# Patient Record
Sex: Male | Born: 1950 | Race: White | Hispanic: No | Marital: Married | Smoking: Never smoker
Health system: Southern US, Community
[De-identification: ages and names within clinical notes are randomized; demographics above are authoritative.]

## PROBLEM LIST (undated history)

## (undated) DIAGNOSIS — G4733 Obstructive sleep apnea (adult) (pediatric): Secondary | ICD-10-CM

## (undated) DIAGNOSIS — I10 Essential (primary) hypertension: Secondary | ICD-10-CM

## (undated) DIAGNOSIS — K219 Gastro-esophageal reflux disease without esophagitis: Secondary | ICD-10-CM

## (undated) DIAGNOSIS — D649 Anemia, unspecified: Secondary | ICD-10-CM

## (undated) DIAGNOSIS — F32A Depression, unspecified: Secondary | ICD-10-CM

## (undated) DIAGNOSIS — M1990 Unspecified osteoarthritis, unspecified site: Secondary | ICD-10-CM

## (undated) DIAGNOSIS — G2581 Restless legs syndrome: Secondary | ICD-10-CM

## (undated) DIAGNOSIS — E669 Obesity, unspecified: Secondary | ICD-10-CM

## (undated) DIAGNOSIS — R600 Localized edema: Secondary | ICD-10-CM

## (undated) DIAGNOSIS — F329 Major depressive disorder, single episode, unspecified: Secondary | ICD-10-CM

## (undated) DIAGNOSIS — I4891 Unspecified atrial fibrillation: Secondary | ICD-10-CM

## (undated) DIAGNOSIS — I499 Cardiac arrhythmia, unspecified: Secondary | ICD-10-CM

## (undated) DIAGNOSIS — C801 Malignant (primary) neoplasm, unspecified: Secondary | ICD-10-CM

## (undated) HISTORY — PX: KNEE SURGERY: SHX244

## (undated) HISTORY — PX: OTHER SURGICAL HISTORY: SHX169

## (undated) HISTORY — PX: BACK SURGERY: SHX140

---
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
## 2007-04-09 ENCOUNTER — Ambulatory Visit: Payer: Self-pay | Admitting: General Practice

## 2007-04-29 ENCOUNTER — Ambulatory Visit: Payer: Self-pay | Admitting: General Practice

## 2007-05-19 ENCOUNTER — Ambulatory Visit: Payer: Self-pay | Admitting: General Practice

## 2007-11-09 ENCOUNTER — Emergency Department: Payer: Self-pay | Admitting: Emergency Medicine

## 2007-11-09 ENCOUNTER — Other Ambulatory Visit: Payer: Self-pay

## 2008-03-29 ENCOUNTER — Ambulatory Visit: Payer: Self-pay | Admitting: General Practice

## 2008-04-25 ENCOUNTER — Ambulatory Visit: Payer: Self-pay | Admitting: General Practice

## 2009-09-12 ENCOUNTER — Ambulatory Visit: Payer: Self-pay | Admitting: General Practice

## 2010-03-15 ENCOUNTER — Ambulatory Visit: Payer: Self-pay | Admitting: General Practice

## 2012-09-12 ENCOUNTER — Emergency Department: Payer: Self-pay | Admitting: *Deleted

## 2016-02-04 ENCOUNTER — Inpatient Hospital Stay
Admission: EM | Admit: 2016-02-04 | Discharge: 2016-02-05 | DRG: 309 | Disposition: A | Discharge status: Home / Self Care | Payer: BLUE CROSS/BLUE SHIELD | Attending: Internal Medicine | Admitting: Internal Medicine

## 2016-02-04 ENCOUNTER — Encounter: Payer: Self-pay | Admitting: Emergency Medicine

## 2016-02-04 ENCOUNTER — Emergency Department: Payer: BLUE CROSS/BLUE SHIELD

## 2016-02-04 DIAGNOSIS — Z6841 Body Mass Index (BMI) 40.0 and over, adult: Secondary | ICD-10-CM

## 2016-02-04 DIAGNOSIS — R002 Palpitations: Secondary | ICD-10-CM | POA: Diagnosis present

## 2016-02-04 DIAGNOSIS — I493 Ventricular premature depolarization: Secondary | ICD-10-CM | POA: Diagnosis present

## 2016-02-04 DIAGNOSIS — G4733 Obstructive sleep apnea (adult) (pediatric): Secondary | ICD-10-CM | POA: Diagnosis present

## 2016-02-04 DIAGNOSIS — I10 Essential (primary) hypertension: Secondary | ICD-10-CM | POA: Diagnosis present

## 2016-02-04 DIAGNOSIS — I4891 Unspecified atrial fibrillation: Principal | ICD-10-CM | POA: Diagnosis present

## 2016-02-04 DIAGNOSIS — K219 Gastro-esophageal reflux disease without esophagitis: Secondary | ICD-10-CM | POA: Diagnosis present

## 2016-02-04 DIAGNOSIS — E669 Obesity, unspecified: Secondary | ICD-10-CM | POA: Diagnosis present

## 2016-02-04 DIAGNOSIS — F329 Major depressive disorder, single episode, unspecified: Secondary | ICD-10-CM | POA: Diagnosis present

## 2016-02-04 HISTORY — DX: Obstructive sleep apnea (adult) (pediatric): G47.33

## 2016-02-04 HISTORY — DX: Gastro-esophageal reflux disease without esophagitis: K21.9

## 2016-02-04 HISTORY — DX: Major depressive disorder, single episode, unspecified: F32.9

## 2016-02-04 HISTORY — DX: Depression, unspecified: F32.A

## 2016-02-04 LAB — COMPREHENSIVE METABOLIC PANEL
ALT: 18 U/L (ref 17–63)
AST: 23 U/L (ref 15–41)
Albumin: 4.1 g/dL (ref 3.5–5.0)
Alkaline Phosphatase: 69 U/L (ref 38–126)
Anion gap: 4 — ABNORMAL LOW (ref 5–15)
BUN: 15 mg/dL (ref 6–20)
CHLORIDE: 105 mmol/L (ref 101–111)
CO2: 27 mmol/L (ref 22–32)
Calcium: 8.8 mg/dL — ABNORMAL LOW (ref 8.9–10.3)
Creatinine, Ser: 1.14 mg/dL (ref 0.61–1.24)
Glucose, Bld: 109 mg/dL — ABNORMAL HIGH (ref 65–99)
POTASSIUM: 3.8 mmol/L (ref 3.5–5.1)
SODIUM: 136 mmol/L (ref 135–145)
Total Bilirubin: 1 mg/dL (ref 0.3–1.2)
Total Protein: 6.7 g/dL (ref 6.5–8.1)

## 2016-02-04 LAB — CBC
HEMATOCRIT: 50.6 % (ref 40.0–52.0)
Hemoglobin: 17.8 g/dL (ref 13.0–18.0)
MCH: 32.7 pg (ref 26.0–34.0)
MCHC: 35.1 g/dL (ref 32.0–36.0)
MCV: 93.2 fL (ref 80.0–100.0)
PLATELETS: 232 10*3/uL (ref 150–440)
RBC: 5.43 MIL/uL (ref 4.40–5.90)
RDW: 14.4 % (ref 11.5–14.5)
WBC: 7.5 10*3/uL (ref 3.8–10.6)

## 2016-02-04 LAB — TROPONIN I: Troponin I: <0.03 ng/mL (ref <0.031)

## 2016-02-04 LAB — LIPID PANEL
Cholesterol: 133 mg/dL (ref 0–200)
HDL: 32 mg/dL — ABNORMAL LOW (ref >40)
LDL CALC: 44 mg/dL (ref 0–99)
Total CHOL/HDL Ratio: 4.2 RATIO
Triglycerides: 285 mg/dL — ABNORMAL HIGH (ref <150)
VLDL: 57 mg/dL — ABNORMAL HIGH (ref 0–40)

## 2016-02-04 LAB — TSH: TSH: 3.486 u[IU]/mL (ref 0.350–4.500)

## 2016-02-04 LAB — APTT: APTT: 28 s (ref 24–36)

## 2016-02-04 LAB — PROTIME-INR
INR: 1.12
Prothrombin Time: 14.6 seconds (ref 11.4–15.0)

## 2016-02-04 MED ORDER — DILTIAZEM HCL 30 MG PO TABS
30.0000 mg | ORAL_TABLET | Freq: Four times a day (QID) | ORAL | Status: DC
  Administered 2016-02-04 – 2016-02-05 (×4): 30 mg via ORAL
  Filled 2016-02-04 (×3): qty 1

## 2016-02-04 MED ORDER — PANTOPRAZOLE SODIUM 40 MG PO TBEC
40.0000 mg | DELAYED_RELEASE_TABLET | Freq: Every day | ORAL | Status: DC
  Administered 2016-02-05: 40 mg via ORAL
  Filled 2016-02-04: qty 1

## 2016-02-04 MED ORDER — ONDANSETRON HCL 4 MG PO TABS: 4.0000 mg | ORAL_TABLET | Freq: Four times a day (QID) | ORAL | Status: DC | PRN

## 2016-02-04 MED ORDER — SENNOSIDES-DOCUSATE SODIUM 8.6-50 MG PO TABS: 1.0000 | ORAL_TABLET | Freq: Every evening | ORAL | Status: DC | PRN

## 2016-02-04 MED ORDER — ASPIRIN EC 325 MG PO TBEC: 975.0000 mg | DELAYED_RELEASE_TABLET | Freq: Every day | ORAL | Status: DC

## 2016-02-04 MED ORDER — ACETAMINOPHEN 650 MG RE SUPP: 650.0000 mg | Freq: Four times a day (QID) | RECTAL | Status: DC | PRN

## 2016-02-04 MED ORDER — DIGOXIN 0.25 MG/ML IJ SOLN: 0.2500 mg | Freq: Once | INTRAMUSCULAR | Status: DC

## 2016-02-04 MED ORDER — DILTIAZEM HCL 25 MG/5ML IV SOLN
25.0000 mg | Freq: Once | INTRAVENOUS | Status: AC
  Administered 2016-02-04: 25 mg via INTRAVENOUS
  Filled 2016-02-04: qty 5

## 2016-02-04 MED ORDER — SODIUM CHLORIDE 0.9% FLUSH
3.0000 mL | Freq: Two times a day (BID) | INTRAVENOUS | Status: DC
  Administered 2016-02-04 – 2016-02-05 (×2): 3 mL via INTRAVENOUS

## 2016-02-04 MED ORDER — ALUM & MAG HYDROXIDE-SIMETH 200-200-20 MG/5ML PO SUSP: 30.0000 mL | Freq: Four times a day (QID) | ORAL | Status: DC | PRN

## 2016-02-04 MED ORDER — HYDROCODONE-ACETAMINOPHEN 5-325 MG PO TABS: 1.0000 | ORAL_TABLET | ORAL | Status: DC | PRN

## 2016-02-04 MED ORDER — PANTOPRAZOLE SODIUM 40 MG PO TBEC: 40.0000 mg | DELAYED_RELEASE_TABLET | Freq: Every day | ORAL | Status: DC

## 2016-02-04 MED ORDER — ENOXAPARIN SODIUM 40 MG/0.4ML ~~LOC~~ SOLN
40.0000 mg | SUBCUTANEOUS | Status: DC
  Administered 2016-02-04: 40 mg via SUBCUTANEOUS
  Filled 2016-02-04: qty 0.4

## 2016-02-04 MED ORDER — BUPROPION HCL ER (XL) 300 MG PO TB24
300.0000 mg | ORAL_TABLET | Freq: Every day | ORAL | Status: DC
  Administered 2016-02-05: 300 mg via ORAL
  Filled 2016-02-04: qty 1

## 2016-02-04 MED ORDER — DILTIAZEM HCL ER 60 MG PO CP12
60.0000 mg | ORAL_CAPSULE | Freq: Two times a day (BID) | ORAL | Status: DC
  Filled 2016-02-04: qty 1

## 2016-02-04 MED ORDER — DILTIAZEM HCL 60 MG PO TABS
60.0000 mg | ORAL_TABLET | Freq: Once | ORAL | Status: DC
  Filled 2016-02-04: qty 2

## 2016-02-04 MED ORDER — ASPIRIN EC 325 MG PO TBEC
325.0000 mg | DELAYED_RELEASE_TABLET | Freq: Every day | ORAL | Status: DC
  Administered 2016-02-04: 325 mg via ORAL
  Filled 2016-02-04: qty 1

## 2016-02-04 MED ORDER — ONDANSETRON HCL 4 MG/2ML IJ SOLN: 4.0000 mg | Freq: Four times a day (QID) | INTRAMUSCULAR | Status: DC | PRN

## 2016-02-04 MED ORDER — DILTIAZEM HCL 100 MG IV SOLR
5.0000 mg/h | INTRAVENOUS | Status: DC
  Filled 2016-02-04: qty 100

## 2016-02-04 MED ORDER — ACETAMINOPHEN 325 MG PO TABS: 650.0000 mg | ORAL_TABLET | Freq: Four times a day (QID) | ORAL | Status: DC | PRN

## 2016-02-04 NOTE — ED Provider Notes (Signed)
[facility] Emergency Department Provider Note  ____________________________________________    I have reviewed the triage vital signs and the nursing notes.   HISTORY  Chief Complaint Palpitations    HPI Benjamin [name] is a 65 y.o. male who presents with complaints of heart palpitations. 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 This morning he felt unwell and relatively weak and dizzy and felt that his heart rate was faster than normal. He denies chest pain. He denies recent alcohol abuse. No recent travel. No calf pain or swelling. No shortness of breath.     Past Medical History  Diagnosis Date  . Depression   . GERD (gastroesophageal reflux disease)     There are no active problems to display for this patient.   History reviewed. No pertinent past surgical history.  Current Outpatient Rx  Name  Route  Sig  Dispense  Refill  . buPROPion (WELLBUTRIN XL) 150 MG 24 hr tablet   Oral   Take 300 mg by mouth daily.      4   . pantoprazole (PROTONIX) 40 MG tablet   Oral   Take 40 mg by mouth daily.      5     Allergies Review of patient's allergies indicates no known allergies.  History reviewed. No pertinent family history.  Social History Social History  Substance Use Topics  . Smoking status: Never Smoker   . Smokeless tobacco: None  . Alcohol Use: Yes    Review of Systems  Constitutional: Negative for fever. Eyes: Negative for redness ENT: Negative for sore throat Cardiovascular: Negative for chest pain. Positive for palpitations Respiratory: Negative for shortness of breath. Gastrointestinal: Negative for abdominal pain Genitourinary: Negative for dysuria. Musculoskeletal: Negative for back pain. Skin: Negative for rash. Neurological: Negative for focal weakness Psychiatric: no  anxiety    ____________________________________________   PHYSICAL EXAM:  VITAL SIGNS: ED Triage Vitals  Enc Vitals Group     BP 02/04/16 1409 178/99 mmHg     Pulse Rate 02/04/16 1409 167     Resp 02/04/16 1409 20     Temp 02/04/16 1409 98.2 F (36.8 C)     Temp Source 02/04/16 1409 Oral     SpO2 02/04/16 1409 99 %     Weight 02/04/16 1409 289 lb 6.4 oz (131.271 kg)     Height 02/04/16 1409 5\' 10"  (1.778 m)     Head Cir --      Peak Flow --      Pain Score --      Pain Loc --      Pain Edu? --      Excl. in [place]? --      Constitutional: Alert and oriented. Well appearing and in no distress.  Eyes: Conjunctivae are normal. No erythema or injection ENT   Head: Normocephalic and atraumatic.   Mouth/Throat: Mucous membranes are moist. Cardiovascular: Tachycardia, irregularly irregular rhythm..  symmetric distal pulses are present in the upper extremities. No murmurs or rubs  Respiratory: Normal respiratory effort without tachypnea nor retractions. Breath sounds are clear and equal bilaterally.  Gastrointestinal: Soft and non-tender in all quadrants. No distention. There is no CVA tenderness. Genitourinary: deferred Musculoskeletal: Nontender with normal range of motion in all extremities. No lower extremity tenderness nor edema. Neurologic:  Normal speech and language. No gross focal neurologic deficits are appreciated. Skin:  Skin is warm, dry  and intact. No rash noted. Psychiatric: Mood and affect are normal. Patient exhibits appropriate insight and judgment.  ____________________________________________    LABS (pertinent positives/negatives)  Labs Reviewed  COMPREHENSIVE METABOLIC PANEL - Abnormal; Notable for the following:    Glucose, Bld 109 (*)    Calcium 8.8 (*)    Anion gap 4 (*)    All other components within normal limits  CBC  TROPONIN I  APTT  PROTIME-INR    ____________________________________________   EKG  ED ECG REPORT I, Lavonia Drafts, the attending physician, personally viewed and interpreted this ECG.   Date: 02/04/2016  EKG Time: 2:13 PM  Rate: 152  Rhythm: atrial fibrillation, rate 152  Axis: Normal  Intervals:a fib  ST&T Change:  Nonspecific ____________________________________________    RADIOLOGY  Chest x-ray unremarkable  ____________________________________________   PROCEDURES  Procedure(s) performed: none  Critical Care performed: yes  CRITICAL CARE Performed by: Lavonia Drafts   Total critical care time: 30 minutes  Critical care time was exclusive of separately billable procedures and treating other patients.  Critical care was necessary to treat or prevent imminent or life-threatening deterioration.  Critical care was time spent personally by me on the following activities: development of treatment plan with patient and/or surrogate as well as nursing, discussions with consultants, evaluation of patient's response to treatment, examination of patient, obtaining history from patient or surrogate, ordering and performing treatments and interventions, ordering and review of laboratory studies, ordering and review of radiographic studies, pulse oximetry and re-evaluation of patient's condition.  ____________________________________________   INITIAL IMPRESSION / ASSESSMENT AND PLAN / ED COURSE  Pertinent labs & imaging results that were available during my care of the patient were reviewed by me and considered in my medical decision making (see chart for details).  Patient presents with weakness and dizziness and palpitations. EKG shows new onset atrial fibrillation with RVR. Cardizem 25 mg IV given with good reduction in rate but continues to be in atrial fibrillation. I will admit the patient to the hospital given unknown onset of atrial fibrillation.  ____________________________________________   FINAL CLINICAL IMPRESSION(S) / ED DIAGNOSES  Final diagnoses:  Atrial  fibrillation with RVR (HCC)          Lavonia Drafts, MD 02/04/16 1513

## 2016-02-04 NOTE — Progress Notes (Signed)
Per MD Mody do NOT continue 975 ASA in hospital, change to 325.  OK to give w/Lovenox

## 2016-02-04 NOTE — H&P (Signed)
Benjamin [name] at [place] NAME: Benjamin [name]    MR#:  [id number]  DATE OF BIRTH:  09-14-1951  DATE OF ADMISSION:  02/04/2016  PRIMARY CARE PHYSICIAN: Dr. Corinda Gub[facility] at [facility]   REQUESTING/REFERRING PHYSICIAN: Dr Corky Downs  CHIEF COMPLAINT:  New onset atrial fibrillation HISTORY OF PRESENT ILLNESS:  Benjamin [name]  is a 65 y.o. male with a known history of Depression and OSA on CPAP who presents with above complaint. Patient reports that this morning he thought he had PVCs as he does have a history of PVCs. He was feeling dizzy and felt like his heart was having palpitations. He presents to the ER where he was diagnosed with new onset atrial fibrillation. His heart rates were in the 160s and has been given IV diltiazem. His heart rate is less than 100 currently. Patient denies chest pain, shortness of breath, recent illness.  PAST MEDICAL HISTORY:   Past Medical History  Diagnosis Date  . Depression   . GERD (gastroesophageal reflux disease)   OSA  PAST SURGICAL HISTORY:  None  SOCIAL HISTORY:   Social History  Substance Use Topics  . Smoking status: Never Smoker   . Smokeless tobacco: no  . Alcohol Use: Yes    FAMILY HISTORY:  Family with cancer  DRUG ALLERGIES:  No Known Allergies   REVIEW OF SYSTEMS:  CONSTITUTIONAL: No fever, fatigue or weakness.  EYES: No blurred or double vision.  EARS, NOSE, AND THROAT: No tinnitus or ear pain.  RESPIRATORY: No cough, shortness of breath, wheezing or hemoptysis.  CARDIOVASCULAR: No chest pain, orthopnea, edema. Positive palpitations with dizziness GASTROINTESTINAL: No nausea, vomiting, diarrhea or abdominal pain.  GENITOURINARY: No dysuria, hematuria.  ENDOCRINE: No polyuria, nocturia,  HEMATOLOGY: No anemia, easy bruising or bleeding SKIN: No rash or lesion. MUSCULOSKELETAL: No joint pain or arthritis.   NEUROLOGIC: No tingling, numbness, weakness.  PSYCHIATRY: No  anxiety or depression.   MEDICATIONS AT HOME:   Prior to Admission medications   Medication Sig Start Date End Date Taking? Authorizing Provider  aspirin 325 MG EC tablet Take 975 mg by mouth at bedtime.   Yes Historical Provider, MD  buPROPion (WELLBUTRIN XL) 150 MG 24 hr tablet Take 300 mg by mouth daily. 01/20/16  Yes Historical Provider, MD  ibuprofen (ADVIL,MOTRIN) 200 MG tablet Take 600 mg by mouth every morning.   Yes Historical Provider, MD  pantoprazole (PROTONIX) 40 MG tablet Take 40 mg by mouth daily. 01/23/16  Yes Historical Provider, MD      VITAL SIGNS:  Blood pressure 178/99, pulse 167, temperature 98.2 F (36.8 C), temperature source Oral, resp. rate 20, height 5\' 10"  (1.778 m), weight 131.271 kg (289 lb 6.4 oz), SpO2 99 %.  PHYSICAL EXAMINATION:  GENERAL:  65 y.o.-year-old patient lying in the bed with no acute distress.  EYES: Pupils equal, round, reactive to light and accommodation. No scleral icterus. Extraocular muscles intact.  HEENT: Head atraumatic, normocephalic. Oropharynx and nasopharynx clear.  NECK:  Supple, no jugular venous distention. No thyroid enlargement, no tenderness.  LUNGS: Normal breath sounds bilaterally, no wheezing, rales,rhonchi or crepitation. No use of accessory muscles of respiration.  CARDIOVASCULAR: Irregular, irregular No murmurs, rubs, or gallops.  ABDOMEN: Soft, nontender, nondistended. Bowel sounds present. No organomegaly or mass.  EXTREMITIES: No pedal edema, cyanosis, or clubbing.  NEUROLOGIC: Cranial nerves II through XII are grossly intact. No focal deficits. PSYCHIATRIC: The patient is alert and oriented x 3.  SKIN: No obvious  rash, lesion, or ulcer.   LABORATORY PANEL:   CBC  Recent Labs Lab 02/04/16 1415  WBC 7.5  HGB 17.8  HCT 50.6  PLT 232   ------------------------------------------------------------------------------------------------------------------  Chemistries   Recent Labs Lab 02/04/16 1415  NA 136  K  3.8  CL 105  CO2 27  GLUCOSE 109*  BUN 15  CREATININE 1.14  CALCIUM 8.8*  AST 23  ALT 18  ALKPHOS 69  BILITOT 1.0   ------------------------------------------------------------------------------------------------------------------  Cardiac Enzymes  Recent Labs Lab 02/04/16 1415  TROPONINI <0.03   ------------------------------------------------------------------------------------------------------------------  RADIOLOGY:  Dg Chest Portable 1 View  02/04/2016  CLINICAL DATA:  Atrial fibrillation EXAM: PORTABLE CHEST 1 VIEW COMPARISON:  None. FINDINGS: The heart size and mediastinal contours are within normal limits. Both lungs are clear. The visualized skeletal structures are unremarkable. IMPRESSION: No active disease. Electronically Signed   By: Inez Catalina M.D.   On: 02/04/2016 14:59    EKG:   A fib HR 152 No ST elevation or depression  IMPRESSION AND PLAN:   65 y/o male with OSA and depression here with new onset Atrial Fibrillation.  1. New onset Atrial Fibrillation: Patient has responded to IV diltiazem. By mouth diltiazem, if needed then will need to use IV diltiazem Order echocardiogram. Ordered TSH. CHADS  score of 0  2. OSA: Patient failed bring in CPAP.  3. Depression: Continue Wellbutrin.  4. GERD continue PPI.1 All the records are reviewed and case discussed with ED provider. Management plans discussed with the patient and  is in agreement.  CODE STATUS: FULL  TOTAL TIME TAKING CARE OF THIS PATIENT: 50 minutes.    Graci Hulce M.D on 02/04/2016 at 3:53 PM  Between 7am to 6pm - Pager - [phone] After 6pm go to [email or web address] - password [email or web address] [facility]  Office  [phone]  CC: Primary care physician; Pcp Not In System

## 2016-02-04 NOTE — Progress Notes (Signed)
Per Mody stop SR Cardizem and start 30 mg Q6H, give first dose now for elevated HR

## 2016-02-04 NOTE — ED Notes (Signed)
Pt presents to ED with heart palpitations that started last night. Pt states "it was doing this when I woke up this morning". Pt states he has dizziness when this happens. Pt is a/o x4, in  No acute distress

## 2016-02-05 ENCOUNTER — Inpatient Hospital Stay
Admit: 2016-02-05 | Discharge: 2016-02-05 | Disposition: A | Discharge status: Home / Self Care | Payer: BLUE CROSS/BLUE SHIELD | Attending: Internal Medicine | Admitting: Internal Medicine

## 2016-02-05 LAB — BASIC METABOLIC PANEL
Anion gap: 2 — ABNORMAL LOW (ref 5–15)
BUN: 13 mg/dL (ref 6–20)
CHLORIDE: 107 mmol/L (ref 101–111)
CO2: 28 mmol/L (ref 22–32)
CREATININE: 1.15 mg/dL (ref 0.61–1.24)
Calcium: 8.4 mg/dL — ABNORMAL LOW (ref 8.9–10.3)
GFR calc Af Amer: >60 mL/min (ref >60)
GFR calc non Af Amer: >60 mL/min (ref >60)
Glucose, Bld: 99 mg/dL (ref 65–99)
Potassium: 3.8 mmol/L (ref 3.5–5.1)
SODIUM: 137 mmol/L (ref 135–145)

## 2016-02-05 LAB — ECHOCARDIOGRAM COMPLETE
Height: 70 in
Weight: 4468.8 oz

## 2016-02-05 LAB — TROPONIN I: Troponin I: <0.03 ng/mL (ref <0.031)

## 2016-02-05 MED ORDER — DILTIAZEM HCL ER COATED BEADS 120 MG PO CP24
120.0000 mg | ORAL_CAPSULE | Freq: Every day | ORAL | Status: DC
  Administered 2016-02-05: 120 mg via ORAL
  Filled 2016-02-05: qty 1

## 2016-02-05 MED ORDER — DILTIAZEM HCL ER COATED BEADS 120 MG PO CP24: 120.0000 mg | ORAL_CAPSULE | Freq: Every day | ORAL | Status: DC

## 2016-02-05 MED ORDER — ENOXAPARIN SODIUM 40 MG/0.4ML ~~LOC~~ SOLN
40.0000 mg | Freq: Two times a day (BID) | SUBCUTANEOUS | Status: DC
  Administered 2016-02-05: 40 mg via SUBCUTANEOUS
  Filled 2016-02-05: qty 0.4

## 2016-02-05 NOTE — Consult Note (Signed)
Reason for Consult: AFIB new Referring Physician: Dr Benjie Karvonen hospitalist/[facility] primary  Benjamin [name] is an 65 y.o. male.  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 Pt c/o of recent palpitation sweating and SOB. He also c/o CP. He came to the hospital and was found to have AFIB RVR.  He has a history of PVCs which got better with CPAP. He has had multiple episodes of sweating. The pt has had possible RLS. He feels better now. Vertigo has been recurrent over the last few weeks. He c/o of chest pressure in mid chest.  Past Medical History  Diagnosis Date  . Depression   . GERD (gastroesophageal reflux disease)   . OSA (obstructive sleep apnea)     History reviewed. No pertinent past surgical history.  History reviewed. No pertinent family history.  Social History:  reports that he has never smoked. He does not have any smokeless tobacco history on file. He reports that he drinks alcohol. He reports that he does not use illicit drugs.  Allergies: No Known Allergies  Medications: I have reviewed the patient's current medications.  Results for orders placed or performed during the hospital encounter of 02/04/16 (from the past 48 hour(s))  CBC     Status: None   Collection Time: 02/04/16  2:15 PM  Result Value Ref Range   WBC 7.5 3.8 - 10.6 K/uL   RBC 5.43 4.40 - 5.90 MIL/uL   Hemoglobin 17.8 13.0 - 18.0 g/dL   HCT 50.6 40.0 - 52.0 %   MCV 93.2 80.0 - 100.0 fL   MCH 32.7 26.0 - 34.0 pg   MCHC 35.1 32.0 - 36.0 g/dL   RDW 14.4 11.5 - 14.5 %   Platelets 232 150 - 440 K/uL  Comprehensive metabolic panel     Status: Abnormal   Collection Time: 02/04/16  2:15 PM  Result Value Ref Range   Sodium 136 135 - 145 mmol/L   Potassium 3.8 3.5 - 5.1 mmol/L   Chloride 105 101 - 111 mmol/L   CO2 27 22 - 32 mmol/L   Glucose, Bld 109 (H) 65 - 99 mg/dL   BUN 15 6 - 20 mg/dL   Creatinine, Ser 1.14 0.61 - 1.24 mg/dL   Calcium 8.8 (L) 8.9 - 10.3  mg/dL   Total Protein 6.7 6.5 - 8.1 g/dL   Albumin 4.1 3.5 - 5.0 g/dL   AST 23 15 - 41 U/L   ALT 18 17 - 63 U/L   Alkaline Phosphatase 69 38 - 126 U/L   Total Bilirubin 1.0 0.3 - 1.2 mg/dL   GFR calc non Af Amer >60 >60 mL/min   GFR calc Af Amer >60 >60 mL/min    Comment: (NOTE) The eGFR has been calculated using the CKD EPI equation. This calculation has not been validated in all clinical situations. eGFR's persistently <60 mL/min signify possible Chronic Kidney Disease.    Anion gap 4 (L) 5 - 15  Troponin I     Status: None   Collection Time: 02/04/16  2:15 PM  Result Value Ref Range   Troponin I <0.03 <0.031 ng/mL    Comment:        NO INDICATION OF MYOCARDIAL INJURY.   APTT     Status: None   Collection Time: 02/04/16  2:15 PM  Result Value Ref Range   aPTT 28 24 - 36 seconds  Protime-INR     Status: None  Collection Time: 02/04/16  2:15 PM  Result Value Ref Range   Prothrombin Time 14.6 11.4 - 15.0 seconds   INR 1.12   TSH     Status: None   Collection Time: 02/04/16  4:58 PM  Result Value Ref Range   TSH 3.486 0.350 - 4.500 uIU/mL  Troponin I     Status: None   Collection Time: 02/04/16  4:58 PM  Result Value Ref Range   Troponin I <0.03 <0.031 ng/mL    Comment:        NO INDICATION OF MYOCARDIAL INJURY.   Lipid panel     Status: Abnormal   Collection Time: 02/04/16  4:58 PM  Result Value Ref Range   Cholesterol 133 0 - 200 mg/dL   Triglycerides 285 (H) <150 mg/dL   HDL 32 (L) >40 mg/dL   Total CHOL/HDL Ratio 4.2 RATIO   VLDL 57 (H) 0 - 40 mg/dL   LDL Cholesterol 44 0 - 99 mg/dL    Comment:        Total Cholesterol/HDL:CHD Risk Coronary Heart Disease Risk Table                     Men   Women  1/2 Average Risk   3.4   3.3  Average Risk       5.0   4.4  2 X Average Risk   9.6   7.1  3 X Average Risk  23.4   11.0        Use the calculated Patient Ratio above and the CHD Risk Table to determine the patient's CHD Risk.        ATP III  CLASSIFICATION (LDL):  <100     mg/dL   Optimal  100-129  mg/dL   Near or Above                    Optimal  130-159  mg/dL   Borderline  160-189  mg/dL   High  >190     mg/dL   Very High   Troponin I     Status: None   Collection Time: 02/04/16 10:50 PM  Result Value Ref Range   Troponin I <0.03 <0.031 ng/mL    Comment:        NO INDICATION OF MYOCARDIAL INJURY.   Troponin I     Status: None   Collection Time: 02/05/16  4:28 AM  Result Value Ref Range   Troponin I <0.03 <0.031 ng/mL    Comment:        NO INDICATION OF MYOCARDIAL INJURY.   Basic metabolic panel     Status: Abnormal   Collection Time: 02/05/16  4:28 AM  Result Value Ref Range   Sodium 137 135 - 145 mmol/L    Comment: RESULTS VERIFIED BY REPEAT TESTING   Potassium 3.8 3.5 - 5.1 mmol/L   Chloride 107 101 - 111 mmol/L   CO2 28 22 - 32 mmol/L   Glucose, Bld 99 65 - 99 mg/dL   BUN 13 6 - 20 mg/dL   Creatinine, Ser 1.15 0.61 - 1.24 mg/dL   Calcium 8.4 (L) 8.9 - 10.3 mg/dL   GFR calc non Af Amer >60 >60 mL/min   GFR calc Af Amer >60 >60 mL/min    Comment: (NOTE) The eGFR has been calculated using the CKD EPI equation. This calculation has not been validated in all clinical situations. eGFR's persistently <60 mL/min signify possible Chronic Kidney Disease.  Anion gap 2 (L) 5 - 15    Dg Chest Portable 1 View  02/04/2016  CLINICAL DATA:  Atrial fibrillation EXAM: PORTABLE CHEST 1 VIEW COMPARISON:  None. FINDINGS: The heart size and mediastinal contours are within normal limits. Both lungs are clear. The visualized skeletal structures are unremarkable. IMPRESSION: No active disease. Electronically Signed   By: Inez Catalina M.D.   On: 02/04/2016 14:59    Review of Systems  Constitutional: Positive for malaise/fatigue and diaphoresis.  HENT: Positive for congestion.   Eyes: Negative.   Respiratory: Positive for shortness of breath.   Cardiovascular: Positive for palpitations, orthopnea, claudication and leg  swelling.  Gastrointestinal: Positive for heartburn.  Genitourinary: Negative.   Musculoskeletal: Negative.   Skin: Negative.   Neurological: Positive for dizziness, weakness and headaches.  Endo/Heme/Allergies: Negative.   Psychiatric/Behavioral: Positive for depression. The patient has insomnia.    Blood pressure 113/70, pulse 79, temperature 98.2 F (36.8 C), temperature source Oral, resp. rate 16, height '5\' 10"'  (1.778 m), weight 126.69 kg (279 lb 4.8 oz), SpO2 98 %. Physical Exam  Nursing note and vitals reviewed. Constitutional: He is oriented to person, place, and time. He appears well-developed and well-nourished.  HENT:  Head: Normocephalic and atraumatic.  Eyes: Conjunctivae and EOM are normal. Pupils are equal, round, and reactive to light.  Neck: Normal range of motion. Neck supple.  Cardiovascular: S1 normal, S2 normal and normal pulses.  An irregularly irregular rhythm present. Tachycardia present.  Exam reveals gallop and S4.   Murmur heard.  Systolic murmur is present with a grade of 2/6  Respiratory: Effort normal and breath sounds normal.  GI: Soft. Bowel sounds are normal.  Musculoskeletal: Normal range of motion.  Neurological: He is alert and oriented to person, place, and time. He has normal reflexes.  Skin: Skin is warm and dry.  Psychiatric: He has a normal mood and affect.    Assessment/Plan: AFIB- new Chest pain HTN Obesity GERD OSA Anxiety Depression . PLAN ROMI Continue tele Diltazem for AFIB rate HTN control with diltazem Protonix for GER Weight loss exercise portion control Continue Wellbutrin daily DVT prophylaxis with Lovenox CPAP for OSA rec weight loss Agree with ASA 81 mg po qd   CALLWOOD,DWAYNE D. 02/05/2016, 12:22 PM

## 2016-02-05 NOTE — Plan of Care (Signed)
Problem: Activity: Goal: Ability to tolerate increased activity will improve Outcome: Not Progressing Remains on bathroom priv  Problem: Cardiac: Goal: Ability to achieve and maintain adequate cardiopulmonary perfusion will improve Outcome: Progressing Pt converted to NSR  Problem: Pain Managment: Goal: General experience of comfort will improve Outcome: Progressing Prn medications  Problem: Tissue Perfusion: Goal: Risk factors for ineffective tissue perfusion will decrease Outcome: Progressing SQ Lovenox

## 2016-02-05 NOTE — Care Management (Signed)
No discharge needs identified 

## 2016-02-05 NOTE — Consult Note (Signed)
[facility] Consultation Note  Patient ID: Benjamin [name], MRN: [id number], DOB/AGE: 1951/01/26 65 y.o. Admit date: 02/04/2016   Date of Consult: 02/05/2016 Primary Physician: Pcp Not In System Primary Cardiologist: None  Chief Complaint:  Chief Complaint  Patient presents with  . Palpitations   Reason for Consult: atrial fibrillation with rapid ventricular rate  HPI: 65 y.o. male with known depression obstructive sleep apnea on appropriate CPAP machine and borderline hypertension who is had new onset of weakness fatigue shortness of breath palpitations irregular heart beat with slight dizziness and diaphoresis for which she was seen in the emergency room. At that time EKG showed atrial fibrillation with very rapid rate of the 160 bpm. At that time he had no evidence of chest pain. Patient was placed on appropriate medication management including diltiazem with a conversion to normal sinus rhythm and now an EKG showing normal sinus rhythm without other EKG changes. Troponin has been normal and no evidence of myocardial infarction. The patient feels much better at this time and is stable.  Past Medical History  Diagnosis Date  . Depression   . GERD (gastroesophageal reflux disease)   . OSA (obstructive sleep apnea)       Surgical History: History reviewed. No pertinent past surgical history.   Home Meds: Prior to Admission medications   Medication Sig Start Date End Date Taking? Authorizing Provider  aspirin 325 MG EC tablet Take 975 mg by mouth at bedtime.   Yes Historical Provider, MD  buPROPion (WELLBUTRIN XL) 150 MG 24 hr tablet Take 300 mg by mouth daily. 01/20/16  Yes Historical Provider, MD  ibuprofen (ADVIL,MOTRIN) 200 MG tablet Take 600 mg by mouth every morning.   Yes Historical Provider, MD  pantoprazole (PROTONIX) 40 MG tablet Take 40 mg by mouth daily. 01/23/16  Yes Historical Provider, MD    Inpatient Medications:  . aspirin EC  325 mg Oral QHS  .  buPROPion  300 mg Oral Daily  . digoxin  0.25 mg Intravenous Once  . diltiazem  30 mg Oral 4 times per day  . diltiazem  60 mg Oral Once  . enoxaparin (LOVENOX) injection  40 mg Subcutaneous Q12H  . pantoprazole  40 mg Oral QAC breakfast  . sodium chloride flush  3 mL Intravenous Q12H      Allergies: No Known Allergies  Social History   Social History  . Marital Status: Married    Spouse Name: N/A  . Number of Children: N/A  . Years of Education: N/A   Occupational History  . Not on file.   Social History Main Topics  . Smoking status: Never Smoker   . Smokeless tobacco: Not on file  . Alcohol Use: Yes  . Drug Use: No  . Sexual Activity: Not Currently   Other Topics Concern  . Not on file   Social History Narrative  . No narrative on file     History reviewed. No pertinent family history.   Review of Systems Positive for palpitations Negative for: General:  chills, fever, night sweats or weight changes.  Cardiovascular: PND orthopnea syncope dizziness  Dermatological skin lesions rashes Respiratory: Cough congestion Urologic: Frequent urination urination at night and hematuria Abdominal: negative for nausea, vomiting, diarrhea, bright red blood per rectum, melena, or hematemesis Neurologic: negative for visual changes, and/or hearing changes  All other systems reviewed and are otherwise negative except as noted above.  Labs:  Recent Labs  02/04/16 1415 02/04/16 1658 02/04/16 2250 02/05/16 [id number]  TROPONINI <0.03 <0.03 <0.03 <0.03   Lab Results  Component Value Date   WBC 7.5 02/04/2016   HGB 17.8 02/04/2016   HCT 50.6 02/04/2016   MCV 93.2 02/04/2016   PLT 232 02/04/2016    Recent Labs Lab 02/04/16 1415 02/05/16 0428  NA 136 137  K 3.8 3.8  CL 105 107  CO2 27 28  BUN 15 13  CREATININE 1.14 1.15  CALCIUM 8.8* 8.4*  PROT 6.7  --   BILITOT 1.0  --   ALKPHOS 69  --   ALT 18  --   AST 23  --   GLUCOSE 109* 99   Lab Results  Component  Value Date   CHOL 133 02/04/2016   HDL 32* 02/04/2016   LDLCALC 44 02/04/2016   TRIG 285* 02/04/2016   No results found for: DDIMER  Radiology/Studies:  Dg Chest Portable 1 View  02/04/2016  CLINICAL DATA:  Atrial fibrillation EXAM: PORTABLE CHEST 1 VIEW COMPARISON:  None. FINDINGS: The heart size and mediastinal contours are within normal limits. Both lungs are clear. The visualized skeletal structures are unremarkable. IMPRESSION: No active disease. Electronically Signed   By: Inez Catalina M.D.   On: 02/04/2016 14:59    EKG: Atrial fibrillation with rapid ventricular rate  Weights: Filed Weights   02/04/16 1409 02/04/16 1655  Weight: 289 lb 6.4 oz (131.271 kg) 279 lb 4.8 oz (126.69 kg)     Physical Exam: Blood pressure 113/70, pulse 79, temperature 98.2 F (36.8 C), temperature source Oral, resp. rate 16, height 5\' 10"  (1.778 m), weight 279 lb 4.8 oz (126.69 kg), SpO2 98 %. Body mass index is 40.08 kg/(m^2). General: Well developed, well nourished, in no acute distress. Head eyes ears nose throat: Normocephalic, atraumatic, sclera non-icteric, no xanthomas, nares are without discharge. No apparent thyromegaly and/or mass  Lungs: Normal respiratory effort.  no wheezes, no rales, no rhonchi.  Heart: RRR with normal S1 S2. no murmur gallop, no rub, PMI is normal size and placement, carotid upstroke normal without bruit, jugular venous pressure is normal Abdomen: Soft, non-tender, non-distended with normoactive bowel sounds. No hepatomegaly. No rebound/guarding. No obvious abdominal masses. Abdominal aorta is normal size without bruit Extremities: No edema. no cyanosis, no clubbing, no ulcers  Peripheral : 2+ bilateral upper extremity pulses, 2+ bilateral femoral pulses, 2+ bilateral dorsal pedal pulse Neuro: Alert and oriented. No facial asymmetry. No focal deficit. Moves all extremities spontaneously. Musculoskeletal: Normal muscle tone without kyphosis Psych:  Responds to questions  appropriately with a normal affect.    Assessment: 65 year old male with sleep apnea and borderline hypertension with acute onset of atrial fibrillation nonvalvular in nature with rapid ventricular rate now spontaneously converted to normal sinus rhythm without evidence of myocardial infarction and or heart failure  Plan: 1. Continue diltiazem and digoxin for heart rate control and maintenance of normal sinus rhythm and changed to longer acting pill 2. Aspirin for further risk reduction in stroke with atrial fibrillation with a low CHA DS score and maintaining normal rhythm at this time 3. Begin ambulation and follow for further significant cardiovascular symptoms and possible discharge to home today with follow-up next week for further adjustments of medication management  Signed, Corey Skains M.D. [facility] 02/05/2016, 12:51 PM

## 2016-02-05 NOTE — Progress Notes (Signed)
Dr. Vianne Bulls updated on pt's echocardiogram results, orders to discharge.

## 2016-02-05 NOTE — Progress Notes (Signed)
Pt discharged to home via wc.  Instructions  given to pt.  Questions answered.  No distress.  

## 2016-02-05 NOTE — Progress Notes (Signed)
Anticoagulation monitoring(Lovenox):  65yo M ordered Lovenox 40 mg Q24h  Filed Weights   02/04/16 1409 02/04/16 1655  Weight: 289 lb 6.4 oz (131.271 kg) 279 lb 4.8 oz (126.69 kg)   BMI 40.2   Lab Results  Component Value Date   CREATININE 1.15 02/05/2016   CREATININE 1.14 02/04/2016   Estimated Creatinine Clearance: 87.9 mL/min (by C-G formula based on Cr of 1.15). Hemoglobin & Hematocrit     Component Value Date/Time   HGB 17.8 02/04/2016 1415   HCT 50.6 02/04/2016 1415   Lab Results  Component Value Date   PLT 232 02/04/2016    Per Protocol for Patient with estCrcl> 30 ml/min and BMI > 40, will transition to Lovenox 40 mg Q12h.     Chinita Greenland PharmD Clinical Pharmacist 02/05/2016

## 2016-02-06 DIAGNOSIS — G2581 Restless legs syndrome: Secondary | ICD-10-CM | POA: Insufficient documentation

## 2016-02-06 DIAGNOSIS — R251 Tremor, unspecified: Secondary | ICD-10-CM | POA: Insufficient documentation

## 2016-02-06 NOTE — Discharge Summary (Signed)
Benjamin [name], is a 65 y.o. male  DOB 1950-12-24  MRN [id number].  Admission date:  02/04/2016  Admitting Physician  Bettey Costa, MD  Discharge Date:  02/05/2016   Primary MD  Pcp Not In System  Recommendations for primary care physician for things to follow:  Follow up with [facility] in one week    Admission Diagnosis  Atrial fibrillation with RVR ([place]) [I48.91]   Discharge Diagnosis  Atrial fibrillation with RVR ([place]) [I48.91]   Active Problems:   Atrial fibrillation [facility])      Past Medical History  Diagnosis Date  . Depression   . GERD (gastroesophageal reflux disease)   . OSA (obstructive sleep apnea)     History reviewed. No pertinent past surgical history.     History of present illness and  Hospital Course:     Kindly see H&P for history of present illness and admission details, please review complete Labs, Consult reports and Test reports for all details in brief  HPI  from the history and physical done on the day of admission 65 yr old  Male admitted for afib with RVR.he felt dizzy and palpitations.received IV Cardizem in ER and admitted to [place] onset afib with RVR;did not require IV Cardizem drip.started on Cardizem 30  Mg po q 6 hrs.seen by Dr.Kowalski,he reccommended cardizem cd 120 mg daily and ASA 325 mg daily.He Also received IV digoxin one dose,pt HR well controlled and we discharged him home with Cardizem CD 120 mg daily . Follow up with cardio in one week.Echo  Showed EF more than 55% 2.h/o depression;continue  Bupropion 3.OSA;Continue cpap at night.   Discharge Condition:stable   Follow UP  Follow-up Information    Follow up with Corey Skains, MD. Schedule an appointment as soon as possible for a visit in 1 week.   Specialty:  Internal  Medicine   Contact information:   [facility] [place] [address] [phone]         Discharge Instructions  and  Discharge Medications       Medication List    TAKE these medications        aspirin 325 MG EC tablet  Take 975 mg by mouth at bedtime.     buPROPion 150 MG 24 hr tablet  Commonly known as:  WELLBUTRIN XL  Take 300 mg by mouth daily.     diltiazem 120 MG 24 hr capsule  Commonly known as:  CARDIZEM CD  Take 1 capsule (120 mg total) by mouth daily.     ibuprofen 200 MG tablet  Commonly known as:  ADVIL,MOTRIN  Take 600 mg by mouth every morning.     pantoprazole 40 MG tablet  Commonly known as:  PROTONIX  Take 40 mg by mouth daily.          Diet and Activity recommendation: See Discharge Instructions above   Consults obtained -cardiology   Major procedures and Radiology Reports - PLEASE review detailed and final reports for all details, in brief -     Dg Chest Portable 1 View  02/04/2016  CLINICAL DATA:  Atrial fibrillation EXAM: PORTABLE CHEST 1 VIEW COMPARISON:  None. FINDINGS: The heart size and mediastinal contours are within normal limits. Both lungs are clear. The visualized skeletal structures are unremarkable. IMPRESSION: No active disease. Electronically Signed   By: Inez Catalina M.D.   On: 02/04/2016 14:59    Micro Results  No results found for this or any previous visit (from the past 240 hour(s)).     Today   Subjective:   [name] today has no headache,no chest abdominal pain,no new weakness tingling or numbness, feels much better wants to go home today.   Objective:   Blood pressure 113/70, pulse 79, temperature 98.2 F (36.8 C), temperature source Oral, resp. rate 16, height 5\' 10"  (1.778 m), weight 126.69 kg (279 lb 4.8 oz), SpO2 98 %.  No intake or output data in the 24 hours ending 02/06/16 2210  Exam Awake Alert, Oriented x 3, No new F.N deficits, Normal  affect [place].AT,PERRAL Supple Neck,No JVD, No cervical lymphadenopathy appriciated.  Symmetrical Chest wall movement, Good air movement bilaterally, CTAB RRR,No Gallops,Rubs or new Murmurs, No Parasternal Heave +ve B.Sounds, Abd Soft, Non tender, No organomegaly appriciated, No rebound -guarding or rigidity. No Cyanosis, Clubbing or edema, No new Rash or bruise  Data Review   CBC w Diff:  Lab Results  Component Value Date   WBC 7.5 02/04/2016   HGB 17.8 02/04/2016   HCT 50.6 02/04/2016   PLT 232 02/04/2016    CMP:  Lab Results  Component Value Date   NA 137 02/05/2016   K 3.8 02/05/2016   CL 107 02/05/2016   CO2 28 02/05/2016   BUN 13 02/05/2016   CREATININE 1.15 02/05/2016   PROT 6.7 02/04/2016   ALBUMIN 4.1 02/04/2016   BILITOT 1.0 02/04/2016   ALKPHOS 69 02/04/2016   AST 23 02/04/2016   ALT 18 02/04/2016  .   Total Time in preparing paper work, data evaluation and todays exam - 51 minutes  Danner Paulding M.D on 02/05/2016 at 10:10 PM    Note: This dictation was prepared with Dragon dictation along with smaller phrase technology. Any transcriptional errors that result from this process are unintentional.

## 2016-02-13 DIAGNOSIS — G4733 Obstructive sleep apnea (adult) (pediatric): Secondary | ICD-10-CM | POA: Insufficient documentation

## 2016-02-13 DIAGNOSIS — I480 Paroxysmal atrial fibrillation: Secondary | ICD-10-CM | POA: Insufficient documentation

## 2016-02-13 DIAGNOSIS — E782 Mixed hyperlipidemia: Secondary | ICD-10-CM | POA: Insufficient documentation

## 2016-03-12 DIAGNOSIS — E538 Deficiency of other specified B group vitamins: Secondary | ICD-10-CM | POA: Insufficient documentation

## 2016-04-05 ENCOUNTER — Ambulatory Visit: Payer: BLUE CROSS/BLUE SHIELD | Attending: Otolaryngology

## 2016-04-05 DIAGNOSIS — G4733 Obstructive sleep apnea (adult) (pediatric): Secondary | ICD-10-CM | POA: Diagnosis not present

## 2016-04-05 DIAGNOSIS — R0683 Snoring: Secondary | ICD-10-CM | POA: Diagnosis not present

## 2016-04-05 DIAGNOSIS — G2581 Restless legs syndrome: Secondary | ICD-10-CM | POA: Diagnosis not present

## 2016-07-17 ENCOUNTER — Other Ambulatory Visit: Payer: Self-pay | Admitting: Physician Assistant

## 2016-08-27 ENCOUNTER — Other Ambulatory Visit: Payer: Self-pay | Admitting: Physician Assistant

## 2016-10-21 DIAGNOSIS — Z6841 Body Mass Index (BMI) 40.0 and over, adult: Secondary | ICD-10-CM | POA: Insufficient documentation

## 2017-01-28 DIAGNOSIS — Z79890 Hormone replacement therapy (postmenopausal): Secondary | ICD-10-CM | POA: Insufficient documentation

## 2017-01-29 DIAGNOSIS — N289 Disorder of kidney and ureter, unspecified: Secondary | ICD-10-CM | POA: Insufficient documentation

## 2017-01-31 DIAGNOSIS — Z9889 Other specified postprocedural states: Secondary | ICD-10-CM | POA: Insufficient documentation

## 2017-01-31 DIAGNOSIS — M48062 Spinal stenosis, lumbar region with neurogenic claudication: Secondary | ICD-10-CM | POA: Insufficient documentation

## 2017-01-31 DIAGNOSIS — M4316 Spondylolisthesis, lumbar region: Secondary | ICD-10-CM | POA: Insufficient documentation

## 2017-01-31 DIAGNOSIS — M5431 Sciatica, right side: Secondary | ICD-10-CM | POA: Insufficient documentation

## 2018-01-19 DIAGNOSIS — M5106 Intervertebral disc disorders with myelopathy, lumbar region: Secondary | ICD-10-CM | POA: Insufficient documentation

## 2018-04-07 DIAGNOSIS — K284 Chronic or unspecified gastrojejunal ulcer with hemorrhage: Secondary | ICD-10-CM | POA: Insufficient documentation

## 2018-05-06 ENCOUNTER — Emergency Department: Payer: Medicare Other

## 2018-05-06 ENCOUNTER — Observation Stay
Admission: EM | Admit: 2018-05-06 | Discharge: 2018-05-07 | Disposition: A | Discharge status: Home / Self Care | Payer: Medicare Other | Attending: Internal Medicine | Admitting: Internal Medicine

## 2018-05-06 ENCOUNTER — Other Ambulatory Visit: Payer: Self-pay

## 2018-05-06 ENCOUNTER — Observation Stay
Admit: 2018-05-06 | Discharge: 2018-05-06 | Disposition: A | Discharge status: Home / Self Care | Payer: Medicare Other | Attending: Internal Medicine | Admitting: Internal Medicine

## 2018-05-06 ENCOUNTER — Encounter: Payer: Self-pay | Admitting: Internal Medicine

## 2018-05-06 DIAGNOSIS — Z6841 Body Mass Index (BMI) 40.0 and over, adult: Secondary | ICD-10-CM | POA: Insufficient documentation

## 2018-05-06 DIAGNOSIS — I4891 Unspecified atrial fibrillation: Secondary | ICD-10-CM | POA: Diagnosis present

## 2018-05-06 DIAGNOSIS — I480 Paroxysmal atrial fibrillation: Principal | ICD-10-CM | POA: Insufficient documentation

## 2018-05-06 DIAGNOSIS — F329 Major depressive disorder, single episode, unspecified: Secondary | ICD-10-CM | POA: Insufficient documentation

## 2018-05-06 DIAGNOSIS — I10 Essential (primary) hypertension: Secondary | ICD-10-CM | POA: Insufficient documentation

## 2018-05-06 DIAGNOSIS — G4733 Obstructive sleep apnea (adult) (pediatric): Secondary | ICD-10-CM | POA: Diagnosis not present

## 2018-05-06 DIAGNOSIS — K219 Gastro-esophageal reflux disease without esophagitis: Secondary | ICD-10-CM | POA: Diagnosis not present

## 2018-05-06 DIAGNOSIS — Z79899 Other long term (current) drug therapy: Secondary | ICD-10-CM | POA: Diagnosis not present

## 2018-05-06 LAB — CBC
HCT: 45.4 % (ref 40.0–52.0)
HEMOGLOBIN: 14.8 g/dL (ref 13.0–18.0)
MCH: 25.8 pg — ABNORMAL LOW (ref 26.0–34.0)
MCHC: 32.5 g/dL (ref 32.0–36.0)
MCV: 79.2 fL — ABNORMAL LOW (ref 80.0–100.0)
PLATELETS: 318 10*3/uL (ref 150–440)
RBC: 5.73 MIL/uL (ref 4.40–5.90)
RDW: 16.4 % — ABNORMAL HIGH (ref 11.5–14.5)
WBC: 8.5 10*3/uL (ref 3.8–10.6)

## 2018-05-06 LAB — BASIC METABOLIC PANEL
ANION GAP: 7 (ref 5–15)
BUN: 15 mg/dL (ref 6–20)
CALCIUM: 8.7 mg/dL — AB (ref 8.9–10.3)
CO2: 25 mmol/L (ref 22–32)
CREATININE: 1.06 mg/dL (ref 0.61–1.24)
Chloride: 104 mmol/L (ref 101–111)
Glucose, Bld: 117 mg/dL — ABNORMAL HIGH (ref 65–99)
Potassium: 4.8 mmol/L (ref 3.5–5.1)
Sodium: 136 mmol/L (ref 135–145)

## 2018-05-06 LAB — ECHOCARDIOGRAM COMPLETE
HEIGHTINCHES: 68 in
Weight: 4272 oz

## 2018-05-06 LAB — TROPONIN I: Troponin I: <0.03 ng/mL (ref <0.03)

## 2018-05-06 MED ORDER — DILTIAZEM HCL-DEXTROSE 100-5 MG/100ML-% IV SOLN (PREMIX): 5.0000 mg/h | INTRAVENOUS | Status: DC

## 2018-05-06 MED ORDER — APIXABAN 5 MG PO TABS
5.0000 mg | ORAL_TABLET | Freq: Two times a day (BID) | ORAL | Status: DC
  Administered 2018-05-06 – 2018-05-07 (×2): 5 mg via ORAL
  Filled 2018-05-06 (×2): qty 1

## 2018-05-06 MED ORDER — SODIUM CHLORIDE 0.9% FLUSH
3.0000 mL | Freq: Two times a day (BID) | INTRAVENOUS | Status: DC
  Administered 2018-05-06: 3 mL via INTRAVENOUS

## 2018-05-06 MED ORDER — SODIUM CHLORIDE 0.9% FLUSH: 3.0000 mL | INTRAVENOUS | Status: DC | PRN

## 2018-05-06 MED ORDER — DEXTROSE 5 % IV SOLN: 5.0000 mg/h | INTRAVENOUS | Status: DC

## 2018-05-06 MED ORDER — FLUOXETINE HCL 20 MG PO CAPS
20.0000 mg | ORAL_CAPSULE | ORAL | Status: DC
  Administered 2018-05-07: 20 mg via ORAL
  Filled 2018-05-06 (×2): qty 1

## 2018-05-06 MED ORDER — DEXTROSE 5 % IV SOLN
5.0000 mg/h | INTRAVENOUS | Status: DC
  Administered 2018-05-06: 5 mg/h via INTRAVENOUS

## 2018-05-06 MED ORDER — ONDANSETRON HCL 4 MG/2ML IJ SOLN: 4.0000 mg | Freq: Four times a day (QID) | INTRAMUSCULAR | Status: DC | PRN

## 2018-05-06 MED ORDER — SODIUM CHLORIDE 0.9 % IV SOLN: 250.0000 mL | INTRAVENOUS | Status: DC | PRN

## 2018-05-06 MED ORDER — ASPIRIN EC 81 MG PO TBEC
81.0000 mg | DELAYED_RELEASE_TABLET | Freq: Every day | ORAL | Status: DC
  Administered 2018-05-07: 81 mg via ORAL
  Filled 2018-05-06 (×2): qty 1

## 2018-05-06 MED ORDER — DILTIAZEM HCL ER COATED BEADS 120 MG PO CP24
120.0000 mg | ORAL_CAPSULE | Freq: Every day | ORAL | Status: DC
  Administered 2018-05-06 – 2018-05-07 (×2): 120 mg via ORAL
  Filled 2018-05-06 (×2): qty 1

## 2018-05-06 MED ORDER — ROPINIROLE HCL 1 MG PO TABS
4.0000 mg | ORAL_TABLET | Freq: Once | ORAL | Status: AC
  Administered 2018-05-06: 4 mg via ORAL

## 2018-05-06 MED ORDER — ACETAMINOPHEN 325 MG PO TABS: 650.0000 mg | ORAL_TABLET | ORAL | Status: DC | PRN

## 2018-05-06 MED ORDER — DILTIAZEM LOAD VIA INFUSION
20.0000 mg | Freq: Once | INTRAVENOUS | Status: DC
  Filled 2018-05-06: qty 20

## 2018-05-06 MED ORDER — FLUTICASONE PROPIONATE 50 MCG/ACT NA SUSP
2.0000 | Freq: Every day | NASAL | Status: DC
  Administered 2018-05-07: 2 via NASAL
  Filled 2018-05-06: qty 16

## 2018-05-06 MED ORDER — DILTIAZEM HCL 100 MG IV SOLR
INTRAVENOUS | Status: AC
  Filled 2018-05-06: qty 100

## 2018-05-06 MED ORDER — DILTIAZEM HCL 25 MG/5ML IV SOLN
INTRAVENOUS | Status: AC
  Filled 2018-05-06: qty 5

## 2018-05-06 MED ORDER — PANTOPRAZOLE SODIUM 40 MG PO TBEC
40.0000 mg | DELAYED_RELEASE_TABLET | Freq: Every day | ORAL | Status: DC
  Administered 2018-05-07: 40 mg via ORAL
  Filled 2018-05-06 (×2): qty 1

## 2018-05-06 MED ORDER — DILTIAZEM HCL 25 MG/5ML IV SOLN
20.0000 mg | Freq: Once | INTRAVENOUS | Status: AC
  Administered 2018-05-06: 20 mg via INTRAVENOUS

## 2018-05-06 MED ORDER — TRAZODONE HCL 50 MG PO TABS
50.0000 mg | ORAL_TABLET | Freq: Every day | ORAL | Status: DC
  Administered 2018-05-06: 50 mg via ORAL
  Filled 2018-05-06: qty 1

## 2018-05-06 MED ORDER — BUPROPION HCL ER (XL) 150 MG PO TB24
300.0000 mg | ORAL_TABLET | Freq: Every day | ORAL | Status: DC
  Administered 2018-05-07: 300 mg via ORAL
  Filled 2018-05-06 (×2): qty 2

## 2018-05-06 MED ORDER — ROPINIROLE HCL 1 MG PO TABS
4.0000 mg | ORAL_TABLET | Freq: Three times a day (TID) | ORAL | Status: DC
  Administered 2018-05-06 (×2): 4 mg via ORAL
  Filled 2018-05-06 (×3): qty 4

## 2018-05-06 NOTE — H&P (Signed)
[place] [facility] [place] NAME: Benjamin [name]    MR#:  [id number]  DATE OF BIRTH:  03/13/1951  DATE OF ADMISSION:  05/06/2018  PRIMARY CARE PHYSICIAN: System, Pcp Not In   REQUESTING/REFERRING PHYSICIAN:   CHIEF COMPLAINT:   Chief Complaint  Patient presents with  . Atrial Fibrillation    HISTORY OF PRESENT ILLNESS: Benjamin [name]  is a 67 y.o. male with a known history of GERD, sleep apnea presented to the emergency room with palpitations.  Patient woke up around 3 AM this morning with a racing heart.  He had a history of proximal atrial fibrillation in the past which converted to sinus rhythm and is off medication.  No complaints of any chest pain, shortness of breath.  Not on any blood thinner medications.  Patient was started on IV Cardizem drip in the emergency room for rate control. hospitalist service was consulted for further care.  PAST MEDICAL HISTORY:   Past Medical History:  Diagnosis Date  . Depression   . GERD (gastroesophageal reflux disease)   . OSA (obstructive sleep apnea)     PAST SURGICAL HISTORY:  Past Surgical History:  Procedure Laterality Date  . none      SOCIAL HISTORY:  Social History   Tobacco Use  . Smoking status: Never Smoker  . Smokeless tobacco: Never Used  Substance Use Topics  . Alcohol use: Yes    FAMILY HISTORY:  Family History  Problem Relation Age of Onset  . CAD Neg Hx   . Diabetes Mellitus II Neg Hx     DRUG ALLERGIES: No Known Allergies  REVIEW OF SYSTEMS:   CONSTITUTIONAL: No fever, fatigue or weakness.  EYES: No blurred or double vision.  EARS, NOSE, AND THROAT: No tinnitus or ear pain.  RESPIRATORY: No cough, shortness of breath, wheezing or hemoptysis.  CARDIOVASCULAR: No chest pain, orthopnea, edema.  Has palpitations. GASTROINTESTINAL: No nausea, vomiting, diarrhea or abdominal pain.  GENITOURINARY: No dysuria, hematuria.  ENDOCRINE: No polyuria, nocturia,   HEMATOLOGY: No anemia, easy bruising or bleeding SKIN: No rash or lesion. MUSCULOSKELETAL: No joint pain or arthritis.   NEUROLOGIC: No tingling, numbness, weakness.  PSYCHIATRY: No anxiety or depression.   MEDICATIONS AT HOME:  Prior to Admission medications   Medication Sig Start Date End Date Taking? Authorizing Provider  buPROPion (WELLBUTRIN XL) 150 MG 24 hr tablet Take 300 mg by mouth daily. 01/20/16  Yes [provider]  FLUoxetine (PROZAC) 20 MG capsule Take 20 mg by mouth every morning. 03/04/18  Yes [provider]  fluticasone (FLONASE) 50 MCG/ACT nasal spray Place 2 sprays into both nostrils daily. 03/10/18  Yes [provider]  ibuprofen (ADVIL,MOTRIN) 200 MG tablet Take 400 mg by mouth every 8 (eight) hours as needed for mild pain or moderate pain.    Yes [provider]  pantoprazole (PROTONIX) 40 MG tablet Take 40 mg by mouth daily. 01/23/16  Yes [provider]  rOPINIRole (REQUIP) 4 MG tablet Take 4 mg by mouth 3 (three) times daily. 03/25/18  Yes [provider]  testosterone cypionate (DEPOTESTOSTERONE CYPIONATE) 200 MG/ML injection Inject 200 mg into the muscle every 14 (fourteen) days. 03/28/18  Yes [provider]  traZODone (DESYREL) 50 MG tablet Take 50 mg by mouth at bedtime as needed. 04/19/18  Yes [provider]  diltiazem (CARDIZEM CD) 120 MG 24 hr capsule Take 1 capsule (120 mg total) by mouth daily. Patient not taking: Reported on  05/06/2018 02/05/16   Epifanio Lesches, MD      PHYSICAL EXAMINATION:   VITAL SIGNS: Blood pressure 114/77, pulse 80, temperature 98.4 F (36.9 C), temperature source Oral, resp. rate 16, SpO2 93 %.  GENERAL:  67 y.o.-year-old patient lying in the bed with no acute distress.  EYES: Pupils equal, round, reactive to light and accommodation. No scleral icterus. Extraocular muscles intact.  HEENT: Head atraumatic, normocephalic. Oropharynx and nasopharynx clear.  NECK:   Supple, no jugular venous distention. No thyroid enlargement, no tenderness.  LUNGS: Normal breath sounds bilaterally, no wheezing, rales,rhonchi or crepitation. No use of accessory muscles of respiration.  CARDIOVASCULAR: S1, S2 irregular. No murmurs, rubs, or gallops.  ABDOMEN: Soft, nontender, nondistended. Bowel sounds present. No organomegaly or mass.  EXTREMITIES: No pedal edema, cyanosis, or clubbing.  NEUROLOGIC: Cranial nerves II through XII are intact. Muscle strength 5/5 in all extremities. Sensation intact. Gait not checked.  PSYCHIATRIC: The patient is alert and oriented x 3.  SKIN: No obvious rash, lesion, or ulcer.   LABORATORY PANEL:   CBC Recent Labs  Lab 05/06/18 0827  WBC 8.5  HGB 14.8  HCT 45.4  PLT 318  MCV 79.2*  MCH 25.8*  MCHC 32.5  RDW 16.4*   ------------------------------------------------------------------------------------------------------------------  Chemistries  Recent Labs  Lab 05/06/18 0827  NA 136  K 4.8  CL 104  CO2 25  GLUCOSE 117*  BUN 15  CREATININE 1.06  CALCIUM 8.7*   ------------------------------------------------------------------------------------------------------------------ CrCl cannot be calculated (Unknown ideal weight.). ------------------------------------------------------------------------------------------------------------------ No results for input(s): TSH, T4TOTAL, T3FREE, THYROIDAB in the last 72 hours.  Invalid input(s): FREET3   Coagulation profile No results for input(s): INR, PROTIME in the last 168 hours. ------------------------------------------------------------------------------------------------------------------- No results for input(s): DDIMER in the last 72 hours. -------------------------------------------------------------------------------------------------------------------  Cardiac Enzymes Recent Labs  Lab 05/06/18 0834  TROPONINI <0.03    ------------------------------------------------------------------------------------------------------------------ Invalid input(s): POCBNP  ---------------------------------------------------------------------------------------------------------------  Urinalysis No results found for: COLORURINE, APPEARANCEUR, LABSPEC, PHURINE, GLUCOSEU, HGBUR, BILIRUBINUR, KETONESUR, PROTEINUR, UROBILINOGEN, NITRITE, LEUKOCYTESUR   RADIOLOGY: Dg Chest Portable 1 View  Result Date: 05/06/2018 CLINICAL DATA:  Shortness of breath EXAM: PORTABLE CHEST 1 VIEW COMPARISON:  02/04/2016 FINDINGS: Low lung volumes. Mild cardiomegaly and bibasilar atelectasis. No effusions or acute bony abnormality. IMPRESSION: Low lung volumes with mild cardiomegaly and bibasilar atelectasis. Electronically Signed   By: Rolm Baptise M.D.   On: 05/06/2018 08:54    EKG: Orders placed or performed during the hospital encounter of 05/06/18  . EKG 12-Lead  . EKG 12-Lead  . ED EKG within 10 minutes  . ED EKG within 10 minutes    IMPRESSION AND PLAN:  67 year old male patient with history of GERD, sleep apnea presented to the emergency room with palpitations.  -Atrial fibrillation with rapid rate  continue IV Cardizem drip for rate control Admit patient to telemetry observation bed Switch to oral Cardizem when parameters met Cardiology consultation Cycle troponin to rule out ischemia Check echocardiogram  -GERD Resume oral proton pump inhibitor  -DVT prophylaxis On anticoagulation with subcu Lovenox  -Sleep apnea CPAP at bedtime if needed All the records are reviewed and case discussed with ED provider. Management plans discussed with the patient, family and they are in agreement.  CODE STATUS:Full code Code Status History    Date Active Date Inactive Code Status Order ID Comments User Context   02/04/2016 1627 02/05/2016 2111 Full Code [id number]  Bettey Costa, MD ED       TOTAL TIME TAKING CARE OF THIS  PATIENT: 68  minutes.    Saundra Shelling M.D on 05/06/2018 at 10:50 AM  Between 7am to 6pm - Pager - [phone]  After 6pm go to [email or web address] - password [email or web address] [facility]  Office  [phone]  CC: Primary care physician; System, Pcp Not In

## 2018-05-06 NOTE — Progress Notes (Signed)
Patient ambulated around nurses station twice, is currently in sinus rhythm, heart rate in the 80's. Tolerated well.

## 2018-05-06 NOTE — Consult Note (Signed)
[facility] Consultation Note  Patient ID: Benjamin [name], MRN: [id number], DOB/AGE: Jul 01, 1951 67 y.o. Admit date: 05/06/2018   Date of Consult: 05/06/2018 Primary Physician: Sharyne Peach, MD Primary Cardiologist: Nehemiah Massed  Chief Complaint:  Chief Complaint  Patient presents with  . Atrial Fibrillation   Reason for Consult: Atrial fibrillation with rapid ventricular rate  HPI: 67 y.o. male with known essential hypertension sleep apnea and paroxysmal nonvalvular atrial fibrillation for which the patient has been on appropriate medication management including diltiazem.  The patient has done fairly well with management of this issue until Monday when he had some palpitations and weakness fatigue and shortness of breath when doing physical activity.  He had this waxing and waning for the next 24 hours and felt weak and fatigued.  Last night in the middle the night he wake up with severe shortness of breath palpitations and rapid ventricular rate for which no evidence of chest pain or heart failure type symptoms.  He was seen in the emergency room with an EKG showing atrial fibrillation with rapid ventricular rate and was placed on diltiazem drip.  Since then he spontaneously converted to normal sinus rhythm and feels much better.  The patient has a low CHADS2 score at this time but with his recurrent atrial fibrillation with rapid ventricular rate will need likely anticoagulation for 1 month for further treatment options of risk of stroke with atrial fibrillation.  Early there is no evidence of congestive heart failure or myocardial infarction  Past Medical History:  Diagnosis Date  . Depression   . GERD (gastroesophageal reflux disease)   . OSA (obstructive sleep apnea)       Surgical History:  Past Surgical History:  Procedure Laterality Date  . none       Home Meds: Prior to Admission medications   Medication Sig Start Date End Date Taking? Authorizing Provider   buPROPion (WELLBUTRIN XL) 150 MG 24 hr tablet Take 300 mg by mouth daily. 01/20/16  Yes [provider]  FLUoxetine (PROZAC) 20 MG capsule Take 20 mg by mouth every morning. 03/04/18  Yes [provider]  fluticasone (FLONASE) 50 MCG/ACT nasal spray Place 2 sprays into both nostrils daily. 03/10/18  Yes [provider]  ibuprofen (ADVIL,MOTRIN) 200 MG tablet Take 400 mg by mouth every 8 (eight) hours as needed for mild pain or moderate pain.    Yes [provider]  pantoprazole (PROTONIX) 40 MG tablet Take 40 mg by mouth daily. 01/23/16  Yes [provider]  rOPINIRole (REQUIP) 4 MG tablet Take 4 mg by mouth 3 (three) times daily. 03/25/18  Yes [provider]  testosterone cypionate (DEPOTESTOSTERONE CYPIONATE) 200 MG/ML injection Inject 200 mg into the muscle every 14 (fourteen) days. 03/28/18  Yes [provider]  traZODone (DESYREL) 50 MG tablet Take 50 mg by mouth at bedtime as needed. 04/19/18  Yes [provider]  diltiazem (CARDIZEM CD) 120 MG 24 hr capsule Take 1 capsule (120 mg total) by mouth daily. Patient not taking: Reported on 05/06/2018 02/05/16   Epifanio Lesches, MD    Inpatient Medications:  . aspirin EC  81 mg Oral Daily  . buPROPion  300 mg Oral Daily  . diltiazem  20 mg Intravenous Once  . FLUoxetine  20 mg Oral [id number]  . fluticasone  2 spray Each Nare Daily  . pantoprazole  40 mg Oral Daily  . rOPINIRole  4 mg Oral TID  . sodium chloride flush  3  mL Intravenous Q12H  . traZODone  50 mg Oral QHS   . sodium chloride    . diltiazem (CARDIZEM) infusion 5 mg/hr (05/06/18 1209)    Allergies: No Known Allergies  Social History   Socioeconomic History  . Marital status: Married    Spouse name: Not on file  . Number of children: Not on file  . Years of education: Not on file  . Highest education level: Not on file  Occupational History  . Occupation: works part time  Scientific laboratory technician  . Financial  resource strain: Not on file  . Food insecurity:    Worry: Not on file    Inability: Not on file  . Transportation needs:    Medical: Not on file    Non-medical: Not on file  Tobacco Use  . Smoking status: Never Smoker  . Smokeless tobacco: Never Used  Substance and Sexual Activity  . Alcohol use: Yes  . Drug use: No  . Sexual activity: Not Currently  Lifestyle  . Physical activity:    Days per week: Not on file    Minutes per session: Not on file  . Stress: Not on file  Relationships  . Social connections:    Talks on phone: Not on file    Gets together: Not on file    Attends religious service: Not on file    Active member of club or organization: Not on file    Attends meetings of clubs or organizations: Not on file    Relationship status: Not on file  . Intimate partner violence:    Fear of current or ex partner: Not on file    Emotionally abused: Not on file    Physically abused: Not on file    Forced sexual activity: Not on file  Other Topics Concern  . Not on file  Social History Narrative  . Not on file     Family History  Problem Relation Age of Onset  . CAD Neg Hx   . Diabetes Mellitus II Neg Hx      Review of Systems Positive for palpitations shortness of breath Negative for: General:  chills, fever, night sweats or weight changes.  Cardiovascular: PND orthopnea syncope dizziness  Dermatological skin lesions rashes Respiratory: Cough congestion Urologic: Frequent urination urination at night and hematuria Abdominal: negative for nausea, vomiting, diarrhea, bright red blood per rectum, melena, or hematemesis Neurologic: negative for visual changes, and/or hearing changes  All other systems reviewed and are otherwise negative except as noted above.  Labs: Recent Labs    05/06/18 0834 05/06/18 1221  TROPONINI <0.03 <0.03   Lab Results  Component Value Date   WBC 8.5 05/06/2018   HGB 14.8 05/06/2018   HCT 45.4 05/06/2018   MCV 79.2 (L)  05/06/2018   PLT 318 05/06/2018    Recent Labs  Lab 05/06/18 0827  NA 136  K 4.8  CL 104  CO2 25  BUN 15  CREATININE 1.06  CALCIUM 8.7*  GLUCOSE 117*   Lab Results  Component Value Date   CHOL 133 02/04/2016   HDL 32 (L) 02/04/2016   LDLCALC 44 02/04/2016   TRIG 285 (H) 02/04/2016   No results found for: DDIMER  Radiology/Studies:  Dg Chest Portable 1 View  Result Date: 05/06/2018 CLINICAL DATA:  Shortness of breath EXAM: PORTABLE CHEST 1 VIEW COMPARISON:  02/04/2016 FINDINGS: Low lung volumes. Mild cardiomegaly and bibasilar atelectasis. No effusions or acute bony abnormality. IMPRESSION: Low lung volumes with mild cardiomegaly  and bibasilar atelectasis. Electronically Signed   By: Rolm Baptise M.D.   On: 05/06/2018 08:54    EKG: Atrial fibrillation with rapid ventricular rate  Weights: Filed Weights   05/06/18 1151  Weight: 267 lb (121.1 kg)     Physical Exam: Blood pressure 124/77, pulse 70, temperature 98.8 F (37.1 C), temperature source Oral, resp. rate 18, height 5\' 8"  (1.727 m), weight 267 lb (121.1 kg), SpO2 99 %. Body mass index is 40.6 kg/m. General: Well developed, well nourished, in no acute distress. Head eyes ears nose throat: Normocephalic, atraumatic, sclera non-icteric, no xanthomas, nares are without discharge. No apparent thyromegaly and/or mass  Lungs: Normal respiratory effort.  no wheezes, no rales, no rhonchi.  Heart: RRR with normal S1 S2. no murmur gallop, no rub, PMI is normal size and placement, carotid upstroke normal without bruit, jugular venous pressure is normal Abdomen: Soft, non-tender, non-distended with normoactive bowel sounds. No hepatomegaly. No rebound/guarding. No obvious abdominal masses. Abdominal aorta is normal size without bruit Extremities: No edema. no cyanosis, no clubbing, no ulcers  Peripheral : 2+ bilateral upper extremity pulses, 2+ bilateral femoral pulses, 2+ bilateral dorsal pedal pulse Neuro: Alert and  oriented. No facial asymmetry. No focal deficit. Moves all extremities spontaneously. Musculoskeletal: Normal muscle tone without kyphosis Psych:  Responds to questions appropriately with a normal affect.    Assessment: 67 year old male with essential hypertension sleep apnea with paroxysmal nonvalvular atrial fibrillation with rapid ventricular rate now converted to normal rhythm with appropriate medication management without evidence of microinfarction or chest discomfort or congestive heart failure  Plan: 1.  Discontinuation of diltiazem drip 2.  Reinstatement of diltiazem long-acting medication management at 120 mg each day 3.  Begin ambulation and follow for improvements of symptoms and recurrence of atrial fibrillation 4.  Anticoagulation with Eliquis 5 mg twice per day for further risk reduction stroke with atrial fibrillation 5.  If ambulating well this afternoon with no further symptoms okay for discharged home with follow-up in 1 to 2 days  Signed, Corey Skains M.D. [facility] 05/06/2018, 1:19 PM

## 2018-05-06 NOTE — ED Provider Notes (Signed)
[facility] Emergency Department Provider Note   ____________________________________________    I have reviewed the triage vital signs and the nursing notes.   HISTORY  Chief Complaint Atrial Fibrillation     HPI Benjamin [name] is a 67 y.o. male who presents with complaints of palpitations.  Patient notes that he woke up at 3 AM with a racing heart.  Patient has a history of paroxysmal atrial fibrillation but has been off meds for over a year.  He denies chest pain denies shortness of breath.  Reports 2 days ago was exerting himself in the heat and felt quite "wiped out "but does not think that he was having palpitations at that time.  He is not on blood thinners.  No nausea or vomiting.  No fevers or chills.   Past Medical History:  Diagnosis Date  . Depression   . GERD (gastroesophageal reflux disease)   . OSA (obstructive sleep apnea)     Patient Active Problem List   Diagnosis Date Noted  . Atrial fibrillation ([place]) 02/04/2016    No past surgical history on file.  Prior to Admission medications   Medication Sig Start Date End Date Taking? Authorizing Provider  buPROPion (WELLBUTRIN XL) 150 MG 24 hr tablet Take 300 mg by mouth daily. 01/20/16  Yes [provider]  FLUoxetine (PROZAC) 20 MG capsule Take 20 mg by mouth every morning. 03/04/18  Yes [provider]  fluticasone (FLONASE) 50 MCG/ACT nasal spray Place 2 sprays into both nostrils daily. 03/10/18  Yes [provider]  ibuprofen (ADVIL,MOTRIN) 200 MG tablet Take 400 mg by mouth every 8 (eight) hours as needed for mild pain or moderate pain.    Yes [provider]  pantoprazole (PROTONIX) 40 MG tablet Take 40 mg by mouth daily. 01/23/16  Yes [provider]  rOPINIRole (REQUIP) 4 MG tablet Take 4 mg by mouth 3 (three) times daily. 03/25/18  Yes [provider]  testosterone cypionate (DEPOTESTOSTERONE CYPIONATE) 200 MG/ML injection  Inject 200 mg into the muscle every 14 (fourteen) days. 03/28/18  Yes [provider]  traZODone (DESYREL) 50 MG tablet Take 50 mg by mouth at bedtime as needed. 04/19/18  Yes [provider]  diltiazem (CARDIZEM CD) 120 MG 24 hr capsule Take 1 capsule (120 mg total) by mouth daily. Patient not taking: Reported on 05/06/2018 02/05/16   Epifanio Lesches, MD     Allergies Patient has no known allergies.  No family history on file.  Social History Social History   Tobacco Use  . Smoking status: Never Smoker  Substance Use Topics  . Alcohol use: Yes  . Drug use: No    Review of Systems  Constitutional: No fever/chills Eyes: No visual changes.  ENT: No sore throat. Cardiovascular: Denies chest pain.  Palpitations as above Respiratory: Denies shortness of breath. Gastrointestinal: No abdominal pain.  No nausea, no vomiting.   Genitourinary: Negative for dysuria. Musculoskeletal: Negative for back pain. Skin: Negative for rash. Neurological: Negative for headaches    ____________________________________________   PHYSICAL EXAM:  VITAL SIGNS: ED Triage Vitals  Enc Vitals Group     BP 05/06/18 0817 134/89     Pulse Rate 05/06/18 0817 (!) 150     Resp 05/06/18 0817 18     Temp 05/06/18 0817 98.4 F (36.9 C)     Temp Source 05/06/18 0817 Oral     SpO2 05/06/18 0817 97 %     Weight --  Height --      Head Circumference --      Peak Flow --      Pain Score 05/06/18 0821 0     Pain Loc --      Pain Edu? --      Excl. in [place]? --     Constitutional: Alert and oriented. No acute distress. Pleasant and interactive Eyes: Conjunctivae are normal.   Nose: No congestion/rhinnorhea. Mouth/Throat: Mucous membranes are moist.    Cardiovascular: Tachycardia, irregularly irregular rhythm. Grossly normal heart sounds.  Good peripheral circulation. Respiratory: Normal respiratory effort.  No retractions. Lungs CTAB. Gastrointestinal: Soft and nontender. No  distention.  No CVA tenderness.  Musculoskeletal: No lower extremity tenderness nor edema.  Warm and well perfused Neurologic:  Normal speech and language. No gross focal neurologic deficits are appreciated.  Skin:  Skin is warm, dry and intact. No rash noted. Psychiatric: Mood and affect are normal. Speech and behavior are normal.  ____________________________________________   LABS (all labs ordered are listed, but only abnormal results are displayed)  Labs Reviewed  BASIC METABOLIC PANEL - Abnormal; Notable for the following components:      Result Value   Glucose, Bld 117 (*)    Calcium 8.7 (*)    All other components within normal limits  CBC - Abnormal; Notable for the following components:   MCV 79.2 (*)    MCH 25.8 (*)    RDW 16.4 (*)    All other components within normal limits  TROPONIN I   ____________________________________________  EKG  ED ECG REPORT I, Lavonia Drafts, the attending physician, personally viewed and interpreted this ECG.  Date: 05/06/2018  Rate: 157 Rhythm: Atrial fibrillation with rapid ventricular response QRS Axis: normal Intervals: normal ST/T Wave abnormalities: Nonspecific changes   ____________________________________________  RADIOLOGY  Chest x-ray ____________________________________________   PROCEDURES  Procedure(s) performed: No  Procedures   Critical Care performed: yes  CRITICAL CARE Performed by: Lavonia Drafts   Total critical care time: 30 minutes  Critical care time was exclusive of separately billable procedures and treating other patients.  Critical care was necessary to treat or prevent imminent or life-threatening deterioration.  Critical care was time spent personally by me on the following activities: development of treatment plan with patient and/or surrogate as well as nursing, discussions with consultants, evaluation of patient's response to treatment, examination of patient, obtaining history  from patient or surrogate, ordering and performing treatments and interventions, ordering and review of laboratory studies, ordering and review of radiographic studies, pulse oximetry and re-evaluation of patient's condition.  ____________________________________________   INITIAL IMPRESSION / ASSESSMENT AND PLAN / ED COURSE  Pertinent labs & imaging results that were available during my care of the patient were reviewed by me and considered in my medical decision making (see chart for details).  Patient presents with atrial fibrillation with rapid ventricular response, possibly started at 3 AM although it is not entirely clear.  Patient is not anticoagulated.  No chest pain or shortness of breath.  We will give IV Cardizem 20 mg and start the patient on a Cardizem drip as well.   Patient had good response to Cardizem, still in atrial fibrillation but heart rate now in the 90s admit to the hospitalist service     ____________________________________________   FINAL CLINICAL IMPRESSION(S) / ED DIAGNOSES  Final diagnoses:  Atrial fibrillation with rapid ventricular response ([place])        Note:  This document was prepared using Dragon voice recognition  software and may include unintentional dictation errors.     Lavonia Drafts, MD 05/06/18 1016

## 2018-05-06 NOTE — ED Notes (Signed)
First Nurse Note:  Patient states he woke up at approx. 0300 this AM with A.Fib.  Hx of same, last episode approx. 2.5 years ago.  Alert and oriented, color good.  Skin warm and dry.  Radial pulse irregular.  Placed in [place].

## 2018-05-06 NOTE — ED Triage Notes (Signed)
Pt presented today from home for A fib RVR. He states that he took a 325 mg aspirin this morning at 0500. He does not take any other anticoagulants. Pt denies CP at this time. +SOB. Denies dizziness or lightheadedness at this time. States that he has hx of A fib and converted out without intervention a few years ago. He is normally in NSR.

## 2018-05-06 NOTE — Care Management Obs Status (Signed)
[place] NOTIFICATION   Patient Details  Name: SHELLIE GOETTL MRN: [id number] Date of Birth: 01-24-1951   Medicare Observation Status Notification Given:  Yes    Derrick Orris A Takao Lizer, RN 05/06/2018, 11:04 AM

## 2018-05-06 NOTE — ED Notes (Signed)
First Nurse Note:  Patient to Room 10 with [facility].

## 2018-05-06 NOTE — Progress Notes (Signed)
Advanced care plan. Purpose of the Encounter: CODE STATUS Parties in Attendance: Patient Patient's Decision Capacity: Good Subjective/Patient's story: Presented to emergency room with palpitations Objective/Medical story Has atrial fibrillation with rapid rate Needs IV Cardizem drip for rate control Goals of care determination:  Advance care directives and goals of care discussed with the patient in detail Patient wants everything done which includes cardiac resuscitation and intubation and ventilator if the need arises CODE STATUS: Full code Time spent discussing advanced care planning: 16 minutes

## 2018-05-06 NOTE — Progress Notes (Signed)
*  PRELIMINARY RESULTS* Echocardiogram 2D Echocardiogram has been performed.  Sherrie Sport 05/06/2018, 2:52 PM

## 2018-05-06 NOTE — Care Management Note (Addendum)
Case Management Note  Patient Details  Name: Benjamin [name] MRN: [id number] Date of Birth: 12/19/1950  Subjective/Objective:       Patient admitted to [facility] under observation status for a fib. MOON letter provided and [facility] consult completed. Patient currently lives at home with his wife Olin Hauser [phone]. Patient is able to complete all of his activities of daily living independently and does not use any DME. Utilizes [facility] in [place] for medication needs and does not currently have issues obtaining medications. PCP is Dr Iona Beard with [facility].   Currently only has a po box address on file and per patient he provides this since he does not have a mailbox at home. Current physical address is [address], [place] [address].              Action/Plan:  RNCM to continue to follow for any needs.  Expected Discharge Date:                  Expected Discharge Plan:     In-House Referral:     Discharge planning Services     Post Acute Care Choice:    Choice offered to:     DME Arranged:    DME Agency:     HH Arranged:    HH Agency:     Status of Service:     If discussed at [employer], dates discussed:    Additional Comments:  Kathyrn Drown Dakai Braithwaite, RN 05/06/2018, 11:08 AM

## 2018-05-06 NOTE — ED Notes (Signed)
Pt resting. Respirations equal and unlabored. Stretcher in low and locked position. Call bell in reach. Pt denies any needs at this time. Will continue to monitor.

## 2018-05-07 DIAGNOSIS — I480 Paroxysmal atrial fibrillation: Secondary | ICD-10-CM | POA: Diagnosis not present

## 2018-05-07 LAB — HIV ANTIBODY (ROUTINE TESTING W REFLEX): HIV SCREEN 4TH GENERATION: NONREACTIVE

## 2018-05-07 LAB — TROPONIN I

## 2018-05-07 MED ORDER — DILTIAZEM HCL ER COATED BEADS 120 MG PO CP24: 120.0000 mg | ORAL_CAPSULE | Freq: Every day | ORAL | 1 refills | Status: DC

## 2018-05-07 MED ORDER — APIXABAN 5 MG PO TABS: 5.0000 mg | ORAL_TABLET | Freq: Two times a day (BID) | ORAL | 0 refills | Status: DC

## 2018-05-07 NOTE — Progress Notes (Signed)
Pt given a Eliquis coupon.

## 2018-05-07 NOTE — Discharge Summary (Signed)
[facility] [place] NAME: Benjamin [name]    MR#:  [id number]  DATE OF BIRTH:  1951/02/25  DATE OF ADMISSION:  05/06/2018   ADMITTING PHYSICIAN: Saundra Shelling, MD  DATE OF DISCHARGE: 05/07/2018 11:45 AM  PRIMARY CARE PHYSICIAN: Sharyne Peach, MD   ADMISSION DIAGNOSIS:  Atrial fibrillation with rapid ventricular response ([place]) [I48.91] DISCHARGE DIAGNOSIS:  Active Problems:   A-fib ([place])  SECONDARY DIAGNOSIS:   Past Medical History:  Diagnosis Date  . Depression   . GERD (gastroesophageal reflux disease)   . OSA (obstructive sleep apnea)    HOSPITAL COURSE:  67 year old male patient with history of GERD, sleep apnea presented to the emergency room with palpitations.  -Atrial fibrillation with rapid rate Improved with IV Cardizem drip, back to sinus rhythm. Switched to oral Cardizem. Started Eliquis twice daily per Dr. Nehemiah Massed.  Explained the benefit and side effect of Eliquis to the patient.  -GERD Resume oral proton pump inhibitor   -Sleep apnea and morbid obesity. CPAP at bedtime if needed DISCHARGE CONDITIONS:  Stable, discharged to home today. CONSULTS OBTAINED:  Treatment Team:  Corey Skains, MD DRUG ALLERGIES:  No Known Allergies DISCHARGE MEDICATIONS:   Allergies as of 05/07/2018   No Known Allergies     Medication List    STOP taking these medications   ibuprofen 200 MG tablet Commonly known as:  ADVIL,MOTRIN     TAKE these medications   apixaban 5 MG Tabs tablet Commonly known as:  ELIQUIS Take 1 tablet (5 mg total) by mouth 2 (two) times daily.   buPROPion 150 MG 24 hr tablet Commonly known as:  WELLBUTRIN XL Take 300 mg by mouth daily.   diltiazem 120 MG 24 hr capsule Commonly known as:  CARDIZEM CD Take 1 capsule (120 mg total) by mouth daily.   FLUoxetine 20 MG capsule Commonly known as:  PROZAC Take 20 mg by mouth every morning.   fluticasone 50 MCG/ACT nasal  spray Commonly known as:  FLONASE Place 2 sprays into both nostrils daily.   pantoprazole 40 MG tablet Commonly known as:  PROTONIX Take 40 mg by mouth daily.   rOPINIRole 4 MG tablet Commonly known as:  REQUIP Take 4 mg by mouth 3 (three) times daily.   testosterone cypionate 200 MG/ML injection Commonly known as:  DEPOTESTOSTERONE CYPIONATE Inject 200 mg into the muscle every 14 (fourteen) days.   traZODone 50 MG tablet Commonly known as:  DESYREL Take 50 mg by mouth at bedtime as needed.        DISCHARGE INSTRUCTIONS:  See AVS.   If you experience worsening of your admission symptoms, develop shortness of breath, life threatening emergency, suicidal or homicidal thoughts you must seek medical attention immediately by calling 911 or calling your MD immediately  if symptoms less severe.  You Must read complete instructions/literature along with all the possible adverse reactions/side effects for all the Medicines you take and that have been prescribed to you. Take any new Medicines after you have completely understood and accpet all the possible adverse reactions/side effects.   Please note  You were cared for by a hospitalist during your hospital stay. If you have any questions about your discharge medications or the care you received while you were in the hospital after you are discharged, you can call the unit and asked to speak with the hospitalist on call if the hospitalist that took care of you is not available. Once you  are discharged, your primary care physician will handle any further medical issues. Please note that NO REFILLS for any discharge medications will be authorized once you are discharged, as it is imperative that you return to your primary care physician (or establish a relationship with a primary care physician if you do not have one) for your aftercare needs so that they can reassess your need for medications and monitor your lab values.    On the day of  Discharge:  VITAL SIGNS:  Blood pressure (!) 145/69, pulse 78, temperature 98.1 F (36.7 C), temperature source Oral, resp. rate 18, height 5\' 8"  (1.727 m), weight 267 lb (121.1 kg), SpO2 98 %. PHYSICAL EXAMINATION:  GENERAL:  67 y.o.-year-old patient lying in the bed with no acute distress.  Morbid obesity. EYES: Pupils equal, round, reactive to light and accommodation. No scleral icterus. Extraocular muscles intact.  HEENT: Head atraumatic, normocephalic. Oropharynx and nasopharynx clear.  NECK:  Supple, no jugular venous distention. No thyroid enlargement, no tenderness.  LUNGS: Normal breath sounds bilaterally, no wheezing, rales,rhonchi or crepitation. No use of accessory muscles of respiration.  CARDIOVASCULAR: S1, S2 normal. No murmurs, rubs, or gallops.  ABDOMEN: Soft, non-tender, non-distended. Bowel sounds present. No organomegaly or mass.  EXTREMITIES: No pedal edema, cyanosis, or clubbing.  NEUROLOGIC: Cranial nerves II through XII are intact. Muscle strength 5/5 in all extremities. Sensation intact. Gait not checked.  PSYCHIATRIC: The patient is alert and oriented x 3.  SKIN: No obvious rash, lesion, or ulcer.  DATA REVIEW:   CBC Recent Labs  Lab 05/06/18 0827  WBC 8.5  HGB 14.8  HCT 45.4  PLT 318    Chemistries  Recent Labs  Lab 05/06/18 0827  NA 136  K 4.8  CL 104  CO2 25  GLUCOSE 117*  BUN 15  CREATININE 1.06  CALCIUM 8.7*     Microbiology Results  No results found for this or any previous visit.  RADIOLOGY:  No results found.   Management plans discussed with the patient, family and they are in agreement.  CODE STATUS: Prior   TOTAL TIME TAKING CARE OF THIS PATIENT: 26 minutes.    Demetrios Loll M.D on 05/07/2018 at 4:12 PM  Between 7am to 6pm - Pager - [phone]  After 6pm go to [email or web address] - Proofreader  [facility]  Office  [phone]  CC: Primary care physician; Sharyne Peach, MD   Note:  This dictation was prepared with Dragon dictation along with smaller phrase technology. Any transcriptional errors that result from this process are unintentional.

## 2018-05-07 NOTE — Progress Notes (Signed)
[facility] Encounter Note  Patient: Benjamin [name] / Admit Date: 05/06/2018 / Date of Encounter: 05/07/2018, 8:28 AM   Subjective: Patient feels well overnight with no evidence of significant chest pain palpitations irregular heartbeat shortness of breath.  Normal troponin and normal EKG at this time without evidence of myocardial infarction and patient tolerating medications well Echocardiogram showing normal LV systolic function ejection fraction is 60% with no evidence of significant valvular heart disease Review of Systems: Positive for: None Negative for: Vision change, hearing change, syncope, dizziness, nausea, vomiting,diarrhea, bloody stool, stomach pain, cough, congestion, diaphoresis, urinary frequency, urinary pain,skin lesions, skin rashes Others previously listed  Objective: Telemetry: Normal sinus rhythm Physical Exam: Blood pressure (!) 145/69, pulse 78, temperature 98.1 F (36.7 C), temperature source Oral, resp. rate 18, height 5\' 8"  (1.727 m), weight 267 lb (121.1 kg), SpO2 98 %. Body mass index is 40.6 kg/m. General: Well developed, well nourished, in no acute distress. Head: Normocephalic, atraumatic, sclera non-icteric, no xanthomas, nares are without discharge. Neck: No apparent masses Lungs: Normal respirations with no wheezes, no rhonchi, no rales , no crackles   Heart: Regular rate and rhythm, normal S1 S2, no murmur, no rub, no gallop, PMI is normal size and placement, carotid upstroke normal without bruit, jugular venous pressure normal Abdomen: Soft, non-tender, non-distended with normoactive bowel sounds. No hepatosplenomegaly. Abdominal aorta is normal size without bruit Extremities: No edema, no clubbing, no cyanosis, no ulcers,  Peripheral: 2+ radial, 2+ femoral, 2+ dorsal pedal pulses Neuro: Alert and oriented. Moves all extremities spontaneously. Psych:  Responds to questions appropriately with a normal  affect.   Intake/Output Summary (Last 24 hours) at 05/07/2018 0828 Last data filed at 05/07/2018 0509 Gross per 24 hour  Intake -  Output 380 ml  Net -380 ml    Inpatient Medications:  . apixaban  5 mg Oral BID  . aspirin EC  81 mg Oral Daily  . buPROPion  300 mg Oral Daily  . diltiazem  120 mg Oral Daily  . FLUoxetine  20 mg Oral [id number]  . fluticasone  2 spray Each Nare Daily  . pantoprazole  40 mg Oral Daily  . rOPINIRole  4 mg Oral TID  . sodium chloride flush  3 mL Intravenous Q12H  . traZODone  50 mg Oral QHS   Infusions:  . sodium chloride      Labs: Recent Labs    05/06/18 0827  NA 136  K 4.8  CL 104  CO2 25  GLUCOSE 117*  BUN 15  CREATININE 1.06  CALCIUM 8.7*   No results for input(s): AST, ALT, ALKPHOS, BILITOT, PROT, ALBUMIN in the last 72 hours. Recent Labs    05/06/18 0827  WBC 8.5  HGB 14.8  HCT 45.4  MCV 79.2*  PLT 318   Recent Labs    05/06/18 0834 05/06/18 1221 05/06/18 1753 05/06/18 2338  TROPONINI <0.03 <0.03 <0.03 <0.03   Invalid input(s): POCBNP No results for input(s): HGBA1C in the last 72 hours.   Weights: Filed Weights   05/06/18 1151  Weight: 267 lb (121.1 kg)     Radiology/Studies:  Dg Chest Portable 1 View  Result Date: 05/06/2018 CLINICAL DATA:  Shortness of breath EXAM: PORTABLE CHEST 1 VIEW COMPARISON:  02/04/2016 FINDINGS: Low lung volumes. Mild cardiomegaly and bibasilar atelectasis. No effusions or acute bony abnormality. IMPRESSION: Low lung volumes with mild cardiomegaly and bibasilar atelectasis. Electronically Signed   By: Rolm Baptise M.D.   On: 05/06/2018  08:6     Assessment and Recommendation  67 y.o. male with known essential hypertension paroxysmal nonvalvular atrial fibrillation having an episode of atrial fibrillation with rapid ventricular rate now spontaneously converted to normal sinus rhythm without evidence of heart failure or myocardial infarction 1.  Continue diltiazem 120 mg each day for  better heart rate control and maintenance of normal sinus rhythm 2.  No further cardiac intervention or diagnostics necessary at this time 3.  Eliquis 5 mg twice per day for further risk reduction in stroke with atrial fibrillation 4.  Begin ambulation and follow for improvements of symptoms and possible discharged home with follow-up next week  Signed, Serafina Royals M.D. FACC

## 2018-07-26 ENCOUNTER — Other Ambulatory Visit: Payer: Self-pay

## 2018-07-26 ENCOUNTER — Emergency Department
Admission: EM | Admit: 2018-07-26 | Discharge: 2018-07-26 | Disposition: A | Discharge status: Home / Self Care | Payer: Medicare Other | Attending: Emergency Medicine | Admitting: Emergency Medicine

## 2018-07-26 ENCOUNTER — Emergency Department: Payer: Medicare Other

## 2018-07-26 DIAGNOSIS — Z7901 Long term (current) use of anticoagulants: Secondary | ICD-10-CM | POA: Diagnosis not present

## 2018-07-26 DIAGNOSIS — Z79899 Other long term (current) drug therapy: Secondary | ICD-10-CM | POA: Diagnosis not present

## 2018-07-26 DIAGNOSIS — M5412 Radiculopathy, cervical region: Secondary | ICD-10-CM | POA: Diagnosis not present

## 2018-07-26 DIAGNOSIS — M542 Cervicalgia: Secondary | ICD-10-CM | POA: Diagnosis present

## 2018-07-26 MED ORDER — PREDNISONE 20 MG PO TABS: 60.0000 mg | ORAL_TABLET | Freq: Every day | ORAL | 0 refills | Status: AC

## 2018-07-26 MED ORDER — DIAZEPAM 5 MG/ML IJ SOLN
5.0000 mg | Freq: Once | INTRAMUSCULAR | Status: AC
  Administered 2018-07-26: 5 mg via INTRAVENOUS
  Filled 2018-07-26 (×2): qty 2

## 2018-07-26 MED ORDER — METHYLPREDNISOLONE SODIUM SUCC 125 MG IJ SOLR
125.0000 mg | Freq: Once | INTRAMUSCULAR | Status: AC
  Administered 2018-07-26: 125 mg via INTRAVENOUS
  Filled 2018-07-26: qty 2

## 2018-07-26 MED ORDER — KETOROLAC TROMETHAMINE 30 MG/ML IJ SOLN
30.0000 mg | Freq: Once | INTRAMUSCULAR | Status: AC
  Administered 2018-07-26: 30 mg via INTRAVENOUS
  Filled 2018-07-26: qty 1

## 2018-07-26 MED ORDER — MORPHINE SULFATE (PF) 4 MG/ML IV SOLN
4.0000 mg | Freq: Once | INTRAVENOUS | Status: AC
  Administered 2018-07-26: 4 mg via INTRAVENOUS
  Filled 2018-07-26: qty 1

## 2018-07-26 MED ORDER — OXYCODONE-ACETAMINOPHEN 10-325 MG PO TABS: 1.0000 | ORAL_TABLET | Freq: Four times a day (QID) | ORAL | 0 refills | Status: AC | PRN

## 2018-07-26 NOTE — ED Provider Notes (Signed)
[facility] Emergency Department Provider Note   First MD Initiated Contact with Patient 07/26/18 [phone]     (approximate)  I have reviewed the triage vital signs and the nursing notes.   HISTORY  Chief Complaint Torticollis    HPI Benjamin [name] is a 67 y.o. male with below list of chronic medical conditions including cervical thoracic and lumbar radiculopathy presents to the emergency department posterior neck pain x3 days with progressive worsening.  Patient states that he woke up with the pain in his neck and thought it was "a crick in my neck".  Patient states that despite taking a Percocet 10 mg tablet, Robaxin and Vistaril tonight pain has not improved which prompted his visit to the emergency department.  Patient denies any upper extremity weakness or numbness.  Patient denies any fever.  Patient states current neck pain is 10 out of 10  Past Medical History:  Diagnosis Date  . Depression   . GERD (gastroesophageal reflux disease)   . OSA (obstructive sleep apnea)     Patient Active Problem List   Diagnosis Date Noted  . A-fib ([place]) 05/06/2018  . Atrial fibrillation ([place]) 02/04/2016    Past Surgical History:  Procedure Laterality Date  . none      Prior to Admission medications   Medication Sig Start Date End Date Taking? Authorizing Provider  apixaban (ELIQUIS) 5 MG TABS tablet Take 1 tablet (5 mg total) by mouth 2 (two) times daily. 05/07/18   Demetrios Loll, MD  buPROPion (WELLBUTRIN XL) 150 MG 24 hr tablet Take 300 mg by mouth daily. 01/20/16   [provider]  diltiazem (CARDIZEM CD) 120 MG 24 hr capsule Take 1 capsule (120 mg total) by mouth daily. 05/07/18   Demetrios Loll, MD  FLUoxetine (PROZAC) 20 MG capsule Take 20 mg by mouth every morning. 03/04/18   [provider]  fluticasone (FLONASE) 50 MCG/ACT nasal spray Place 2 sprays into both nostrils daily. 03/10/18   [provider]  pantoprazole (PROTONIX) 40 MG  tablet Take 40 mg by mouth daily. 01/23/16   [provider]  rOPINIRole (REQUIP) 4 MG tablet Take 4 mg by mouth 3 (three) times daily. 03/25/18   [provider]  testosterone cypionate (DEPOTESTOSTERONE CYPIONATE) 200 MG/ML injection Inject 200 mg into the muscle every 14 (fourteen) days. 03/28/18   [provider]  traZODone (DESYREL) 50 MG tablet Take 50 mg by mouth at bedtime as needed. 04/19/18   [provider]    Allergies No known drug allergies  Family History  Problem Relation Age of Onset  . CAD Neg Hx   . Diabetes Mellitus II Neg Hx     Social History Social History   Tobacco Use  . Smoking status: Never Smoker  . Smokeless tobacco: Never Used  Substance Use Topics  . Alcohol use: Yes  . Drug use: No    Review of Systems Constitutional: No fever/chills Eyes: No visual changes. ENT: No sore throat. Cardiovascular: Denies chest pain. Respiratory: Denies shortness of breath. Gastrointestinal: No abdominal pain.  No nausea, no vomiting.  No diarrhea.  No constipation. Genitourinary: Negative for dysuria. Musculoskeletal: Positive for neck pain.  Negative for back pain. Integumentary: Negative for rash. Neurological: Negative for headaches, focal weakness or numbness.  ____________________________________________   PHYSICAL EXAM:  VITAL SIGNS: ED Triage Vitals  Enc Vitals Group     BP 07/26/18 0033 (!) 152/88     Pulse Rate 07/26/18 0033 88  Resp 07/26/18 0033 18     Temp 07/26/18 0033 98.6 F (37 C)     Temp Source 07/26/18 0033 Oral     SpO2 07/26/18 0033 98 %     Weight 07/26/18 0031 122.5 kg (270 lb)     Height 07/26/18 0031 1.727 m (5\' 8" )     Head Circumference --      Peak Flow --      Pain Score 07/26/18 0035 10     Pain Loc --      Pain Edu? --      Excl. in [place]? --     Constitutional: Alert and oriented.  Apparent discomfort Eyes: Conjunctivae are normal. PERRL. EOMI. Head: Atraumatic. Mouth/Throat:  Mucous membranes are moist. Oropharynx non-erythematous. Neck: No stridor.  Diffuse cervical spine tenderness with palpation pain with palpation of the right trapezius/sternocleidomastoid muscles Cardiovascular: Normal rate, regular rhythm. Good peripheral circulation. Grossly normal heart sounds. Respiratory: Normal respiratory effort.  No retractions. Lungs CTAB. Gastrointestinal: Soft and nontender. No distention.  Musculoskeletal: Diffuse cervical spine tenderness with palpation pain with palpation of the right trapezius/sternocleidomastoid muscles. Neurologic:  Normal speech and language. No gross focal neurologic deficits are appreciated.  Skin:  Skin is warm, dry and intact. No rash noted. Psychiatric: Mood and affect are normal. Speech and behavior are normal.  ___________________________________  RADIOLOGY I, [place] N Nasreen Goedecke, personally viewed and evaluated these images (plain radiographs) as part of my medical decision making, as well as reviewing the written report by the radiologist.  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  Left greater than right uncovertebral hypertrophy causes moderate left foraminal stenosis at this level as well.  C5-C6 and C6-C7 severe left neural foramen stenosis.  No spinal cord compression or signal change per radiologist.  Official radiology report(s): Mr Cervical Spine Wo Contrast  Result Date: 07/26/2018 CLINICAL DATA:  Worsening neck pain with radiculopathy. EXAM: MRI CERVICAL SPINE WITHOUT CONTRAST TECHNIQUE: Multiplanar, multisequence MR imaging of the cervical spine was performed. No intravenous contrast was administered. COMPARISON:  None. FINDINGS: Alignment: Mild reversal of normal cervical lordosis. Vertebrae: No compression fracture. No discitis-osteomyelitis. Mild right C2-3 facet edema. Cord: Normal Posterior Fossa, vertebral arteries, paraspinal tissues: Visualized posterior fossa is  normal. Vertebral artery flow voids are preserved. No prevertebral soft tissue swelling. Disc levels: The C1-C2 articulation is normal. C2-C3: No spinal canal or neural foraminal stenosis. No disc herniation. C3-C4: Small central disc extrusion with minimal inferior migration. This effaces the anterior thecal sac and indents the ventral spinal cord. There is moderate spinal canal stenosis. There is left-greater-than-right uncovertebral hypertrophy causing moderate left foraminal stenosis. C4-C5: Left-greater-than-right facet hypertrophy with small disc bulge and mild left uncovertebral spurring. Mild left foraminal stenosis. No spinal canal stenosis. C5-C6: Left foraminal disc osteophyte complex with mild left facet hypertrophy causes severe left neural foraminal stenosis. No central spinal canal or right neural foraminal stenosis. C6-C7: Small left subarticular disc extrusion with minimal superior migration narrows the ventral thecal sac without causing central spinal canal stenosis. Left-greater-than-right uncovertebral hypertrophy causes severe left foraminal stenosis. C7-T1: Normal. T1-T3: These levels are imaged only in the sagittal plane. No disc herniation or stenosis. IMPRESSION: 1. 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 Left-greater-than-right uncovertebral hypertrophy causes moderate left foraminal stenosis at this level. 2. C5-6 and C6-7 severe left neural foraminal stenosis. 3. No spinal cord compression or signal change. Electronically Signed   By: Cletus Gash.D.  On: 07/26/2018 03:33    ____________________________________________    Procedures   ____________________________________________   INITIAL IMPRESSION / ASSESSMENT AND PLAN / ED COURSE  As part of my medical decision making, I reviewed the following data within the electronic MEDICAL RECORD NUMBER   67 year old male presenting with above-stated history and physical exam concerning for cervical  radiculopathy and as such MRI of the cervical spine was performed which revealed the above findings.  Patient was given Valium 5 mg IV with minimal improvement in his discomfort following MRI patient states pain score is 8 out of 10 and as such patient was given 30 mill grams IV Toradol and 125 mg of IV Solu-Medrol.  Patient subsequently required 4 mg of morphine with now pain improvement.  Spoke with patient at length regarding MRI findings and need to follow-up with neurosurgery.  Patient will be referred to Dr. Cari Caraway.  Patient given Percocet 10 mg tablets for home as well as prednisone 5-day course.  ___________________________________  FINAL CLINICAL IMPRESSION(S) / ED DIAGNOSES  Final diagnoses:  Cervical radiculopathy     MEDICATIONS GIVEN DURING THIS VISIT:  Medications  diazepam (VALIUM) injection 5 mg (5 mg Intravenous Given 07/26/18 0325)  ketorolac (TORADOL) 30 MG/ML injection 30 mg (30 mg Intravenous Given 07/26/18 0426)  methylPREDNISolone sodium succinate (SOLU-MEDROL) 125 mg/2 mL injection 125 mg (125 mg Intravenous Given 07/26/18 0426)  morphine 4 MG/ML injection 4 mg (4 mg Intravenous Given 07/26/18 0510)     ED Discharge Orders    None       Note:  This document was prepared using Dragon voice recognition software and may include unintentional dictation errors.    Gregor Hams, MD 07/26/18 2227

## 2018-07-26 NOTE — ED Triage Notes (Signed)
Patient reports neck pain since Thursday and has pain has gotten worse.

## 2019-01-04 DIAGNOSIS — Z85828 Personal history of other malignant neoplasm of skin: Secondary | ICD-10-CM | POA: Insufficient documentation

## 2019-02-02 DIAGNOSIS — M5412 Radiculopathy, cervical region: Secondary | ICD-10-CM | POA: Insufficient documentation

## 2019-04-08 ENCOUNTER — Encounter: Payer: Self-pay | Admitting: Emergency Medicine

## 2019-04-08 ENCOUNTER — Other Ambulatory Visit: Payer: Self-pay

## 2019-04-08 ENCOUNTER — Inpatient Hospital Stay
Admission: EM | Admit: 2019-04-08 | Discharge: 2019-04-15 | DRG: 872 | Disposition: A | Discharge status: Home / Self Care | Payer: Medicare Other | Attending: Family Medicine | Admitting: Family Medicine

## 2019-04-08 ENCOUNTER — Emergency Department: Payer: Medicare Other

## 2019-04-08 DIAGNOSIS — L02416 Cutaneous abscess of left lower limb: Secondary | ICD-10-CM

## 2019-04-08 DIAGNOSIS — G4733 Obstructive sleep apnea (adult) (pediatric): Secondary | ICD-10-CM | POA: Diagnosis present

## 2019-04-08 DIAGNOSIS — Z1159 Encounter for screening for other viral diseases: Secondary | ICD-10-CM

## 2019-04-08 DIAGNOSIS — Z79899 Other long term (current) drug therapy: Secondary | ICD-10-CM

## 2019-04-08 DIAGNOSIS — L03116 Cellulitis of left lower limb: Secondary | ICD-10-CM | POA: Diagnosis present

## 2019-04-08 DIAGNOSIS — I480 Paroxysmal atrial fibrillation: Secondary | ICD-10-CM | POA: Diagnosis present

## 2019-04-08 DIAGNOSIS — F329 Major depressive disorder, single episode, unspecified: Secondary | ICD-10-CM | POA: Diagnosis present

## 2019-04-08 DIAGNOSIS — A419 Sepsis, unspecified organism: Secondary | ICD-10-CM | POA: Diagnosis not present

## 2019-04-08 DIAGNOSIS — K219 Gastro-esophageal reflux disease without esophagitis: Secondary | ICD-10-CM | POA: Diagnosis present

## 2019-04-08 DIAGNOSIS — Z7951 Long term (current) use of inhaled steroids: Secondary | ICD-10-CM

## 2019-04-08 DIAGNOSIS — J449 Chronic obstructive pulmonary disease, unspecified: Secondary | ICD-10-CM | POA: Diagnosis present

## 2019-04-08 DIAGNOSIS — Z7901 Long term (current) use of anticoagulants: Secondary | ICD-10-CM

## 2019-04-08 DIAGNOSIS — G2581 Restless legs syndrome: Secondary | ICD-10-CM | POA: Diagnosis present

## 2019-04-08 DIAGNOSIS — Z6841 Body Mass Index (BMI) 40.0 and over, adult: Secondary | ICD-10-CM

## 2019-04-08 DIAGNOSIS — E669 Obesity, unspecified: Secondary | ICD-10-CM | POA: Diagnosis present

## 2019-04-08 DIAGNOSIS — L03119 Cellulitis of unspecified part of limb: Secondary | ICD-10-CM | POA: Diagnosis present

## 2019-04-08 LAB — CBC WITH DIFFERENTIAL/PLATELET
Abs Immature Granulocytes: 0.09 10*3/uL — ABNORMAL HIGH (ref 0.00–0.07)
Basophils Absolute: 0.1 10*3/uL (ref 0.0–0.1)
Basophils Relative: 0 %
Eosinophils Absolute: 0.1 10*3/uL (ref 0.0–0.5)
Eosinophils Relative: 0 %
HCT: 40.4 % (ref 39.0–52.0)
Hemoglobin: 12.7 g/dL — ABNORMAL LOW (ref 13.0–17.0)
Immature Granulocytes: 1 %
Lymphocytes Relative: 6 %
Lymphs Abs: 1.2 10*3/uL (ref 0.7–4.0)
MCH: 24.9 pg — ABNORMAL LOW (ref 26.0–34.0)
MCHC: 31.4 g/dL (ref 30.0–36.0)
MCV: 79.1 fL — ABNORMAL LOW (ref 80.0–100.0)
Monocytes Absolute: 1.4 10*3/uL — ABNORMAL HIGH (ref 0.1–1.0)
Monocytes Relative: 8 %
Neutro Abs: 15.4 10*3/uL — ABNORMAL HIGH (ref 1.7–7.7)
Neutrophils Relative %: 85 %
Platelets: 337 10*3/uL (ref 150–400)
RBC: 5.11 MIL/uL (ref 4.22–5.81)
RDW: 16.9 % — ABNORMAL HIGH (ref 11.5–15.5)
WBC: 18.2 10*3/uL — ABNORMAL HIGH (ref 4.0–10.5)
nRBC: 0 % (ref 0.0–0.2)

## 2019-04-08 LAB — URINALYSIS, COMPLETE (UACMP) WITH MICROSCOPIC
Bacteria, UA: NONE SEEN
Bilirubin Urine: NEGATIVE
Glucose, UA: NEGATIVE mg/dL
Hgb urine dipstick: NEGATIVE
Ketones, ur: NEGATIVE mg/dL
Nitrite: NEGATIVE
Protein, ur: NEGATIVE mg/dL
Specific Gravity, Urine: 1.016 (ref 1.005–1.030)
Squamous Epithelial / LPF: NONE SEEN (ref 0–5)
pH: 7 (ref 5.0–8.0)

## 2019-04-08 LAB — COMPREHENSIVE METABOLIC PANEL
ALT: 21 U/L (ref 0–44)
AST: 21 U/L (ref 15–41)
Albumin: 3.9 g/dL (ref 3.5–5.0)
Alkaline Phosphatase: 68 U/L (ref 38–126)
Anion gap: 9 (ref 5–15)
BUN: 18 mg/dL (ref 8–23)
CO2: 23 mmol/L (ref 22–32)
Calcium: 8.8 mg/dL — ABNORMAL LOW (ref 8.9–10.3)
Chloride: 104 mmol/L (ref 98–111)
Creatinine, Ser: 1.21 mg/dL (ref 0.61–1.24)
GFR calc Af Amer: >60 mL/min (ref >60)
GFR calc non Af Amer: >60 mL/min (ref >60)
Glucose, Bld: 124 mg/dL — ABNORMAL HIGH (ref 70–99)
Potassium: 4.1 mmol/L (ref 3.5–5.1)
Sodium: 136 mmol/L (ref 135–145)
Total Bilirubin: 0.7 mg/dL (ref 0.3–1.2)
Total Protein: 6.9 g/dL (ref 6.5–8.1)

## 2019-04-08 LAB — LACTIC ACID, PLASMA: Lactic Acid, Venous: 1.9 mmol/L (ref 0.5–1.9)

## 2019-04-08 MED ORDER — SODIUM CHLORIDE 0.9% FLUSH: 3.0000 mL | Freq: Once | INTRAVENOUS | Status: DC

## 2019-04-08 NOTE — ED Triage Notes (Signed)
Patient states he hurt his left leg 3-4 weeks ago. Denies seeing any open wound at the time. States he kept area clean with soap and water. Patient states yesterday, he noticed a small wound in middle of left shin with serosanguinous fluid. Patient states he also had fever of 102 at home. Patient denies cough or congestion. Patient states he also has urinary urgency that has worsened today. Reports dizziness that started today.

## 2019-04-08 NOTE — ED Notes (Addendum)
Pt with swelling/redness to left lower leg; reports injuring leg several wks ago after trying to push over a stump with his mower; stump hit his leg and left "a hole"; has had pain/swelling/redness, chills and urinary frequency that have increased since injury; pt with dressing D&I to left lower calf; pt voices good understanding of plan of care

## 2019-04-09 ENCOUNTER — Encounter: Payer: Self-pay | Admitting: Radiology

## 2019-04-09 ENCOUNTER — Inpatient Hospital Stay: Payer: Medicare Other

## 2019-04-09 DIAGNOSIS — Z7901 Long term (current) use of anticoagulants: Secondary | ICD-10-CM | POA: Diagnosis not present

## 2019-04-09 DIAGNOSIS — R3915 Urgency of urination: Secondary | ICD-10-CM

## 2019-04-09 DIAGNOSIS — A419 Sepsis, unspecified organism: Secondary | ICD-10-CM | POA: Diagnosis present

## 2019-04-09 DIAGNOSIS — L089 Local infection of the skin and subcutaneous tissue, unspecified: Secondary | ICD-10-CM | POA: Diagnosis not present

## 2019-04-09 DIAGNOSIS — Z1159 Encounter for screening for other viral diseases: Secondary | ICD-10-CM | POA: Diagnosis not present

## 2019-04-09 DIAGNOSIS — R509 Fever, unspecified: Secondary | ICD-10-CM

## 2019-04-09 DIAGNOSIS — K219 Gastro-esophageal reflux disease without esophagitis: Secondary | ICD-10-CM | POA: Diagnosis present

## 2019-04-09 DIAGNOSIS — L03116 Cellulitis of left lower limb: Secondary | ICD-10-CM

## 2019-04-09 DIAGNOSIS — L02416 Cutaneous abscess of left lower limb: Secondary | ICD-10-CM | POA: Diagnosis not present

## 2019-04-09 DIAGNOSIS — J449 Chronic obstructive pulmonary disease, unspecified: Secondary | ICD-10-CM | POA: Diagnosis present

## 2019-04-09 DIAGNOSIS — B9561 Methicillin susceptible Staphylococcus aureus infection as the cause of diseases classified elsewhere: Secondary | ICD-10-CM | POA: Diagnosis not present

## 2019-04-09 DIAGNOSIS — S81802A Unspecified open wound, left lower leg, initial encounter: Secondary | ICD-10-CM

## 2019-04-09 DIAGNOSIS — E669 Obesity, unspecified: Secondary | ICD-10-CM | POA: Diagnosis present

## 2019-04-09 DIAGNOSIS — Z981 Arthrodesis status: Secondary | ICD-10-CM

## 2019-04-09 DIAGNOSIS — Z7951 Long term (current) use of inhaled steroids: Secondary | ICD-10-CM | POA: Diagnosis not present

## 2019-04-09 DIAGNOSIS — W208XXA Other cause of strike by thrown, projected or falling object, initial encounter: Secondary | ICD-10-CM

## 2019-04-09 DIAGNOSIS — G2581 Restless legs syndrome: Secondary | ICD-10-CM | POA: Diagnosis present

## 2019-04-09 DIAGNOSIS — F329 Major depressive disorder, single episode, unspecified: Secondary | ICD-10-CM | POA: Diagnosis present

## 2019-04-09 DIAGNOSIS — I480 Paroxysmal atrial fibrillation: Secondary | ICD-10-CM | POA: Diagnosis present

## 2019-04-09 DIAGNOSIS — Z888 Allergy status to other drugs, medicaments and biological substances status: Secondary | ICD-10-CM

## 2019-04-09 DIAGNOSIS — R350 Frequency of micturition: Secondary | ICD-10-CM

## 2019-04-09 DIAGNOSIS — G4733 Obstructive sleep apnea (adult) (pediatric): Secondary | ICD-10-CM | POA: Diagnosis present

## 2019-04-09 DIAGNOSIS — Z6841 Body Mass Index (BMI) 40.0 and over, adult: Secondary | ICD-10-CM | POA: Diagnosis not present

## 2019-04-09 DIAGNOSIS — Z79899 Other long term (current) drug therapy: Secondary | ICD-10-CM | POA: Diagnosis not present

## 2019-04-09 DIAGNOSIS — L03119 Cellulitis of unspecified part of limb: Secondary | ICD-10-CM | POA: Diagnosis present

## 2019-04-09 LAB — BASIC METABOLIC PANEL
Anion gap: 9 (ref 5–15)
BUN: 15 mg/dL (ref 8–23)
CO2: 17 mmol/L — ABNORMAL LOW (ref 22–32)
Calcium: 7.5 mg/dL — ABNORMAL LOW (ref 8.9–10.3)
Chloride: 109 mmol/L (ref 98–111)
Creatinine, Ser: 1.07 mg/dL (ref 0.61–1.24)
GFR calc Af Amer: >60 mL/min (ref >60)
GFR calc non Af Amer: >60 mL/min (ref >60)
Glucose, Bld: 174 mg/dL — ABNORMAL HIGH (ref 70–99)
Potassium: 3.9 mmol/L (ref 3.5–5.1)
Sodium: 135 mmol/L (ref 135–145)

## 2019-04-09 LAB — CBC
HCT: 40.2 % (ref 39.0–52.0)
Hemoglobin: 12.1 g/dL — ABNORMAL LOW (ref 13.0–17.0)
MCH: 24.2 pg — ABNORMAL LOW (ref 26.0–34.0)
MCHC: 30.1 g/dL (ref 30.0–36.0)
MCV: 80.4 fL (ref 80.0–100.0)
Platelets: 336 10*3/uL (ref 150–400)
RBC: 5 MIL/uL (ref 4.22–5.81)
RDW: 17 % — ABNORMAL HIGH (ref 11.5–15.5)
WBC: 20.5 10*3/uL — ABNORMAL HIGH (ref 4.0–10.5)
nRBC: 0 % (ref 0.0–0.2)

## 2019-04-09 LAB — LACTIC ACID, PLASMA
Lactic Acid, Venous: 2.2 mmol/L (ref 0.5–1.9)
Lactic Acid, Venous: 1 mmol/L (ref 0.5–1.9)

## 2019-04-09 LAB — SARS CORONAVIRUS 2 BY RT PCR (HOSPITAL ORDER, PERFORMED IN ~~LOC~~ HOSPITAL LAB): SARS Coronavirus 2: NEGATIVE

## 2019-04-09 MED ORDER — OXYCODONE-ACETAMINOPHEN 10-325 MG PO TABS: 1.0000 | ORAL_TABLET | Freq: Four times a day (QID) | ORAL | Status: DC | PRN

## 2019-04-09 MED ORDER — MAGNESIUM HYDROXIDE 400 MG/5ML PO SUSP
30.0000 mL | Freq: Every day | ORAL | Status: DC | PRN
  Filled 2019-04-09: qty 30

## 2019-04-09 MED ORDER — ONDANSETRON HCL 4 MG PO TABS: 4.0000 mg | ORAL_TABLET | Freq: Four times a day (QID) | ORAL | Status: DC | PRN

## 2019-04-09 MED ORDER — ENOXAPARIN SODIUM 40 MG/0.4ML ~~LOC~~ SOLN
40.0000 mg | Freq: Two times a day (BID) | SUBCUTANEOUS | Status: DC
  Administered 2019-04-09 – 2019-04-15 (×13): 40 mg via SUBCUTANEOUS
  Filled 2019-04-09 (×13): qty 0.4

## 2019-04-09 MED ORDER — VANCOMYCIN HCL 10 G IV SOLR
2000.0000 mg | Freq: Once | INTRAVENOUS | Status: AC
  Administered 2019-04-09: 2000 mg via INTRAVENOUS
  Filled 2019-04-09: qty 2000

## 2019-04-09 MED ORDER — PIPERACILLIN-TAZOBACTAM 3.375 G IVPB 30 MIN
3.3750 g | Freq: Three times a day (TID) | INTRAVENOUS | Status: DC
  Administered 2019-04-09 – 2019-04-11 (×8): 3.375 g via INTRAVENOUS
  Filled 2019-04-09 (×15): qty 50

## 2019-04-09 MED ORDER — TRAZODONE HCL 50 MG PO TABS
50.0000 mg | ORAL_TABLET | Freq: Every evening | ORAL | Status: DC | PRN
  Administered 2019-04-09 – 2019-04-13 (×3): 50 mg via ORAL
  Filled 2019-04-09 (×5): qty 1

## 2019-04-09 MED ORDER — VANCOMYCIN HCL 10 G IV SOLR
1750.0000 mg | INTRAVENOUS | Status: DC
  Administered 2019-04-10 – 2019-04-11 (×2): 1750 mg via INTRAVENOUS
  Filled 2019-04-09 (×3): qty 1750

## 2019-04-09 MED ORDER — PANTOPRAZOLE SODIUM 40 MG PO TBEC
40.0000 mg | DELAYED_RELEASE_TABLET | Freq: Every day | ORAL | Status: DC
  Administered 2019-04-09 – 2019-04-15 (×7): 40 mg via ORAL
  Filled 2019-04-09 (×7): qty 1

## 2019-04-09 MED ORDER — ACETAMINOPHEN 325 MG PO TABS: 650.0000 mg | ORAL_TABLET | Freq: Four times a day (QID) | ORAL | Status: DC | PRN

## 2019-04-09 MED ORDER — FLUOXETINE HCL 20 MG PO CAPS
20.0000 mg | ORAL_CAPSULE | ORAL | Status: DC
  Administered 2019-04-09 – 2019-04-15 (×7): 20 mg via ORAL
  Filled 2019-04-09 (×8): qty 1

## 2019-04-09 MED ORDER — FLUTICASONE PROPIONATE 50 MCG/ACT NA SUSP
2.0000 | Freq: Every day | NASAL | Status: DC | PRN
  Filled 2019-04-09: qty 16

## 2019-04-09 MED ORDER — APIXABAN 5 MG PO TABS
5.0000 mg | ORAL_TABLET | Freq: Two times a day (BID) | ORAL | Status: DC
  Filled 2019-04-09 (×2): qty 1

## 2019-04-09 MED ORDER — TESTOSTERONE CYPIONATE 200 MG/ML IM SOLN: 200.0000 mg | INTRAMUSCULAR | Status: DC

## 2019-04-09 MED ORDER — ROPINIROLE HCL 1 MG PO TABS
4.0000 mg | ORAL_TABLET | Freq: Three times a day (TID) | ORAL | Status: DC
  Administered 2019-04-09 – 2019-04-15 (×19): 4 mg via ORAL
  Filled 2019-04-09 (×19): qty 4

## 2019-04-09 MED ORDER — MORPHINE SULFATE (PF) 2 MG/ML IV SOLN
2.0000 mg | INTRAVENOUS | Status: DC | PRN
  Administered 2019-04-14: 21:00:00 2 mg via INTRAVENOUS
  Filled 2019-04-09: qty 1

## 2019-04-09 MED ORDER — BUPROPION HCL ER (XL) 150 MG PO TB24
300.0000 mg | ORAL_TABLET | Freq: Every day | ORAL | Status: DC
  Administered 2019-04-09 – 2019-04-15 (×7): 300 mg via ORAL
  Filled 2019-04-09 (×7): qty 2

## 2019-04-09 MED ORDER — ONDANSETRON HCL 4 MG/2ML IJ SOLN: 4.0000 mg | Freq: Four times a day (QID) | INTRAMUSCULAR | Status: DC | PRN

## 2019-04-09 MED ORDER — ACETAMINOPHEN 650 MG RE SUPP: 650.0000 mg | Freq: Four times a day (QID) | RECTAL | Status: DC | PRN

## 2019-04-09 MED ORDER — IOHEXOL 300 MG/ML  SOLN
100.0000 mL | Freq: Once | INTRAMUSCULAR | Status: AC | PRN
  Administered 2019-04-09: 100 mL via INTRAVENOUS

## 2019-04-09 MED ORDER — DILTIAZEM HCL ER COATED BEADS 120 MG PO CP24
120.0000 mg | ORAL_CAPSULE | Freq: Every day | ORAL | Status: DC
  Filled 2019-04-09: qty 1

## 2019-04-09 MED ORDER — OXYCODONE HCL 5 MG PO TABS
5.0000 mg | ORAL_TABLET | Freq: Four times a day (QID) | ORAL | Status: DC | PRN
  Administered 2019-04-11 – 2019-04-15 (×12): 5 mg via ORAL
  Filled 2019-04-09 (×12): qty 1

## 2019-04-09 MED ORDER — SODIUM CHLORIDE 0.9 % IV SOLN
INTRAVENOUS | Status: DC
  Administered 2019-04-09 – 2019-04-12 (×5): via INTRAVENOUS

## 2019-04-09 MED ORDER — VANCOMYCIN HCL IN DEXTROSE 1-5 GM/200ML-% IV SOLN
1000.0000 mg | Freq: Once | INTRAVENOUS | Status: DC
  Administered 2019-04-09: 1000 mg via INTRAVENOUS
  Filled 2019-04-09: qty 200

## 2019-04-09 MED ORDER — OXYCODONE-ACETAMINOPHEN 5-325 MG PO TABS
1.0000 | ORAL_TABLET | Freq: Four times a day (QID) | ORAL | Status: DC | PRN
  Administered 2019-04-09 – 2019-04-15 (×6): 1 via ORAL
  Filled 2019-04-09 (×6): qty 1

## 2019-04-09 MED ORDER — SODIUM CHLORIDE 0.9 % IV SOLN
2.0000 g | Freq: Once | INTRAVENOUS | Status: AC
  Administered 2019-04-09: 2 g via INTRAVENOUS
  Filled 2019-04-09: qty 20

## 2019-04-09 NOTE — Progress Notes (Addendum)
[facility] [place] NAME: Benjamin [name]    MR#:  [id number]  DATE OF BIRTH:  September 07, 1951  SUBJECTIVE:  CHIEF COMPLAINT:   Chief Complaint  Patient presents with  . Leg Injury   -Patient had a trauma to his left shin a month ago, has been having worsening redness and nonhealing wound and so presents to the emergency room.  REVIEW OF SYSTEMS:  Review of Systems  Constitutional: Negative for chills, fever and malaise/fatigue.  HENT: Negative for congestion, ear discharge, hearing loss and nosebleeds.   Eyes: Negative for blurred vision and double vision.  Respiratory: Negative for cough, shortness of breath and wheezing.   Cardiovascular: Positive for leg swelling. Negative for chest pain and palpitations.  Gastrointestinal: Negative for abdominal pain, constipation, diarrhea, nausea and vomiting.  Genitourinary: Negative for dysuria.  Neurological: Negative for dizziness, seizures and headaches.  Psychiatric/Behavioral: Negative for depression.    DRUG ALLERGIES:   Allergies  Allergen Reactions  . Pramipexole Itching    VITALS:  Blood pressure 136/74, pulse 84, temperature 98.8 F (37.1 C), resp. rate 20, height 5\' 8"  (1.727 m), weight 127 kg, SpO2 98 %.  PHYSICAL EXAMINATION:  Physical Exam   GENERAL:  68 y.o.-year-old obese patient lying in the bed with no acute distress.  EYES: Pupils equal, round, reactive to light and accommodation. No scleral icterus. Extraocular muscles intact.  HEENT: Head atraumatic, normocephalic. Oropharynx and nasopharynx clear.  NECK:  Supple, no jugular venous distention. No thyroid enlargement, no tenderness.  LUNGS: Normal breath sounds bilaterally, no wheezing, rales,rhonchi or crepitation. No use of accessory muscles of respiration.  Decreased bibasilar breath sounds CARDIOVASCULAR: S1, S2 normal. No murmurs, rubs, or gallops.  ABDOMEN: Soft, nontender, nondistended. Bowel sounds present.  No organomegaly or mass.  EXTREMITIES: Left foot 2+ edema, swollen left leg with erythema and open wounds with purulent discharge.  No  cyanosis, or clubbing.  NEUROLOGIC: Cranial nerves II through XII are intact. Muscle strength 5/5 in all extremities. Sensation intact. Gait not checked.  PSYCHIATRIC: The patient is alert and oriented x 3.  SKIN: No obvious rash, lesion, or ulcer.          LABORATORY PANEL:   CBC Recent Labs  Lab 04/09/19 0500  WBC 20.5*  HGB 12.1*  HCT 40.2  PLT 336   ------------------------------------------------------------------------------------------------------------------  Chemistries  Recent Labs  Lab 04/08/19 2102 04/09/19 0500  NA 136 135  K 4.1 3.9  CL 104 109  CO2 23 17*  GLUCOSE 124* 174*  BUN 18 15  CREATININE 1.21 1.07  CALCIUM 8.8* 7.5*  AST 21  --   ALT 21  --   ALKPHOS 68  --   BILITOT 0.7  --    ------------------------------------------------------------------------------------------------------------------  Cardiac Enzymes No results for input(s): TROPONINI in the last 168 hours. ------------------------------------------------------------------------------------------------------------------  RADIOLOGY:  Dg Tibia/fibula Left  Result Date: 04/08/2019 CLINICAL DATA:  Swelling EXAM: LEFT TIBIA AND FIBULA - 2 VIEW COMPARISON:  None. FINDINGS: Diffuse soft tissue swelling. No fracture or malalignment. No periostitis. No soft tissue emphysema. 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 Ossicle adjacent to the fibular malleolus IMPRESSION: Diffuse soft tissue swelling without acute osseous abnormality Electronically Signed   By: Donavan Foil M.D.   On: 04/08/2019 23:10   US Venous Img Lower Unilateral Left  Result Date: 04/09/2019 CLINICAL DATA:  68 year old male with left leg pain redness and swelling after an injury that occurred several weeks ago. EXAM: LEFT LOWER EXTREMITY VENOUS  DOPPLER ULTRASOUND TECHNIQUE: Gray-scale sonography with  graded compression, as well as color Doppler and duplex ultrasound were performed to evaluate the lower extremity deep venous systems from the level of the common femoral vein and including the common femoral, femoral, profunda femoral, popliteal and calf veins including the posterior tibial, peroneal and gastrocnemius veins when visible. The superficial great saphenous vein was also interrogated. Spectral Doppler was utilized to evaluate flow at rest and with distal augmentation maneuvers in the common femoral, femoral and popliteal veins. COMPARISON:  Left tib-fib series earlier the same day. FINDINGS: Contralateral Common Femoral Vein: Respiratory phasicity is normal and symmetric with the symptomatic side. No evidence of thrombus. Normal compressibility. Common Femoral Vein: No evidence of thrombus. Normal compressibility, respiratory phasicity and response to augmentation. Saphenofemoral Junction: No evidence of thrombus. Normal compressibility and flow on color Doppler imaging. Profunda Femoral Vein: No evidence of thrombus. Normal compressibility and flow on color Doppler imaging. Femoral Vein: No evidence of thrombus. Normal compressibility, respiratory phasicity and response to augmentation. Popliteal Vein: No evidence of thrombus. Normal compressibility, respiratory phasicity and response to augmentation. Calf Veins: Limited visualization but no evidence of thrombus. Normal compressibility and flow on color Doppler imaging. Venous Reflux:  None. Other Findings:  None. IMPRESSION: No evidence of left lower extremity deep venous thrombosis. Electronically Signed   By: Genevie Ann M.D.   On: 04/09/2019 00:28    EKG:   Orders placed or performed during the hospital encounter of 04/08/19  . ED EKG 12-Lead  . ED EKG 12-Lead  . EKG 12-Lead  . EKG 12-Lead    ASSESSMENT AND PLAN:   68 year old male with past medical history significant for depression, GERD, sleep apnea presents to hospital secondary to  worsening left leg swelling, erythema and draining wound for almost 3-4 weeks now.  1.  Sepsis-secondary to left lower extremity cellulitis with draining wounds-started after a trauma. -Weighted white count, elevated lactic acid, fever noted -Swollen and erythematous.  Continue IV antibiotics with vancomycin and Zosyn -We will get infectious disease consult.  She has recommended CT to rule out underlying abscess -Surgery has been consulted as well. -Keep the lower extremity elevated for now -Blood cultures and topical wound cultures have been taken.  2.  Paroxysmal atrial fibrillation-happened several years ago due to lack of sleep.  Only 2 episodes.  Transiently was placed on Cardizem and Eliquis.  Patient has stopped taking them for a long time and do not wish to be on them.  3.  GERD-on Protonix  4.  Depression-on Prozac and Wellbutrin  5.  DVT prophylaxis-Lovenox  Independent at baseline   All the records are reviewed and case discussed with Care Management/Social Workerr. Management plans discussed with the patient, family and they are in agreement.  CODE STATUS: Full Code  TOTAL TIME TAKING CARE OF THIS PATIENT: 39 minutes.   POSSIBLE D/C IN 2 DAYS, DEPENDING ON CLINICAL CONDITION.   Gladstone Lighter M.D on 04/09/2019 at 2:58 PM  Between 7am to 6pm - Pager - [phone]  After 6pm go to [email or web address] - password [email or web address]  Office  [phone]  CC: Primary care physician; Sharyne Peach, MD

## 2019-04-09 NOTE — ED Notes (Signed)
Pt leg wound swabbed, cleaned and wrapped at this time

## 2019-04-09 NOTE — Progress Notes (Signed)
Pharmacy Antibiotic Note  Benjamin [name] is a 68 y.o. male admitted on 04/08/2019 with cellulitis.  Pharmacy has been consulted for vanc/zosyn dosing. Patient received two vanc 1g doses for a total of 2g IV load, ceftriaxone 2g IV x 1.  Plan: Patient is being switch to zosyn from ceftriaxone  Vancomycin 1750 mg IV Q 24 hrs. Goal AUC 400-550. Expected AUC: 523.8 SCr used: 1.21 Cssmin: 12.1  Will start zosyn 3.375g IV q8h per CrCl > 20 ml/min. Will continue to monitor renal fx.   Height: 5\' 8"  (172.7 cm) Weight: 280 lb (127 kg) IBW/kg (Calculated) : 68.4  Temp (24hrs), Avg:100.6 F (38.1 C), Min:100.6 F (38.1 C), Max:100.6 F (38.1 C)  Recent Labs  Lab 04/08/19 2101 04/08/19 2102  WBC  --  18.2*  CREATININE  --  1.21  LATICACIDVEN 1.9  --     Estimated Creatinine Clearance: 78 mL/min (by C-G formula based on SCr of 1.21 mg/dL).    Allergies  Allergen Reactions  . Pramipexole Itching    Thank you for allowing pharmacy to be a part of this patient's care.  Tobie Lords, PharmD, BCPS Clinical Pharmacist 04/09/2019

## 2019-04-09 NOTE — H&P (Addendum)
[facility] [place] NAME: Benjamin [name]    MR#:  [id number]  DATE OF BIRTH:  1951/06/16  DATE OF ADMISSION:  04/08/2019  PRIMARY CARE PHYSICIAN: Sharyne Peach, MD   REQUESTING/REFERRING PHYSICIAN: Marjean Donna, MD  CHIEF COMPLAINT:   Chief Complaint  Patient presents with  . Leg Injury    HISTORY OF PRESENT ILLNESS:  Benjamin [name]  is a 68 y.o. Caucasian male with a known history of depression, GERD and obstructive sleep apnea, who presented to the emergency room with a concern of left leg swelling with pain, erythema and warmth and draining wound after having an injury about 3 weeks ago when a tree that he cut accidentally fell from his truck and hit his shin.  The patient developed fever tonight 102 with associated chills.  He admitted to mild dizziness.  No paresthesias or focal muscle weakness.  No chest pain or dyspnea or cough or wheezing or hemoptysis.  No dysuria oliguria or hematuria or flank pain.  He has been having urinary urgency though.  No recent sick exposures.  His COVID-19 test came back negative.  Upon presentation to the emergency room, temperature was 100.6 and heart rate was 128 with otherwise normal vital signs.  Labs were remarkable for significant leukocytosis of 18.2 with neutrophilia and a creatinine of 1.21.  X-ray of the left leg revealed diffuse soft tissue swelling without acute osseous abnormality.  Venous duplex revealed no evidence for DVT.  The patient was given IV vancomycin and ceftriaxone in the ER.  He will be admitted to a medical bed for further evaluation and management PAST MEDICAL HISTORY:   Past Medical History:  Diagnosis Date  . Depression   . GERD (gastroesophageal reflux disease)   . OSA (obstructive sleep apnea)     PAST SURGICAL HISTORY:   S/P lumbar laminectomy for spinal cord decompression  SOCIAL HISTORY:   Social History   Tobacco Use  . Smoking status: Never  Smoker  . Smokeless tobacco: Never Used  Substance Use Topics  . Alcohol use: Yes    FAMILY HISTORY:   Family History  Problem Relation Age of Onset  . CAD Neg Hx   . Diabetes Mellitus II Neg Hx     DRUG ALLERGIES:   Allergies  Allergen Reactions  . Pramipexole Itching    REVIEW OF SYSTEMS:   ROS As per history of present illness. All pertinent systems were reviewed above. Constitutional,  HEENT, cardiovascular, respiratory, GI, GU, musculoskeletal, neuro, psychiatric, endocrine,  integumentary and hematologic systems were reviewed and are otherwise  negative/unremarkable except for positive findings mentioned above in the HPI.   MEDICATIONS AT HOME:   Prior to Admission medications   Medication Sig Start Date End Date Taking? Authorizing Provider  buPROPion (WELLBUTRIN XL) 150 MG 24 hr tablet Take 300 mg by mouth daily. 01/20/16  Yes [provider]  FLUoxetine (PROZAC) 20 MG capsule Take 20 mg by mouth every morning. 03/04/18  Yes [provider]  pantoprazole (PROTONIX) 40 MG tablet Take 40 mg by mouth daily. 01/23/16  Yes [provider]  ropinirole (REQUIP) 5 MG tablet Take 15 mg by mouth at bedtime.  03/11/19  Yes [provider]  testosterone cypionate (DEPOTESTOSTERONE CYPIONATE) 200 MG/ML injection Inject 200 mg into the muscle every 14 (fourteen) days. 03/28/18  Yes [provider]  traZODone (DESYREL) 50 MG tablet Take 50 mg by mouth at bedtime as needed. 04/19/18  Yes  [provider]  apixaban (ELIQUIS) 5 MG TABS tablet Take 1 tablet (5 mg total) by mouth 2 (two) times daily. 05/07/18   Demetrios Loll, MD  fluticasone [facility]) 50 MCG/ACT nasal spray Place 2 sprays into both nostrils daily. 03/10/18   [provider]  oxyCODONE-acetaminophen (PERCOCET) 10-325 MG tablet Take 1 tablet by mouth every 6 (six) hours as needed for pain. Patient not taking: Reported on 04/09/2019 07/26/18 07/26/19  Gregor Hams, MD       VITAL SIGNS:  Blood pressure 106/64, pulse (!) 114, temperature (!) 100.6 F (38.1 C), temperature source Oral, resp. rate (!) 26, height 5\' 8"  (1.727 m), weight 127 kg, SpO2 95 %.  PHYSICAL EXAMINATION:  Physical Exam  GENERAL:  68 y.o.-year-old Caucasian male patient lying in the bed with no acute distress.  EYES: Pupils equal, round, reactive to light and accommodation. No scleral icterus. Extraocular muscles intact.  HEENT: Head atraumatic, normocephalic. Oropharynx and nasopharynx clear.  NECK:  Supple, no jugular venous distention. No thyroid enlargement, no tenderness.  LUNGS: Normal breath sounds bilaterally, no wheezing, rales,rhonchi or crepitation. No use of accessory muscles of respiration.  CARDIOVASCULAR: Regular rate and rhythm, S1, S2 normal. No murmurs, rubs, or gallops.  ABDOMEN: Soft, nondistended, nontender. Bowel sounds present. No organomegaly or mass.  EXTREMITIES: Significant left lower extremity swelling pitting edema more than the right, with no cyanosis, or clubbing.  NEUROLOGIC: Cranial nerves II through XII are intact. Muscle strength 5/5 in all extremities. Sensation intact. Gait not checked.  PSYCHIATRIC: The patient is alert and oriented x 3.  Normal affect and good eye contact. SKIN: Left anterior leg erythema with induration, open wound with serosanguineous drainage, with associated warmth and tenderness.  LABORATORY PANEL:   CBC Recent Labs  Lab 04/08/19 2102  WBC 18.2*  HGB 12.7*  HCT 40.4  PLT 337   ------------------------------------------------------------------------------------------------------------------  Chemistries  Recent Labs  Lab 04/08/19 2102  NA 136  K 4.1  CL 104  CO2 23  GLUCOSE 124*  BUN 18  CREATININE 1.21  CALCIUM 8.8*  AST 21  ALT 21  ALKPHOS 68  BILITOT 0.7   ------------------------------------------------------------------------------------------------------------------  Cardiac Enzymes No results for  input(s): TROPONINI in the last 168 hours. ------------------------------------------------------------------------------------------------------------------  RADIOLOGY:  Dg Tibia/fibula Left  Result Date: 04/08/2019 CLINICAL DATA:  Swelling EXAM: LEFT TIBIA AND FIBULA - 2 VIEW COMPARISON:  None. FINDINGS: Diffuse soft tissue swelling. No fracture or malalignment. No periostitis. No soft tissue emphysema. 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 Ossicle adjacent to the fibular malleolus IMPRESSION: Diffuse soft tissue swelling without acute osseous abnormality Electronically Signed   By: Donavan Foil M.D.   On: 04/08/2019 23:10   US Venous Img Lower Unilateral Left  Result Date: 04/09/2019 CLINICAL DATA:  68 year old male with left leg pain redness and swelling after an injury that occurred several weeks ago. EXAM: LEFT LOWER EXTREMITY VENOUS DOPPLER ULTRASOUND TECHNIQUE: Gray-scale sonography with graded compression, as well as color Doppler and duplex ultrasound were performed to evaluate the lower extremity deep venous systems from the level of the common femoral vein and including the common femoral, femoral, profunda femoral, popliteal and calf veins including the posterior tibial, peroneal and gastrocnemius veins when visible. The superficial great saphenous vein was also interrogated. Spectral Doppler was utilized to evaluate flow at rest and with distal augmentation maneuvers in the common femoral, femoral and popliteal veins. COMPARISON:  Left tib-fib series earlier the same day. FINDINGS: Contralateral Common Femoral Vein: Respiratory phasicity is normal and  symmetric with the symptomatic side. No evidence of thrombus. Normal compressibility. Common Femoral Vein: No evidence of thrombus. Normal compressibility, respiratory phasicity and response to augmentation. Saphenofemoral Junction: No evidence of thrombus. Normal compressibility and flow on color Doppler imaging. Profunda Femoral Vein: No evidence of  thrombus. Normal compressibility and flow on color Doppler imaging. Femoral Vein: No evidence of thrombus. Normal compressibility, respiratory phasicity and response to augmentation. Popliteal Vein: No evidence of thrombus. Normal compressibility, respiratory phasicity and response to augmentation. Calf Veins: Limited visualization but no evidence of thrombus. Normal compressibility and flow on color Doppler imaging. Venous Reflux:  None. Other Findings:  None. IMPRESSION: No evidence of left lower extremity deep venous thrombosis. Electronically Signed   By: Genevie Ann M.D.   On: 04/09/2019 00:28      IMPRESSION AND PLAN:   1.  Sepsis without severe sepsis or septic shock, secondary to left leg cellulitis due to infected anterior leg wound.  The patient will be admitted to a medical bed.  We will continue him on IV vancomycin and Zosyn.  We will obtain wound culture and sensitivity.  Will obtain vascular surgery consult for further assessment and follow-up for compartment syndrome.  Will follow lactic acid.  2.  Depression.  Wellbutrin XL and Prozac will be resumed.  3.  GERD.  PPI will be resumed.  4.  History of paroxysmal atrial fibrillation.  Eliquis will be continued and check EKG.  5.  Obstructive sleep apnea.  Will be continued on his CPAP.  6.  Restless leg syndrome.  Requip will be resumed.  7.  DVT prophylaxis.  Eliquis will be continued.  All the records are reviewed and case discussed with ED provider. The plan of care was discussed in details with the patient.  I answered all questions. The patient agreed to proceed with the above mentioned plan. Further management will depend upon hospital course.   CODE STATUS: Full code  TOTAL TIME TAKING CARE OF THIS PATIENT: 60 minutes.    Christel Mormon M.D on 04/09/2019 at [place] AM  Pager - [phone]  After 6pm go to [email or web address] - Proofreader  [facility]  Office  [phone]  CC: Primary  care physician; Sharyne Peach, MD   Note: This dictation was prepared with Dragon dictation along with smaller phrase technology. Any transcriptional errors that result from this process are unintentional.

## 2019-04-09 NOTE — Consult Note (Signed)
NAME: Benjamin [name]  DOB: Jul 17, 1951  MRN: [id number]  Date/Time: 04/09/2019 3:37 PM  REQUESTING PROVIDER: Tressia Miners Subjective:  REASON FOR CONSULT: leg wound ? Benjamin [name] is a 68 y.o. male is admitted with fever and painful left leg wound. Pt says on 4/29 when he was cutting a tree in his yard a branch fell on his leg thru his jean. There was hardly any blood, but a scratch. The leg became blue and black the next day and was swollen. Over the next 2-3 weeks the swelling gradually reduced until there was a small patch of area on the shin which was oozing some fluid and was discolored. He covered it with dressing . 3 days ago he went to [employer] to get more dressing and got an new non occlusive dressing and applied. Within 24 hrs when he removed the dressing he saw a whole lot of fluid draining and then after a few hours his leg was hurting, was red and temp was 100. It then became 102 and his wife drove him to the ED on 5/21/at 8pm. For the past month patient did not seek any medical help. He did not take any antibiotics .on questioning he says his dog would have licked the wound a few times. He does not have DM. In the ED his vitals were 100.6, BP 131/57 and HR 128. Labs revealed  a wbc of 18.5, Hb 12.7 and PLT 337. Blood and wound culture  were sent- Doppler reveled no DVT, was seen by Vascular. He was started on vancomycin and ceftriaxone and the latter was changed to zosyn and I am asked to see the patient  Past Medical History:  Diagnosis Date  . Depression   . GERD (gastroesophageal reflux disease)   . OSA (obstructive sleep apnea)    Degenerative disc disease actinic changes skin Restless leg syndrome  PSH Lumbar fusion surgery X 2    SH Non smoker occasional alcohol Retired Higher education careers adviser   Family History  Problem Relation Age of Onset  . CAD Neg Hx   . Diabetes Mellitus II Neg Hx    Allergies  Allergen Reactions  . Pramipexole Itching    ? Current  Facility-Administered Medications  Medication Dose Route Frequency Provider Last Rate Last Dose  . 0.9 %  sodium chloride infusion   Intravenous Continuous Mansy, Jan A, MD 100 mL/hr at 04/09/19 1505    . acetaminophen (TYLENOL) tablet 650 mg  650 mg Oral Q6H PRN Mansy, Jan A, MD       Or  . acetaminophen (TYLENOL) suppository 650 mg  650 mg Rectal Q6H PRN Mansy, Jan A, MD      . buPROPion (WELLBUTRIN XL) 24 hr tablet 300 mg  300 mg Oral Daily Mansy, Jan A, MD   300 mg at 04/09/19 4259  . enoxaparin (LOVENOX) injection 40 mg  40 mg Subcutaneous Q12H Gladstone Lighter, MD   40 mg at 04/09/19 1502  . FLUoxetine (PROZAC) capsule 20 mg  20 mg Oral BH-q7a Mansy, Jan A, MD   20 mg at 04/09/19 0716  . fluticasone (FLONASE) 50 MCG/ACT nasal spray 2 spray  2 spray Each Nare Daily PRN Mansy, Jan A, MD      . magnesium hydroxide (MILK OF MAGNESIA) suspension 30 mL  30 mL Oral Daily PRN Mansy, Jan A, MD      . morphine 2 MG/ML injection 2 mg  2 mg Intravenous Q3H PRN Mansy, Arvella Merles, MD      .  ondansetron (ZOFRAN) tablet 4 mg  4 mg Oral Q6H PRN Mansy, Jan A, MD       Or  . ondansetron [facility]) injection 4 mg  4 mg Intravenous Q6H PRN Mansy, Jan A, MD      . oxyCODONE-acetaminophen (PERCOCET/ROXICET) 5-325 MG per tablet 1 tablet  1 tablet Oral Q6H PRN Mansy, Jan A, MD   1 tablet at 04/09/19 0302   And  . oxyCODONE (Oxy IR/ROXICODONE) immediate release tablet 5 mg  5 mg Oral Q6H PRN Mansy, Jan A, MD      . pantoprazole (PROTONIX) EC tablet 40 mg  40 mg Oral Daily Mansy, Jan A, MD   40 mg at 04/09/19 4431  . piperacillin-tazobactam (ZOSYN) IVPB 3.375 g  3.375 g Intravenous Q8H Mansy, Jan A, MD 12.5 mL/hr at 04/09/19 1507 3.375 g at 04/09/19 1507  . rOPINIRole (REQUIP) tablet 4 mg  4 mg Oral TID Mansy, Jan A, MD   4 mg at 04/09/19 1014  . sodium chloride flush (NS) 0.9 % injection 3 mL  3 mL Intravenous Once Gregor Hams, MD      . testosterone cypionate (DEPOTESTOSTERONE CYPIONATE) injection 200 mg  200 mg  Intramuscular Q14 Days Mansy, Jan A, MD      . traZODone (DESYREL) tablet 50 mg  50 mg Oral QHS PRN Mansy, Jan A, MD   50 mg at 04/09/19 0526  . [START ON 04/10/2019] vancomycin (VANCOCIN) 1,750 mg in sodium chloride 0.9 % 500 mL IVPB  1,750 mg Intravenous Q24H Mansy, Jan A, MD         Abtx:  Anti-infectives (From admission, onward)   Start     Dose/Rate Route Frequency Ordered Stop   04/10/19 0100  vancomycin (VANCOCIN) 1,750 mg in sodium chloride 0.9 % 500 mL IVPB     1,750 mg 250 mL/hr over 120 Minutes Intravenous Every 24 hours 04/09/19 0507     04/09/19 0300  piperacillin-tazobactam (ZOSYN) IVPB 3.375 g     3.375 g 12.5 mL/hr over 240 Minutes Intravenous Every 8 hours 04/09/19 0132     04/09/19 0100  vancomycin (VANCOCIN) 2,000 mg in sodium chloride 0.9 % 500 mL IVPB     2,000 mg 250 mL/hr over 120 Minutes Intravenous  Once 04/09/19 0052 04/09/19 0506   04/09/19 0045  vancomycin (VANCOCIN) IVPB 1000 mg/200 mL premix  Status:  Discontinued     1,000 mg 200 mL/hr over 60 Minutes Intravenous  Once 04/09/19 0040 04/09/19 0052   04/09/19 0045  cefTRIAXone (ROCEPHIN) 2 g in sodium chloride 0.9 % 100 mL IVPB     2 g 200 mL/hr over 30 Minutes Intravenous  Once 04/09/19 0040 04/09/19 0148      REVIEW OF SYSTEMS:  Const:  fever,  chills, negative weight loss Eyes: negative diplopia or visual changes, negative eye pain ENT: negative coryza, negative sore throat Resp: negative cough, hemoptysis, dyspnea Cards: negative for chest pain, palpitations, ++lower extremity edema GU: ++ frequency, ++ urgency , no dysuria and hematuria GI: Negative for abdominal pain, diarrhea, bleeding, constipation Skin: negative for rash and pruritus Heme: negative for easy bruising and gum/nose bleeding MS: generalized ache and weakness Neurolo:negative for headaches, dizziness, vertigo, memory problems  Psych:  depression  Endocrine: negative for thyroid, diabetes issues Allergy/Immunology- pramipexole-  Objective:  VITALS:  BP 136/74 (BP Location: Right Arm)   Pulse 84   Temp 98.8 F (37.1 C)   Resp 20   Ht 5\' 8"  (1.727 m)  Wt 127 kg   SpO2 98%   BMI 42.57 kg/m  PHYSICAL EXAM:  General: Alert, cooperative, no distress, appears stated age.  Head: Normocephalic, without obvious abnormality, atraumatic. Eyes: Conjunctivae clear, anicteric sclerae. Pupils are equal ENT Nares normal. No drainage or sinus tenderness. Lips, mucosa, and tongue normal. No Thrush Neck: Supple, symmetrical, no adenopathy, thyroid: non tender no carotid bruit and no JVD. Back: No CVA tenderness. Lungs: Clear to auscultation bilaterally. No Wheezing or Rhonchi. No rales. Heart: Regular rate and rhythm, no murmur, rub or gallop. Abdomen: Soft, non-tender,not distended. Bowel sounds normal. No masses Extremities: left eg swollen- on the shin there is erythema and induration for 8 cm- with a wound in the center with a scab- discharging serosanguinous fluid   Some fluctuance present Skin: No rashes or lesions. Or bruising Lymph: Cervical, supraclavicular normal. Neurologic: Grossly non-focal Pertinent Labs Lab Results CBC    Component Value Date/Time   WBC 20.5 (H) 04/09/2019 0500   RBC 5.00 04/09/2019 0500   HGB 12.1 (L) 04/09/2019 0500   HCT 40.2 04/09/2019 0500   PLT 336 04/09/2019 0500   MCV 80.4 04/09/2019 0500   MCH 24.2 (L) 04/09/2019 0500   MCHC 30.1 04/09/2019 0500   RDW 17.0 (H) 04/09/2019 0500   LYMPHSABS 1.2 04/08/2019 2102   MONOABS 1.4 (H) 04/08/2019 2102   EOSABS 0.1 04/08/2019 2102   BASOSABS 0.1 04/08/2019 2102    CMP Latest Ref Rng & Units 04/09/2019 04/08/2019 05/06/2018  Glucose 70 - 99 mg/dL 174(H) 124(H) 117(H)  BUN 8 - 23 mg/dL 15 18 15   Creatinine 0.61 - 1.24 mg/dL 1.07 1.21 1.06  Sodium 135 - 145 mmol/L 135 136 136  Potassium 3.5 - 5.1 mmol/L 3.9 4.1 4.8  Chloride 98 - 111 mmol/L 109 104 104  CO2 22 - 32 mmol/L 17(L) 23 25  Calcium 8.9 - 10.3 mg/dL 7.5(L) 8.8(L)  8.7(L)  Total Protein 6.5 - 8.1 g/dL - 6.9 -  Total Bilirubin 0.3 - 1.2 mg/dL - 0.7 -  Alkaline Phos 38 - 126 U/L - 68 -  AST 15 - 41 U/L - 21 -  ALT 0 - 44 U/L - 21 -      Microbiology: Recent Results (from the past 240 hour(s))  Blood Culture (routine x 2)     Status: None (Preliminary result)   Collection Time: 04/09/19 12:40 AM  Result Value Ref Range Status   Specimen Description BLOOD RIGHT ANTECUBITAL  Final   Special Requests   Final    BOTTLES DRAWN AEROBIC AND ANAEROBIC Blood Culture results may not be optimal due to an excessive volume of blood received in culture bottles   Culture   Final    NO GROWTH < 12 HOURS Performed at [facility], [address], [place], [place] [address]    Report Status PENDING  Incomplete  SARS Coronavirus 2 (CEPHEID - Performed in [place] hospital lab), Hosp Order     Status: None   Collection Time: 04/09/19 12:41 AM  Result Value Ref Range Status   SARS Coronavirus 2 NEGATIVE NEGATIVE Final    Comment: (NOTE) If result is NEGATIVE SARS-CoV-2 target nucleic acids are NOT DETECTED. The SARS-CoV-2 RNA is generally detectable in upper and lower  respiratory specimens during the acute phase of infection. The lowest  concentration of SARS-CoV-2 viral copies this assay can detect is 250  copies / mL. A negative result does not preclude SARS-CoV-2 infection  and should not be used as  the sole basis for treatment or other  patient management decisions.  A negative result may occur with  improper specimen collection / handling, submission of specimen other  than nasopharyngeal swab, presence of viral mutation(s) within the  areas targeted by this assay, and inadequate number of viral copies  (<250 copies / mL). A negative result must be combined with clinical  observations, patient history, and epidemiological information. If result is POSITIVE SARS-CoV-2 target nucleic acids are DETECTED. The SARS-CoV-2 RNA is generally  detectable in upper and lower  respiratory specimens dur ing the acute phase of infection.  Positive  results are indicative of active infection with SARS-CoV-2.  Clinical  correlation with patient history and other diagnostic information is  necessary to determine patient infection status.  Positive results do  not rule out bacterial infection or co-infection with other viruses. If result is PRESUMPTIVE POSTIVE SARS-CoV-2 nucleic acids MAY BE PRESENT.   A presumptive positive result was obtained on the submitted specimen  and confirmed on repeat testing.  While 2019 novel coronavirus  (SARS-CoV-2) nucleic acids may be present in the submitted sample  additional confirmatory testing may be necessary for epidemiological  and / or clinical management purposes  to differentiate between  SARS-CoV-2 and other Sarbecovirus currently known to infect humans.  If clinically indicated additional testing with an alternate test  methodology [phone]) is advised. The SARS-CoV-2 RNA is generally  detectable in upper and lower respiratory sp ecimens during the acute  phase of infection. The expected result is Negative. Fact Sheet for Patients:  StrictlyIdeas.no Fact Sheet for Healthcare Providers: BankingDealers.co.za This test is not yet approved or cleared by the [place] FDA and has been authorized for detection and/or diagnosis of SARS-CoV-2 by FDA under an Emergency Use Authorization (EUA).  This EUA will remain in effect (meaning this test can be used) for the duration of the COVID-19 declaration under Section 564(b)(1) of the Act, 21 U.S.C. section 360bbb-3(b)(1), unless the authorization is terminated or revoked sooner. Performed at [facility], [address] [place], [place] [address]   Blood Culture (routine x 2)     Status: None (Preliminary result)   Collection Time: 04/09/19 12:45 AM  Result Value Ref Range Status    Specimen Description BLOOD BLOOD RIGHT FOREARM  Final   Special Requests   Final    BOTTLES DRAWN AEROBIC AND ANAEROBIC Blood Culture adequate volume   Culture   Final    NO GROWTH < 12 HOURS Performed at [facility], [address] [place], [place] [address]    Report Status PENDING  Incomplete  Aerobic Culture (superficial specimen)     Status: None (Preliminary result)   Collection Time: 04/09/19  7:23 AM  Result Value Ref Range Status   Specimen Description   Final    WOUND LEG Performed at [facility], [place] [address], [place], [place] [address]    Special Requests   Final    Normal Performed at [place], [place] [address]    Gram Stain   Final    MODERATE WBC PRESENT, PREDOMINANTLY PMN MODERATE GRAM POSITIVE COCCI IN PAIRS IN CLUSTERS RARE GRAM NEGATIVE RODS Performed at [facility], [place] [address], [place], [place] [address]    Culture PENDING  Incomplete   Report Status PENDING  Incomplete    IMAGING RESULTS:  I have personally reviewed the films ? Impression/Recommendation ?68 y.o. male is admitted with fever and  painful left leg wound. Pt says on 4/29 when he was cutting a tree in his yard a branch fell on his leg thru his jean. There was hardly any blood, but a scratch. The leg became blue and black the next day and was swollen. Over the next 2-3 weeks the swelling gradually reduced until there was a small patch of area on the shin which was oozing some fluid and was discolored. He covered it with dressing . 3 days ago he went to [employer] to get more dressing and got an new non occlusive dressing and applied. Within 24 hrs when he removed the dressing he saw a whole lot of fluid draining and then after a few hours his leg was hurting, was red and temp was 100. It then became 102 and his wife drove him to the ED on 5/21/at 8pm. For the past month patient did not seek any medical help. He did not take any  antibiotics .on questioning he says his dog would have licked the wound a few times ? ? Left leg wound with surrounding cellulitis .following an injury 3 weeks ago. This would have started as hematoma and now it is secondarily infected. Concerning  for an underlying abscess Usual organisms are strep, staph but because of exposure to vegetation- gram negatives, nocardia are in the D.D also unusual organisms like sporotrichosis, fungal remotely possible. As he has been licked by his dog oral organisms like pasteurella   possible. Currently on vanco and zosyn- will closely observe his cr on this combo.will adjust antibiotics once culture availabe Seen by vascular  CT leg was done this afternoon and it shows Soft tissue ulceration anteriorly in the mid lower leg with ill-defined underlying fluid in the subcutaneous fat. No well-defined abscess.No osteo Observe closely as he may need drainage.   Symptoms of BPH   ___________________________________________________ Discussed with patient, and requesting provider ID will follow peripherally this weekend- call if needed

## 2019-04-09 NOTE — ED Notes (Signed)
.. ED TO INPATIENT HANDOFF REPORT  ED Nurse Name and Phone #: Deneise Lever [phone]  L Name/Age/Gender Benjamin [name] 68 y.o. male Room/Bed: ED19A/ED19A  Code Status   Code Status: Full Code  Home/SNF/Other Home Patient oriented to: self, place, time and situation Is this baseline? Yes   Triage Complete: Triage complete  Chief Complaint Leg Injury   Triage Note Patient states he hurt his left leg 3-4 weeks ago. Denies seeing any open wound at the time. States he kept area clean with soap and water. Patient states yesterday, he noticed a small wound in middle of left shin with serosanguinous fluid. Patient states he also had fever of 102 at home. Patient denies cough or congestion. Patient states he also has urinary urgency that has worsened today. Reports dizziness that started today.    Allergies Allergies  Allergen Reactions  . Pramipexole Itching    Level of Care/Admitting Diagnosis ED Disposition    ED Disposition Condition [facility] Area: [place] [[id number]]  Level of Care: Med-Surg [16]  Covid Evaluation: N/A  Diagnosis: Lower extremity cellulitis [937902]  Admitting Physician: Christel Mormon [[id number]]  Attending Physician: Christel Mormon [[id number]]  Estimated length of stay: past midnight tomorrow  Certification:: I certify this patient will need inpatient services for at least 2 midnights  PT Class (Do Not Modify): Inpatient [101]  PT Acc Code (Do Not Modify): Private [1]       B Medical/Surgery History Past Medical History:  Diagnosis Date  . Depression   . GERD (gastroesophageal reflux disease)   . OSA (obstructive sleep apnea)    Past Surgical History:  Procedure Laterality Date  . none       A IV Location/Drains/Wounds Patient Lines/Drains/Airways Status   Active Line/Drains/Airways    Name:   Placement date:   Placement time:   Site:   Days:   Peripheral IV 04/09/19 Left Antecubital   04/09/19    0034     Antecubital   less than 1   Peripheral IV 04/09/19 Right Hand   04/09/19    0045    Hand   less than 1          Intake/Output Last 24 hours  Intake/Output Summary (Last 24 hours) at 04/09/2019 0154 Last data filed at 04/09/2019 0149 Gross per 24 hour  Intake 291.96 ml  Output -  Net 291.96 ml    Labs/Imaging Results for orders placed or performed during the hospital encounter of 04/08/19 (from the past 48 hour(s))  Lactic acid, plasma     Status: None   Collection Time: 04/08/19  9:01 PM  Result Value Ref Range   Lactic Acid, Venous 1.9 0.5 - 1.9 mmol/L    Comment: Performed at [facility], [address] [place], [place] [address]  Comprehensive metabolic panel     Status: Abnormal   Collection Time: 04/08/19  9:02 PM  Result Value Ref Range   Sodium 136 135 - 145 mmol/L   Potassium 4.1 3.5 - 5.1 mmol/L   Chloride 104 98 - 111 mmol/L   CO2 23 22 - 32 mmol/L   Glucose, Bld 124 (H) 70 - 99 mg/dL   BUN 18 8 - 23 mg/dL   Creatinine, Ser 1.21 0.61 - 1.24 mg/dL   Calcium 8.8 (L) 8.9 - 10.3 mg/dL   Total Protein 6.9 6.5 - 8.1 g/dL   Albumin 3.9 3.5 - 5.0 g/dL   AST 21 15 -  41 U/L   ALT 21 0 - 44 U/L   Alkaline Phosphatase 68 38 - 126 U/L   Total Bilirubin 0.7 0.3 - 1.2 mg/dL   GFR calc non Af Amer >60 >60 mL/min   GFR calc Af Amer >60 >60 mL/min   Anion gap 9 5 - 15    Comment: Performed at [facility], [address] [place], [place] [address]  CBC with Differential     Status: Abnormal   Collection Time: 04/08/19  9:02 PM  Result Value Ref Range   WBC 18.2 (H) 4.0 - 10.5 K/uL   RBC 5.11 4.22 - 5.81 MIL/uL   Hemoglobin 12.7 (L) 13.0 - 17.0 g/dL   HCT 40.4 39.0 - 52.0 %   MCV 79.1 (L) 80.0 - 100.0 fL   MCH 24.9 (L) 26.0 - 34.0 pg   MCHC 31.4 30.0 - 36.0 g/dL   RDW 16.9 (H) 11.5 - 15.5 %   Platelets 337 150 - 400 K/uL   nRBC 0.0 0.0 - 0.2 %   Neutrophils Relative % 85 %   Neutro Abs 15.4 (H) 1.7 - 7.7 K/uL   Lymphocytes Relative 6 %   Lymphs  Abs 1.2 0.7 - 4.0 K/uL   Monocytes Relative 8 %   Monocytes Absolute 1.4 (H) 0.1 - 1.0 K/uL   Eosinophils Relative 0 %   Eosinophils Absolute 0.1 0.0 - 0.5 K/uL   Basophils Relative 0 %   Basophils Absolute 0.1 0.0 - 0.1 K/uL   Immature Granulocytes 1 %   Abs Immature Granulocytes 0.09 (H) 0.00 - 0.07 K/uL    Comment: Performed at [facility], [address] [place], [place] [address]  Urinalysis, Complete w Microscopic     Status: Abnormal   Collection Time: 04/08/19  9:02 PM  Result Value Ref Range   Color, Urine YELLOW (A) YELLOW   APPearance CLEAR (A) CLEAR   Specific Gravity, Urine 1.016 1.005 - 1.030   pH 7.0 5.0 - 8.0   Glucose, UA NEGATIVE NEGATIVE mg/dL   Hgb urine dipstick NEGATIVE NEGATIVE   Bilirubin Urine NEGATIVE NEGATIVE   Ketones, ur NEGATIVE NEGATIVE mg/dL   Protein, ur NEGATIVE NEGATIVE mg/dL   Nitrite NEGATIVE NEGATIVE   Leukocytes,Ua SMALL (A) NEGATIVE   RBC / HPF 0-5 0 - 5 RBC/hpf   WBC, UA 0-5 0 - 5 WBC/hpf   Bacteria, UA NONE SEEN NONE SEEN   Squamous Epithelial / LPF NONE SEEN 0 - 5   Mucus PRESENT     Comment: Performed at [facility], [address] [place], [place] [address]   Dg Tibia/fibula Left  Result Date: 04/08/2019 CLINICAL DATA:  Swelling EXAM: LEFT TIBIA AND FIBULA - 2 VIEW COMPARISON:  None. FINDINGS: Diffuse soft tissue swelling. No fracture or malalignment. No periostitis. No soft tissue emphysema. 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 [place]ssicle adjacent to the fibular malleolus IMPRESSION: Diffuse soft tissue swelling without acute osseous abnormality Electronically Signed   By: Donavan Foil M.D.   On: 04/08/2019 23:10   US Venous Img Lower Unilateral Left  Result Date: 04/09/2019 CLINICAL DATA:  68 year old male with left leg pain redness and swelling after an injury that occurred several weeks ago. EXAM: LEFT LOWER EXTREMITY VENOUS DOPPLER ULTRASOUND TECHNIQUE: Gray-scale sonography with graded compression, as well as color  Doppler and duplex ultrasound were performed to evaluate the lower extremity deep venous systems from the level of the common femoral vein and including the common femoral, femoral, profunda femoral,  popliteal and calf veins including the posterior tibial, peroneal and gastrocnemius veins when visible. The superficial great saphenous vein was also interrogated. Spectral Doppler was utilized to evaluate flow at rest and with distal augmentation maneuvers in the common femoral, femoral and popliteal veins. COMPARISON:  Left tib-fib series earlier the same day. FINDINGS: Contralateral Common Femoral Vein: Respiratory phasicity is normal and symmetric with the symptomatic side. No evidence of thrombus. Normal compressibility. Common Femoral Vein: No evidence of thrombus. Normal compressibility, respiratory phasicity and response to augmentation. Saphenofemoral Junction: No evidence of thrombus. Normal compressibility and flow on color Doppler imaging. Profunda Femoral Vein: No evidence of thrombus. Normal compressibility and flow on color Doppler imaging. Femoral Vein: No evidence of thrombus. Normal compressibility, respiratory phasicity and response to augmentation. Popliteal Vein: No evidence of thrombus. Normal compressibility, respiratory phasicity and response to augmentation. Calf Veins: Limited visualization but no evidence of thrombus. Normal compressibility and flow on color Doppler imaging. Venous Reflux:  None. Other Findings:  None. IMPRESSION: No evidence of left lower extremity deep venous thrombosis. Electronically Signed   By: Genevie Ann M.D.   On: 04/09/2019 00:28    Pending Labs Unresulted Labs (From admission, onward)    Start     Ordered   04/09/19 3710  Basic metabolic panel  Tomorrow morning,   STAT     04/09/19 0132   04/09/19 0500  CBC  Tomorrow morning,   STAT     04/09/19 0132   04/09/19 0041  SARS Coronavirus 2 (CEPHEID - Performed in [place] hospital lab), Hosp Order   (Asymptomatic Patients Labs)  Once,   STAT    Question:  Rule Out  Answer:  Yes   04/09/19 0040   04/09/19 0040  Blood Culture (routine x 2)  BLOOD CULTURE X 2,   STAT     04/09/19 0040   04/09/19 0040  Urine culture  ONCE - STAT,   STAT     04/09/19 0040   04/08/19 2350  Lactic acid, plasma  Once,   R     04/08/19 2350          Vitals/Pain Today's Vitals   04/08/19 2055 04/09/19 0045 04/09/19 0100 04/09/19 0145  BP:  131/70 120/88   Pulse:  (!) 114 97 (!) 113  Resp:  19 16 19   Temp:      TempSrc:      SpO2:  97% 98% 96%  Weight: 127 kg     Height: 5\' 8"  (1.727 m)     PainSc: 0-No pain       Isolation Precautions No active isolations  Medications Medications  sodium chloride flush (NS) 0.9 % injection 3 mL (has no administration in time range)  vancomycin (VANCOCIN) 2,000 mg in sodium chloride 0.9 % 500 mL IVPB (2,000 mg Intravenous New Bag/Given 04/09/19 0148)  oxyCODONE-acetaminophen (PERCOCET) 10-325 MG per tablet 1 tablet (has no administration in time range)  diltiazem (CARDIZEM CD) 24 hr capsule 120 mg (has no administration in time range)  buPROPion (WELLBUTRIN XL) 24 hr tablet 300 mg (has no administration in time range)  FLUoxetine (PROZAC) capsule 20 mg (has no administration in time range)  traZODone (DESYREL) tablet 50 mg (has no administration in time range)  testosterone cypionate (DEPOTESTOSTERONE CYPIONATE) injection 200 mg (has no administration in time range)  pantoprazole (PROTONIX) EC tablet 40 mg (has no administration in time range)  apixaban (ELIQUIS) tablet 5 mg (has no administration in time range)  rOPINIRole (REQUIP) tablet  4 mg (has no administration in time range)  fluticasone (FLONASE) 50 MCG/ACT nasal spray 2 spray (has no administration in time range)  0.9 %  sodium chloride infusion (has no administration in time range)  acetaminophen (TYLENOL) tablet 650 mg (has no administration in time range)    Or  acetaminophen (TYLENOL) suppository  650 mg (has no administration in time range)  magnesium hydroxide (MILK OF MAGNESIA) suspension 30 mL (has no administration in time range)  ondansetron (ZOFRAN) tablet 4 mg (has no administration in time range)    Or  ondansetron (ZOFRAN) injection 4 mg (has no administration in time range)  piperacillin-tazobactam (ZOSYN) IVPB 3.375 g (has no administration in time range)  cefTRIAXone (ROCEPHIN) 2 g in sodium chloride 0.9 % 100 mL IVPB (0 g Intravenous Stopped 04/09/19 0148)    Mobility walks with device Low fall risk   Focused Assessments Cardiac Assessment Handoff:    Lab Results  Component Value Date   TROPONINI <0.03 05/06/2018   No results found for: DDIMER Does the Patient currently have chest pain? No     R Recommendations: See Admitting Provider Note  Report given to:   Additional Notes:

## 2019-04-09 NOTE — Progress Notes (Addendum)
CODE SEPSIS - PHARMACY COMMUNICATION  **Broad Spectrum Antibiotics should be administered within 1 hour of Sepsis diagnosis**  Time Code Sepsis Called/Page Received: 0041  Antibiotics Ordered: vanc/cefepime  Time of 1st antibiotic administration: 0050  Additional action taken by pharmacy: Per ED RN patient received two vanc 1g doses to make a total of 2g. On chart it looks like patient received 3g of vanc, but confirmed w/ RN that patient only received the two 1g vanc for a total of 2g as a load.  If necessary, Name of Provider/Nurse Contacted:     Tobie Lords ,PharmD Clinical Pharmacist  04/09/2019  2:48 AM

## 2019-04-09 NOTE — ED Provider Notes (Signed)
Benjamin [name] __________________________________   First MD Initiated Contact with Patient 04/09/19 0003     (approximate)  I have reviewed the triage vital signs and the nursing notes.   HISTORY  Chief Complaint Leg Injury    HPI Benjamin [name] is a 68 y.o. male with below list of previous medical conditions presents to the emergency department secondary to  left leg pain swelling redness and fever.  Patient states temperature at home was 102.  Patient states approximately 3 weeks ago he injured his leg when a tree struck it.  Patient states that he has been keeping the area clean with soap and water and dressing it appropriately.  However, patient stated considerable drainage from the wound on his left leg today.        Past Medical History:  Diagnosis Date   Depression    GERD (gastroesophageal reflux disease)    OSA (obstructive sleep apnea)     Patient Active Problem List   Diagnosis Date Noted   Lower extremity cellulitis 04/09/2019   A-fib ([place]) 05/06/2018   Atrial fibrillation ([place]) 02/04/2016    Past Surgical History:  Procedure Laterality Date   none      Prior to Admission medications   Medication Sig Start Date End Date Taking? Authorizing Provider  buPROPion (WELLBUTRIN XL) 150 MG 24 hr tablet Take 300 mg by mouth daily. 01/20/16  Yes [provider]  FLUoxetine (PROZAC) 20 MG capsule Take 20 mg by mouth every morning. 03/04/18  Yes [provider]  pantoprazole (PROTONIX) 40 MG tablet Take 40 mg by mouth daily. 01/23/16  Yes [provider]  ropinirole (REQUIP) 5 MG tablet Take 15 mg by mouth at bedtime.  03/11/19  Yes [provider]  testosterone cypionate (DEPOTESTOSTERONE CYPIONATE) 200 MG/ML injection Inject 200 mg into the muscle every 14 (fourteen) days. 03/28/18  Yes [provider]  traZODone (DESYREL) 50 MG tablet Take 50 mg by mouth at  bedtime as needed. 04/19/18  Yes [provider]  apixaban (ELIQUIS) 5 MG TABS tablet Take 1 tablet (5 mg total) by mouth 2 (two) times daily. 05/07/18   Demetrios Loll, MD  fluticasone [facility]) 50 MCG/ACT nasal spray Place 2 sprays into both nostrils daily. 03/10/18   [provider]  oxyCODONE-acetaminophen (PERCOCET) 10-325 MG tablet Take 1 tablet by mouth every 6 (six) hours as needed for pain. Patient not taking: Reported on 04/09/2019 07/26/18 07/26/19  Gregor Hams, MD    Allergies Pramipexole  Family History  Problem Relation Age of Onset   CAD Neg Hx    Diabetes Mellitus II Neg Hx     Social History Social History   Tobacco Use   Smoking status: Never Smoker   Smokeless tobacco: Never Used  Substance Use Topics   Alcohol use: Yes   Drug use: No    Review of Systems Constitutional: Positive for fever Eyes: No visual changes. ENT: No sore throat. Cardiovascular: Denies chest pain. Respiratory: Denies shortness of breath. Gastrointestinal: No abdominal pain.  No nausea, no vomiting.  No diarrhea.  No constipation. Genitourinary: Negative for dysuria. Musculoskeletal: Negative for neck pain.  Negative for back pain.  Today for left leg wound redness, swelling and pain Integumentary: Negative for rash. Neurological: Negative for headaches, focal weakness or numbness.   ____________________________________________   PHYSICAL EXAM:  VITAL SIGNS: ED Triage Vitals  Enc Vitals Group     BP 04/08/19 2054 (!) 131/57  Pulse Rate 04/08/19 2054 (!) 128     Resp 04/08/19 2054 20     Temp 04/08/19 2054 (!) 100.6 F (38.1 C)     Temp Source 04/08/19 2054 Oral     SpO2 04/08/19 2054 95 %     Weight 04/08/19 2055 127 kg (280 lb)     Height 04/08/19 2055 1.727 m (5\' 8" )     Head Circumference --      Peak Flow --      Pain Score 04/08/19 2055 0     Pain Loc --      Pain Edu? --      Excl. in [place]? --     Constitutional: Alert and oriented. Well  appearing and in no acute distress. Eyes: Conjunctivae are normal. Mouth/Throat: Mucous membranes are moist.  Oropharynx non-erythematous. Neck: No stridor.  Cardiovascular: Tachycardia, regular rhythm. Good peripheral circulation. Grossly normal heart sounds. Respiratory: Normal respiratory effort.  No retractions. No audible wheezing. Gastrointestinal: Soft and nontender. No distention.  Musculoskeletal: Anterior left lower extremity wound with mucopurulent drainage.  Large area of blanching erythema just inferior to the knee to two thirds down the patient's left leg consistent with cellulitis.  Area tender to touch. Neurologic:  Normal speech and language. No gross focal neurologic deficits are appreciated.  Skin:  Anterior left lower extremity wound with mucopurulent drainage.  Large area of blanching erythema just inferior to the knee to two thirds down the patient's left leg consistent with cellulitis.  Area tender to touch. Psychiatric: Mood and affect are normal. Speech and behavior are normal.  ____________________________________________   LABS (all labs ordered are listed, but only abnormal results are displayed)  Labs Reviewed  COMPREHENSIVE METABOLIC PANEL - Abnormal; Notable for the following components:      Result Value   Glucose, Bld 124 (*)    Calcium 8.8 (*)    All other components within normal limits  CBC WITH DIFFERENTIAL/PLATELET - Abnormal; Notable for the following components:   WBC 18.2 (*)    Hemoglobin 12.7 (*)    MCV 79.1 (*)    MCH 24.9 (*)    RDW 16.9 (*)    Neutro Abs 15.4 (*)    Monocytes Absolute 1.4 (*)    Abs Immature Granulocytes 0.09 (*)    All other components within normal limits  URINALYSIS, COMPLETE (UACMP) WITH MICROSCOPIC - Abnormal; Notable for the following components:   Color, Urine YELLOW (*)    APPearance CLEAR (*)    Leukocytes,Ua SMALL (*)    All other components within normal limits  SARS CORONAVIRUS 2 (HOSPITAL ORDER,  [facility])  CULTURE, BLOOD (ROUTINE X 2)  CULTURE, BLOOD (ROUTINE X 2)  URINE CULTURE  LACTIC ACID, PLASMA  LACTIC ACID, PLASMA  BASIC METABOLIC PANEL  CBC     RADIOLOGY I, Perry N Jagdeep Ancheta, personally viewed and evaluated these images (plain radiographs) as part of my medical decision making, as well as reviewing the written report by the radiologist.  ED MD interpretation: Left tib-fib x-ray reveals soft tissue swelling however no bony abnormality.  Ultrasound of the left lower extremity revealed no DVT  Official radiology report(s): Dg Tibia/fibula Left  Result Date: 04/08/2019 CLINICAL DATA:  Swelling EXAM: LEFT TIBIA AND FIBULA - 2 VIEW COMPARISON:  None. FINDINGS: Diffuse soft tissue swelling. No fracture or malalignment. No periostitis. No soft tissue emphysema. 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 [place] adjacent to the fibular malleolus IMPRESSION: Diffuse soft tissue swelling without  acute osseous abnormality Electronically Signed   By: Donavan Foil M.D.   On: 04/08/2019 23:10   US Venous Img Lower Unilateral Left  Result Date: 04/09/2019 CLINICAL DATA:  68 year old male with left leg pain redness and swelling after an injury that occurred several weeks ago. EXAM: LEFT LOWER EXTREMITY VENOUS DOPPLER ULTRASOUND TECHNIQUE: Gray-scale sonography with graded compression, as well as color Doppler and duplex ultrasound were performed to evaluate the lower extremity deep venous systems from the level of the common femoral vein and including the common femoral, femoral, profunda femoral, popliteal and calf veins including the posterior tibial, peroneal and gastrocnemius veins when visible. The superficial great saphenous vein was also interrogated. Spectral Doppler was utilized to evaluate flow at rest and with distal augmentation maneuvers in the common femoral, femoral and popliteal veins. COMPARISON:  Left tib-fib series earlier the same day. FINDINGS: Contralateral Common  Femoral Vein: Respiratory phasicity is normal and symmetric with the symptomatic side. No evidence of thrombus. Normal compressibility. Common Femoral Vein: No evidence of thrombus. Normal compressibility, respiratory phasicity and response to augmentation. Saphenofemoral Junction: No evidence of thrombus. Normal compressibility and flow on color Doppler imaging. Profunda Femoral Vein: No evidence of thrombus. Normal compressibility and flow on color Doppler imaging. Femoral Vein: No evidence of thrombus. Normal compressibility, respiratory phasicity and response to augmentation. Popliteal Vein: No evidence of thrombus. Normal compressibility, respiratory phasicity and response to augmentation. Calf Veins: Limited visualization but no evidence of thrombus. Normal compressibility and flow on color Doppler imaging. Venous Reflux:  None. Other Findings:  None. IMPRESSION: No evidence of left lower extremity deep venous thrombosis. Electronically Signed   By: Genevie Ann M.D.   On: 04/09/2019 00:28     .Critical Care Performed by: Gregor Hams, MD Authorized by: Gregor Hams, MD   Critical care provider statement:    Critical care time (minutes):  30   Critical care time was exclusive of:  Separately billable procedures and treating other patients (Left leg cellulitis)   Critical care was necessary to treat or prevent imminent or life-threatening deterioration of the following conditions:  Sepsis   Critical care was time spent personally by me on the following activities:  Development of treatment plan with patient or surrogate, discussions with consultants, evaluation of patient's response to treatment, examination of patient, obtaining history from patient or surrogate, ordering and performing treatments and interventions, ordering and review of laboratory studies, ordering and review of radiographic studies, pulse oximetry, re-evaluation of patient's condition and review of old  charts     ____________________________________________   [place] / MDM / [place] / ED COURSE  As part of my medical decision making, I reviewed the following data within the electronic MEDICAL RECORD NUMBER   68 year old male presented with above-stated history and physical exam consistent with possible sepsis secondary to left lower extremity cellulitis with central wound.  Sepsis protocol initiated.  Patient given appropriate IV antibiotic therapy.  Patient discussed with Dr. Sidney Ace hospital admission for further evaluation and management.   *Benjamin [name] was evaluated in Emergency Department on 04/09/2019 for the symptoms described in the history of present illness. He was evaluated in the context of the global COVID-19 pandemic, which necessitated consideration that the patient might be at risk for infection with the SARS-CoV-2 virus that causes COVID-19. Institutional protocols and algorithms that pertain to the evaluation of patients at risk for COVID-19 are in a state of rapid change based on information released by regulatory  bodies including the CDC and federal and state organizations. These policies and algorithms were followed during the patient's care in the ED.  Some ED evaluations and interventions may be delayed as a result of limited staffing during the pandemic.*   ____________________________________________  FINAL CLINICAL IMPRESSION(S) / ED DIAGNOSES  Final diagnoses:  Cellulitis and abscess of left lower extremity  Sepsis, due to unspecified organism, unspecified whether acute organ dysfunction present ([place])     MEDICATIONS GIVEN DURING THIS VISIT:  Medications  sodium chloride flush (NS) 0.9 % injection 3 mL (3 mLs Intravenous Refused 04/09/19 0256)  diltiazem (CARDIZEM CD) 24 hr capsule 120 mg (has no administration in time range)  buPROPion (WELLBUTRIN XL) 24 hr tablet 300 mg (has no administration in time range)  FLUoxetine (PROZAC)  capsule 20 mg (has no administration in time range)  traZODone (DESYREL) tablet 50 mg (has no administration in time range)  testosterone cypionate (DEPOTESTOSTERONE CYPIONATE) injection 200 mg (200 mg Intramuscular Refused 04/09/19 0300)  pantoprazole (PROTONIX) EC tablet 40 mg (has no administration in time range)  apixaban (ELIQUIS) tablet 5 mg (5 mg Oral Refused 04/09/19 0301)  rOPINIRole (REQUIP) tablet 4 mg (has no administration in time range)  fluticasone (FLONASE) 50 MCG/ACT nasal spray 2 spray (has no administration in time range)  0.9 %  sodium chloride infusion (has no administration in time range)  acetaminophen (TYLENOL) tablet 650 mg (has no administration in time range)    Or  acetaminophen (TYLENOL) suppository 650 mg (has no administration in time range)  magnesium hydroxide (MILK OF MAGNESIA) suspension 30 mL (has no administration in time range)  ondansetron (ZOFRAN) tablet 4 mg (has no administration in time range)    Or  ondansetron (ZOFRAN) injection 4 mg (has no administration in time range)  piperacillin-tazobactam (ZOSYN) IVPB 3.375 g (has no administration in time range)  oxyCODONE-acetaminophen (PERCOCET/ROXICET) 5-325 MG per tablet 1 tablet (1 tablet Oral Given 04/09/19 0302)    And  oxyCODONE (Oxy IR/ROXICODONE) immediate release tablet 5 mg (has no administration in time range)  cefTRIAXone (ROCEPHIN) 2 g in sodium chloride 0.9 % 100 mL IVPB (0 g Intravenous Stopped 04/09/19 0148)  vancomycin (VANCOCIN) 2,000 mg in sodium chloride 0.9 % 500 mL IVPB (2,000 mg Intravenous New Bag/Given 04/09/19 0148)     ED Discharge Orders    None       [name]:  This document was prepared using Dragon voice recognition software and may include unintentional dictation errors.   Gregor Hams, MD 04/09/19 0400

## 2019-04-09 NOTE — Consult Note (Signed)
Benjamin [name]  MRN : [id number]  Benjamin [name] is a 68 y.o. (Jul 10, 1951) male who presents with chief complaint of  Chief Complaint  Patient presents with  . Leg Injury   History of Present Illness:  The patient is a 68 year old male with a past medical history of atrial fibrillation on Eliquis, depression, anxiety, GERD, COPD who presents to the [facility] emergency department with a chief complaint of "leg injury".  The patient endorses a history of sustaining trauma to the left shin approximately 1 month ago.  The patient describes the trauma as part of a tree falling from his truck and hitting him in the left shin.  The patient notes that the discomfort and swelling he had sustained at the time had progressively improved until approximately 3 days ago.  The patient notes 3 days of worsening erythema and edema to the left shin.  The patient was febrile to 102 with chills last night.  Patient notes "a lot" of drainage from the wound which was healing.  Patient denies any shortness of breath or chest pain.  Patient denies any pallor, paresthesia or coolness to the extremity.  X-ray of the left leg revealed diffuse soft tissue swelling without acute osseous abnormality.   Venous duplex revealed no evidence for DVT.  Patient was admitted to the medicine service and started on IV antibiotics for cellulitis and wound care.  Vascular surgery was consulted by Dr. Sidney Ace for possible compartment syndrome. Current Facility-Administered Medications  Medication Dose Route Frequency Provider Last Rate Last Dose  . 0.9 %  sodium chloride infusion   Intravenous Continuous Mansy, Jan A, MD 100 mL/hr at 04/09/19 0525    . acetaminophen (TYLENOL) tablet 650 mg  650 mg Oral Q6H PRN Mansy, Jan A, MD       Or  . acetaminophen (TYLENOL) suppository 650 mg  650 mg Rectal Q6H PRN Mansy, Jan A, MD      . buPROPion (WELLBUTRIN XL) 24 hr  tablet 300 mg  300 mg Oral Daily Mansy, Jan A, MD   300 mg at 04/09/19 4709  . diltiazem (CARDIZEM CD) 24 hr capsule 120 mg  120 mg Oral Daily Mansy, Jan A, MD      . enoxaparin (LOVENOX) injection 40 mg  40 mg Subcutaneous Q12H Gladstone Lighter, MD      . FLUoxetine (PROZAC) capsule 20 mg  20 mg Oral [facility], Jan A, MD   20 mg at 04/09/19 0716  . fluticasone (FLONASE) 50 MCG/ACT nasal spray 2 spray  2 spray Each Nare Daily Mansy, Jan A, MD      . magnesium hydroxide (MILK OF MAGNESIA) suspension 30 mL  30 mL Oral Daily PRN Mansy, Jan A, MD      . morphine 2 MG/ML injection 2 mg  2 mg Intravenous Q3H PRN Mansy, Jan A, MD      . ondansetron [facility]) tablet 4 mg  4 mg Oral Q6H PRN Mansy, Jan A, MD       Or  . ondansetron [facility]) injection 4 mg  4 mg Intravenous Q6H PRN Mansy, Jan A, MD      . oxyCODONE-acetaminophen (PERCOCET/ROXICET) 5-325 MG per tablet 1 tablet  1 tablet Oral Q6H PRN Mansy, Jan A, MD   1 tablet at 04/09/19 0302   And  . oxyCODONE (Oxy IR/ROXICODONE) immediate release tablet 5 mg  5 mg Oral Q6H PRN Mansy, Arvella Merles, MD      .  pantoprazole (PROTONIX) EC tablet 40 mg  40 mg Oral Daily Mansy, Jan A, MD   40 mg at 04/09/19 6295  . piperacillin-tazobactam (ZOSYN) IVPB 3.375 g  3.375 g Intravenous Q8H Mansy, Jan A, MD 12.5 mL/hr at 04/09/19 0525 3.375 g at 04/09/19 0525  . rOPINIRole (REQUIP) tablet 4 mg  4 mg Oral TID Mansy, Jan A, MD   4 mg at 04/09/19 1014  . sodium chloride flush (NS) 0.9 % injection 3 mL  3 mL Intravenous Once Gregor Hams, MD      . testosterone cypionate (DEPOTESTOSTERONE CYPIONATE) injection 200 mg  200 mg Intramuscular Q14 Days Mansy, Jan A, MD      . traZODone (DESYREL) tablet 50 mg  50 mg Oral QHS PRN Mansy, Jan A, MD   50 mg at 04/09/19 0526  . [START ON 04/10/2019] vancomycin (VANCOCIN) 1,750 mg in sodium chloride 0.9 % 500 mL IVPB  1,750 mg Intravenous Q24H Mansy, Arvella Merles, MD       Current Outpatient Medications  Medication Sig Dispense Refill  .  buPROPion (WELLBUTRIN XL) 150 MG 24 hr tablet Take 300 mg by mouth daily.  4  . FLUoxetine (PROZAC) 20 MG capsule Take 20 mg by mouth every morning.  2  . pantoprazole (PROTONIX) 40 MG tablet Take 40 mg by mouth daily.  5  . ropinirole (REQUIP) 5 MG tablet Take 15 mg by mouth at bedtime.     Marland Kitchen testosterone cypionate (DEPOTESTOSTERONE CYPIONATE) 200 MG/ML injection Inject 200 mg into the muscle every 14 (fourteen) days.  2  . traZODone (DESYREL) 50 MG tablet Take 50 mg by mouth at bedtime as needed.  11  . apixaban (ELIQUIS) 5 MG TABS tablet Take 1 tablet (5 mg total) by mouth 2 (two) times daily. 60 tablet 0  . fluticasone (FLONASE) 50 MCG/ACT nasal spray Place 2 sprays into both nostrils daily.  11  . oxyCODONE-acetaminophen (PERCOCET) 10-325 MG tablet Take 1 tablet by mouth every 6 (six) hours as needed for pain. (Patient not taking: Reported on 04/09/2019) 20 tablet 0   Past Medical History:  Diagnosis Date  . Depression   . GERD (gastroesophageal reflux disease)   . OSA (obstructive sleep apnea)    Past Surgical History:  Procedure Laterality Date  . none     Social History Social History   Tobacco Use  . Smoking status: Never Smoker  . Smokeless tobacco: Never Used  Substance Use Topics  . Alcohol use: Yes  . Drug use: No   Family History Family History  Problem Relation Age of Onset  . CAD Neg Hx   . Diabetes Mellitus II Neg Hx   Patient denies any family history of peripheral artery disease, venous disease and or bleeding/clotting disorders.  Allergies  Allergen Reactions  . Pramipexole Itching   REVIEW OF SYSTEMS (Negative unless checked)  Constitutional: [] Weight loss  [x] Fever  [] Chills Cardiac: [] Chest pain   [] Chest pressure   [] Palpitations   [] Shortness of breath when laying flat   [] Shortness of breath at rest   [] Shortness of breath with exertion. Vascular:  [x] Pain in legs with walking   [x] Pain in legs at rest   [x] Pain in legs when laying flat    [] Claudication   [] Pain in feet when walking  [] Pain in feet at rest  [] Pain in feet when laying flat   [] History of DVT   [] Phlebitis   [] Swelling in legs   [] Varicose veins   [] Non-healing ulcers Pulmonary:   []   Uses home oxygen   [] Productive cough   [] Hemoptysis   [] Wheeze  [] COPD   [] Asthma Neurologic:  [] Dizziness  [] Blackouts   [] Seizures   [] History of stroke   [] History of TIA  [] Aphasia   [] Temporary blindness   [] Dysphagia   [] Weakness or numbness in arms   [] Weakness or numbness in legs Musculoskeletal:  [] Arthritis   [] Joint swelling   [] Joint pain   [] Low back pain Hematologic:  [] Easy bruising  [] Easy bleeding   [] Hypercoagulable state   [] Anemic  [] Hepatitis Gastrointestinal:  [] Blood in stool   [] Vomiting blood  [] Gastroesophageal reflux/heartburn   [] Difficulty swallowing. Genitourinary:  [] Chronic kidney disease   [] Difficult urination  [] Frequent urination  [] Burning with urination   [] Blood in urine Skin:  [] Rashes   [x] Ulcers   [x] Wounds Psychological:  [] History of anxiety   []  History of major depression.  Physical Examination  Vitals:   04/09/19 0735 04/09/19 0740 04/09/19 0800 04/09/19 0830  BP: 118/61  116/62 105/68  Pulse:  88 87 90  Resp: 17 18    Temp:      TempSrc:      SpO2:  93% 93% 92%  Weight:      Height:       Body mass index is 42.57 kg/m. Gen:  WD/WN, NAD Head: [place]/AT, No temporalis wasting. Prominent temp pulse not noted. Ear/Nose/Throat: Hearing grossly intact, nares w/o erythema or drainage, oropharynx w/o Erythema/Exudate Eyes: Sclera non-icteric, conjunctiva clear Neck: Trachea midline.  No JVD.  Pulmonary:  Good air movement, respirations not labored, equal bilaterally.  Cardiac: Irregularly irregular Vascular:  Vessel Right Left  Radial Palpable Palpable  Ulnar Palpable Palpable  Brachial Palpable Palpable  Carotid Palpable, without bruit Palpable, without bruit  Aorta Not palpable N/A  Femoral Palpable Palpable  Popliteal Palpable  Palpable  PT Palpable Non-Palpable  DP Palpable Non-Palpable   Left Lower Extremity: Thigh soft.  Calf soft.  There is a moderate amount of edema noted to the calf area.  Hard to palpate pedal pulses due to body habitus and edema however the foot is warm and there is a good capillary refill to the left toes.  Motor/sensory is intact throughout the left lower extremity.  There is no acute vascular compromise noted to the left lower extremity at this time.  The extremity is cellulitic.  Patient with wound to the left shin.  Gastrointestinal: soft, non-tender/non-distended. No guarding/reflex.  Musculoskeletal: M/S 5/5 throughout.  Extremities without ischemic changes.  Neurologic: Sensation grossly intact in extremities.  Symmetrical.  Speech is fluent. Motor exam as listed above. Psychiatric: Judgment intact, Mood & affect appropriate for pt's clinical situation. Dermatologic: As above Lymph : No Cervical, Axillary, or Inguinal lymphadenopathy.  CBC Lab Results  Component Value Date   WBC 20.5 (H) 04/09/2019   HGB 12.1 (L) 04/09/2019   HCT 40.2 04/09/2019   MCV 80.4 04/09/2019   PLT 336 04/09/2019   BMET    Component Value Date/Time   NA 135 04/09/2019 0500   K 3.9 04/09/2019 0500   CL 109 04/09/2019 0500   CO2 17 (L) 04/09/2019 0500   GLUCOSE 174 (H) 04/09/2019 0500   BUN 15 04/09/2019 0500   CREATININE 1.07 04/09/2019 0500   CALCIUM 7.5 (L) 04/09/2019 0500   GFRNONAA >60 04/09/2019 0500   GFRAA >60 04/09/2019 0500   Estimated Creatinine Clearance: 88.2 mL/min (by C-G formula based on SCr of 1.07 mg/dL).  COAG Lab Results  Component Value Date   INR 1.12 02/04/2016  Radiology Dg Tibia/fibula Left  Result Date: 04/08/2019 CLINICAL DATA:  Swelling EXAM: LEFT TIBIA AND FIBULA - 2 VIEW COMPARISON:  None. FINDINGS: Diffuse soft tissue swelling. No fracture or malalignment. No periostitis. No soft tissue emphysema. 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 [place] adjacent to the fibular  malleolus IMPRESSION: Diffuse soft tissue swelling without acute osseous abnormality Electronically Signed   By: Donavan Foil M.D.   On: 04/08/2019 23:10   US Venous Img Lower Unilateral Left  Result Date: 04/09/2019 CLINICAL DATA:  68 year old male with left leg pain redness and swelling after an injury that occurred several weeks ago. EXAM: LEFT LOWER EXTREMITY VENOUS DOPPLER ULTRASOUND TECHNIQUE: Gray-scale sonography with graded compression, as well as color Doppler and duplex ultrasound were performed to evaluate the lower extremity deep venous systems from the level of the common femoral vein and including the common femoral, femoral, profunda femoral, popliteal and calf veins including the posterior tibial, peroneal and gastrocnemius veins when visible. The superficial great saphenous vein was also interrogated. Spectral Doppler was utilized to evaluate flow at rest and with distal augmentation maneuvers in the common femoral, femoral and popliteal veins. COMPARISON:  Left tib-fib series earlier the same day. FINDINGS: Contralateral Common Femoral Vein: Respiratory phasicity is normal and symmetric with the symptomatic side. No evidence of thrombus. Normal compressibility. Common Femoral Vein: No evidence of thrombus. Normal compressibility, respiratory phasicity and response to augmentation. Saphenofemoral Junction: No evidence of thrombus. Normal compressibility and flow on color Doppler imaging. Profunda Femoral Vein: No evidence of thrombus. Normal compressibility and flow on color Doppler imaging. Femoral Vein: No evidence of thrombus. Normal compressibility, respiratory phasicity and response to augmentation. Popliteal Vein: No evidence of thrombus. Normal compressibility, respiratory phasicity and response to augmentation. Calf Veins: Limited visualization but no evidence of thrombus. Normal compressibility and flow on color Doppler imaging. Venous Reflux:  None. Other Findings:  None. IMPRESSION:  No evidence of left lower extremity deep venous thrombosis. Electronically Signed   By: Genevie Ann M.D.   On: 04/09/2019 00:28   Assessment/Plan The patient is a 68 year old male with a past medical history of atrial fibrillation on Eliquis, depression, anxiety, GERD, COPD who presents to the [facility] emergency department with a chief complaint of "leg injury".  Admitted to the medicine service with sepsis. 1.  Left lower extremity compartment syndrome: Low threshold for compartment syndrome to the left lower extremity at this time.  Hard to palpate pedal pulses due to body habitus and edema however patient denies / physical exam is negative for pallor, paresthesia or paralysis. Patient's trauma was approximately one month ago.  Possibly with an infected hematoma / fluid collection and now with subsequent cellulitis/sepsis.  The exam is not concerning for compartment syndrome at this time however we will continue to follow. 2. Cellulitis: Agree with admission and initiation of IV antibiotics.  Encouraged elevation heart level or higher with the patient. 3.  Wound: Agree with wound culture and sensitivity.  Patient will need wound care provided by either wound care nurse or possibly general surgery.  Discussed with Dr. Mayme Genta, PA-C  04/09/2019 11:18 AM  This [name] was created with Dragon medical transcription system.  Any error is purely unintentional.

## 2019-04-09 NOTE — Plan of Care (Signed)
  Problem: Education: Goal: Knowledge of General Education information will improve Description Including pain rating scale, medication(s)/side effects and non-pharmacologic comfort measures Outcome: Progressing   Problem: Health Behavior/Discharge Planning: Goal: Ability to manage health-related needs will improve Outcome: Progressing   Problem: Clinical Measurements: Goal: Cardiovascular complication will be avoided Outcome: Progressing   Problem: Nutrition: Goal: Adequate nutrition will be maintained Outcome: Progressing   Problem: Elimination: Goal: Will not experience complications related to bowel motility Outcome: Progressing Goal: Will not experience complications related to urinary retention Outcome: Progressing   Problem: Pain Managment: Goal: General experience of comfort will improve Outcome: Progressing   Problem: Safety: Goal: Ability to remain free from injury will improve Outcome: Progressing   Problem: Skin Integrity: Goal: Risk for impaired skin integrity will decrease Outcome: Progressing

## 2019-04-09 NOTE — Consult Note (Signed)
PHARMACIST - PHYSICIAN COMMUNICATION  CONCERNING:  Enoxaparin (Lovenox) for DVT Prophylaxis    RECOMMENDATION: Patient was prescribed enoxaprin 40mg  q24 hours for VTE prophylaxis.   Filed Weights   04/08/19 2055  Weight: 280 lb (127 kg)    Body mass index is 42.57 kg/m.  Estimated Creatinine Clearance: 88.2 mL/min (by C-G formula based on SCr of 1.07 mg/dL).   Based on [place] patient is candidate for enoxaparin 40mg  every 12 hour dosing due to BMI being >40.  Patient is candidate for enoxaparin 30mg  every 24 hours based on CrCl <81ml/min or Weight less then 45kg for male and 50kg for male  DESCRIPTION: Pharmacy has adjusted enoxaparin dose per [facility] policy.  Patient is now receiving enoxaparin 40mg  every 12 hours.     Pearla Dubonnet, PharmD Clinical Pharmacist  04/09/2019 10:18 AM

## 2019-04-09 NOTE — ED Notes (Signed)
ED TO INPATIENT HANDOFF REPORT  ED Nurse Name and Phone #: Zuhayr Deeney [phone]  S Name/Age/Gender Benjamin [name] 68 y.o. male Room/Bed: ED19A/ED19A  Code Status   Code Status: Full Code  Home/SNF/Other home Patient oriented x 4 Is this baseline? yes  Triage Complete: Triage complete  Chief Complaint Leg Injury   Triage Note Patient states he hurt his left leg 3-4 weeks ago. Denies seeing any open wound at the time. States he kept area clean with soap and water. Patient states yesterday, he noticed a small wound in middle of left shin with serosanguinous fluid. Patient states he also had fever of 102 at home. Patient denies cough or congestion. Patient states he also has urinary urgency that has worsened today. Reports dizziness that started today.    Allergies Allergies  Allergen Reactions  . Pramipexole Itching    Level of Care/Admitting Diagnosis ED Disposition    ED Disposition Condition [facility] Area: [place] [[id number]]  Level of Care: Med-Surg [16]  Covid Evaluation: N/A  Diagnosis: Lower extremity cellulitis [[id number]]  Admitting Physician: Christel Mormon [[id number]]  Attending Physician: Christel Mormon [[id number]]  Estimated length of stay: past midnight tomorrow  Certification:: I certify this patient will need inpatient services for at least 2 midnights  PT Class (Do Not Modify): Inpatient [101]  PT Acc Code (Do Not Modify): Private [1]       B Medical/Surgery History Past Medical History:  Diagnosis Date  . Depression   . GERD (gastroesophageal reflux disease)   . OSA (obstructive sleep apnea)    Past Surgical History:  Procedure Laterality Date  . none       A IV Location/Drains/Wounds Patient Lines/Drains/Airways Status   Active Line/Drains/Airways    Name:   Placement date:   Placement time:   Site:   Days:   Peripheral IV 04/09/19 Left Antecubital   04/09/19    0034    Antecubital   less than 1   Peripheral IV  04/09/19 Right Hand   04/09/19    0045    Hand   less than 1          Intake/Output Last 24 hours  Intake/Output Summary (Last 24 hours) at 04/09/2019 1218 Last data filed at 04/09/2019 1218 Gross per 24 hour  Intake 2021.52 ml  Output -  Net 2021.52 ml    Labs/Imaging Results for orders placed or performed during the hospital encounter of 04/08/19 (from the past 48 hour(s))  Lactic acid, plasma     Status: None   Collection Time: 04/08/19  9:01 PM  Result Value Ref Range   Lactic Acid, Venous 1.9 0.5 - 1.9 mmol/L    Comment: Performed at [facility], [address] [place], [place] [address]  Comprehensive metabolic panel     Status: Abnormal   Collection Time: 04/08/19  9:02 PM  Result Value Ref Range   Sodium 136 135 - 145 mmol/L   Potassium 4.1 3.5 - 5.1 mmol/L   Chloride 104 98 - 111 mmol/L   CO2 23 22 - 32 mmol/L   Glucose, Bld 124 (H) 70 - 99 mg/dL   BUN 18 8 - 23 mg/dL   Creatinine, Ser 1.21 0.61 - 1.24 mg/dL   Calcium 8.8 (L) 8.9 - 10.3 mg/dL   Total Protein 6.9 6.5 - 8.1 g/dL   Albumin 3.9 3.5 - 5.0 g/dL   AST 21 15 - 41 U/L   ALT  21 0 - 44 U/L   Alkaline Phosphatase 68 38 - 126 U/L   Total Bilirubin 0.7 0.3 - 1.2 mg/dL   GFR calc non Af Amer >60 >60 mL/min   GFR calc Af Amer >60 >60 mL/min   Anion gap 9 5 - 15    Comment: Performed at [facility], [address] [place], [place] [address]  CBC with Differential     Status: Abnormal   Collection Time: 04/08/19  9:02 PM  Result Value Ref Range   WBC 18.2 (H) 4.0 - 10.5 K/uL   RBC 5.11 4.22 - 5.81 MIL/uL   Hemoglobin 12.7 (L) 13.0 - 17.0 g/dL   HCT 40.4 39.0 - 52.0 %   MCV 79.1 (L) 80.0 - 100.0 fL   MCH 24.9 (L) 26.0 - 34.0 pg   MCHC 31.4 30.0 - 36.0 g/dL   RDW 16.9 (H) 11.5 - 15.5 %   Platelets 337 150 - 400 K/uL   nRBC 0.0 0.0 - 0.2 %   Neutrophils Relative % 85 %   Neutro Abs 15.4 (H) 1.7 - 7.7 K/uL   Lymphocytes Relative 6 %   Lymphs Abs 1.2 0.7 - 4.0 K/uL   Monocytes  Relative 8 %   Monocytes Absolute 1.4 (H) 0.1 - 1.0 K/uL   Eosinophils Relative 0 %   Eosinophils Absolute 0.1 0.0 - 0.5 K/uL   Basophils Relative 0 %   Basophils Absolute 0.1 0.0 - 0.1 K/uL   Immature Granulocytes 1 %   Abs Immature Granulocytes 0.09 (H) 0.00 - 0.07 K/uL    Comment: Performed at [facility], [address] [place], [place] [address]  Urinalysis, Complete w Microscopic     Status: Abnormal   Collection Time: 04/08/19  9:02 PM  Result Value Ref Range   Color, Urine YELLOW (A) YELLOW   APPearance CLEAR (A) CLEAR   Specific Gravity, Urine 1.016 1.005 - 1.030   pH 7.0 5.0 - 8.0   Glucose, UA NEGATIVE NEGATIVE mg/dL   Hgb urine dipstick NEGATIVE NEGATIVE   Bilirubin Urine NEGATIVE NEGATIVE   Ketones, ur NEGATIVE NEGATIVE mg/dL   Protein, ur NEGATIVE NEGATIVE mg/dL   Nitrite NEGATIVE NEGATIVE   Leukocytes,Ua SMALL (A) NEGATIVE   RBC / HPF 0-5 0 - 5 RBC/hpf   WBC, UA 0-5 0 - 5 WBC/hpf   Bacteria, UA NONE SEEN NONE SEEN   Squamous Epithelial / LPF NONE SEEN 0 - 5   Mucus PRESENT     Comment: Performed at [facility], [address] [place], [place] [address]  Blood Culture (routine x 2)     Status: None (Preliminary result)   Collection Time: 04/09/19 12:40 AM  Result Value Ref Range   Specimen Description BLOOD RIGHT ANTECUBITAL    Special Requests      BOTTLES DRAWN AEROBIC AND ANAEROBIC Blood Culture results may not be optimal due to an excessive volume of blood received in culture bottles   Culture      NO GROWTH < 12 HOURS Performed at [facility], [address] [place], [place] [address]    Report Status PENDING   SARS Coronavirus 2 (CEPHEID - Performed in [place] hospital lab), Hosp Order     Status: None   Collection Time: 04/09/19 12:41 AM  Result Value Ref Range   SARS Coronavirus 2 NEGATIVE NEGATIVE    Comment: (NOTE) If result is NEGATIVE SARS-CoV-2 target nucleic acids are NOT DETECTED. The SARS-CoV-2 RNA is  generally detectable in upper and lower  respiratory specimens during the acute phase of infection. The lowest  concentration of SARS-CoV-2 viral copies this assay can detect is 250  copies / mL. A negative result does not preclude SARS-CoV-2 infection  and should not be used as the sole basis for treatment or other  patient management decisions.  A negative result may occur with  improper specimen collection / handling, submission of specimen other  than nasopharyngeal swab, presence of viral mutation(s) within the  areas targeted by this assay, and inadequate number of viral copies  (<250 copies / mL). A negative result must be combined with clinical  observations, patient history, and epidemiological information. If result is POSITIVE SARS-CoV-2 target nucleic acids are DETECTED. The SARS-CoV-2 RNA is generally detectable in upper and lower  respiratory specimens dur ing the acute phase of infection.  Positive  results are indicative of active infection with SARS-CoV-2.  Clinical  correlation with patient history and other diagnostic information is  necessary to determine patient infection status.  Positive results do  not rule out bacterial infection or co-infection with other viruses. If result is PRESUMPTIVE POSTIVE SARS-CoV-2 nucleic acids MAY BE PRESENT.   A presumptive positive result was obtained on the submitted specimen  and confirmed on repeat testing.  While 2019 novel coronavirus  (SARS-CoV-2) nucleic acids may be present in the submitted sample  additional confirmatory testing may be necessary for epidemiological  and / or clinical management purposes  to differentiate between  SARS-CoV-2 and other Sarbecovirus currently known to infect humans.  If clinically indicated additional testing with an alternate test  methodology [phone]) is advised. The SARS-CoV-2 RNA is generally  detectable in upper and lower respiratory sp ecimens during the acute  phase of  infection. The expected result is Negative. Fact Sheet for Patients:  StrictlyIdeas.no Fact Sheet for Healthcare Providers: BankingDealers.co.za This test is not yet approved or cleared by the [place] FDA and has been authorized for detection and/or diagnosis of SARS-CoV-2 by FDA under an Emergency Use Authorization (EUA).  This EUA will remain in effect (meaning this test can be used) for the duration of the COVID-19 declaration under Section 564(b)(1) of the Act, 21 U.S.C. section 360bbb-3(b)(1), unless the authorization is terminated or revoked sooner. Performed at [facility], [address] [place], [place] [address]   Blood Culture (routine x 2)     Status: None (Preliminary result)   Collection Time: 04/09/19 12:45 AM  Result Value Ref Range   Specimen Description BLOOD BLOOD RIGHT FOREARM    Special Requests      BOTTLES DRAWN AEROBIC AND ANAEROBIC Blood Culture adequate volume   Culture      NO GROWTH < 12 HOURS Performed at [facility], [address] [place], [place] [address]    Report Status PENDING   Basic metabolic panel     Status: Abnormal   Collection Time: 04/09/19  5:00 AM  Result Value Ref Range   Sodium 135 135 - 145 mmol/L   Potassium 3.9 3.5 - 5.1 mmol/L   Chloride 109 98 - 111 mmol/L   CO2 17 (L) 22 - 32 mmol/L   Glucose, Bld 174 (H) 70 - 99 mg/dL   BUN 15 8 - 23 mg/dL   Creatinine, Ser 1.07 0.61 - 1.24 mg/dL   Calcium 7.5 (L) 8.9 - 10.3 mg/dL   GFR calc non Af Amer >60 >60 mL/min   GFR calc Af Amer >60 >60 mL/min  Anion gap 9 5 - 15    Comment: Performed at [facility], [address] [place], [place] [address]  CBC     Status: Abnormal   Collection Time: 04/09/19  5:00 AM  Result Value Ref Range   WBC 20.5 (H) 4.0 - 10.5 K/uL   RBC 5.00 4.22 - 5.81 MIL/uL   Hemoglobin 12.1 (L) 13.0 - 17.0 g/dL   HCT 40.2 39.0 - 52.0 %   MCV 80.4 80.0 - 100.0 fL   MCH  24.2 (L) 26.0 - 34.0 pg   MCHC 30.1 30.0 - 36.0 g/dL   RDW 17.0 (H) 11.5 - 15.5 %   Platelets 336 150 - 400 K/uL   nRBC 0.0 0.0 - 0.2 %    Comment: Performed at [facility], [address] [place], [place] [address]  Lactic acid, plasma     Status: Abnormal   Collection Time: 04/09/19  5:29 AM  Result Value Ref Range   Lactic Acid, Venous 2.2 (HH) 0.5 - 1.9 mmol/L    Comment: CRITICAL RESULT CALLED TO, READ BACK BY AND VERIFIED WITH ANNIE SMITH ON 04/09/2019 AT 8841 QSD Performed at [facility], [address] [place], [place] [address]   Aerobic Culture (superficial specimen)     Status: None (Preliminary result)   Collection Time: 04/09/19  7:23 AM  Result Value Ref Range   Specimen Description      WOUND LEG Performed at [facility], [place] [address] [place], [place] [address]    Special Requests      Normal Performed at [facility], [place], [place] [address]    Gram Stain      MODERATE WBC PRESENT, PREDOMINANTLY PMN MODERATE GRAM POSITIVE COCCI IN PAIRS IN CLUSTERS RARE GRAM NEGATIVE RODS Performed at [facility], [place] [address] [place], [place] [address]    Culture PENDING    Report Status PENDING    Dg Tibia/fibula Left  Result Date: 04/08/2019 CLINICAL DATA:  Swelling EXAM: LEFT TIBIA AND FIBULA - 2 VIEW COMPARISON:  None. FINDINGS: Diffuse soft tissue swelling. No fracture or malalignment. No periostitis. No soft tissue emphysema. 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 Ossicle adjacent to the fibular malleolus IMPRESSION: Diffuse soft tissue swelling without acute osseous abnormality Electronically Signed   By: Donavan Foil M.D.   On: 04/08/2019 23:10   US Venous Img Lower Unilateral Left  Result Date: 04/09/2019 CLINICAL DATA:  68 year old male with left leg pain redness and swelling after an injury that occurred several weeks ago. EXAM: LEFT LOWER EXTREMITY VENOUS DOPPLER ULTRASOUND TECHNIQUE: Gray-scale sonography  with graded compression, as well as color Doppler and duplex ultrasound were performed to evaluate the lower extremity deep venous systems from the level of the common femoral vein and including the common femoral, femoral, profunda femoral, popliteal and calf veins including the posterior tibial, peroneal and gastrocnemius veins when visible. The superficial great saphenous vein was also interrogated. Spectral Doppler was utilized to evaluate flow at rest and with distal augmentation maneuvers in the common femoral, femoral and popliteal veins. COMPARISON:  Left tib-fib series earlier the same day. FINDINGS: Contralateral Common Femoral Vein: Respiratory phasicity is normal and symmetric with the symptomatic side. No evidence of thrombus. Normal compressibility. Common Femoral Vein: No evidence of thrombus. Normal compressibility, respiratory phasicity and response to augmentation. Saphenofemoral Junction: No evidence of thrombus. Normal compressibility and flow on color Doppler imaging. Profunda Femoral Vein: No evidence of thrombus. Normal  compressibility and flow on color Doppler imaging. Femoral Vein: No evidence of thrombus. Normal compressibility, respiratory phasicity and response to augmentation. Popliteal Vein: No evidence of thrombus. Normal compressibility, respiratory phasicity and response to augmentation. Calf Veins: Limited visualization but no evidence of thrombus. Normal compressibility and flow on color Doppler imaging. Venous Reflux:  None. Other Findings:  None. IMPRESSION: No evidence of left lower extremity deep venous thrombosis. Electronically Signed   By: Genevie Ann M.D.   On: 04/09/2019 00:28    Pending Labs Unresulted Labs (From admission, onward)    Start     Ordered   04/09/19 0529  Lactic acid, plasma  STAT Now then every 3 hours,   STAT     04/09/19 0528   04/09/19 0040  Urine culture  ONCE - STAT,   STAT     04/09/19 0040   04/08/19 2350  Lactic acid, plasma  Once,   R      04/08/19 2350          Vitals/Pain Today's Vitals   04/09/19 0740 04/09/19 0800 04/09/19 0830 04/09/19 0846  BP:  116/62 105/68   Pulse: 88 87 90   Resp: 18     Temp:      TempSrc:      SpO2: 93% 93% 92%   Weight:      Height:      PainSc:    4     Isolation Precautions No active isolations  Medications Medications  sodium chloride flush (NS) 0.9 % injection 3 mL (3 mLs Intravenous Refused 04/09/19 0256)  diltiazem (CARDIZEM CD) 24 hr capsule 120 mg (has no administration in time range)  buPROPion (WELLBUTRIN XL) 24 hr tablet 300 mg (300 mg Oral Given 04/09/19 0923)  FLUoxetine (PROZAC) capsule 20 mg (20 mg Oral Given 04/09/19 0716)  traZODone (DESYREL) tablet 50 mg (50 mg Oral Given 04/09/19 0526)  testosterone cypionate (DEPOTESTOSTERONE CYPIONATE) injection 200 mg (200 mg Intramuscular Refused 04/09/19 0300)  pantoprazole (PROTONIX) EC tablet 40 mg (40 mg Oral Given 04/09/19 0923)  rOPINIRole (REQUIP) tablet 4 mg (4 mg Oral Given 04/09/19 1014)  fluticasone (FLONASE) 50 MCG/ACT nasal spray 2 spray (has no administration in time range)  0.9 %  sodium chloride infusion ( Intravenous Rate/Dose Verify 04/09/19 1218)  acetaminophen (TYLENOL) tablet 650 mg (has no administration in time range)    Or  acetaminophen (TYLENOL) suppository 650 mg (has no administration in time range)  magnesium hydroxide (MILK OF MAGNESIA) suspension 30 mL (has no administration in time range)  ondansetron (ZOFRAN) tablet 4 mg (has no administration in time range)    Or  ondansetron (ZOFRAN) injection 4 mg (has no administration in time range)  piperacillin-tazobactam (ZOSYN) IVPB 3.375 g ( Intravenous Stopped 04/09/19 0845)  oxyCODONE-acetaminophen (PERCOCET/ROXICET) 5-325 MG per tablet 1 tablet (1 tablet Oral Given 04/09/19 0302)    And  oxyCODONE (Oxy IR/ROXICODONE) immediate release tablet 5 mg (has no administration in time range)  morphine 2 MG/ML injection 2 mg (has no administration in time  range)  vancomycin (VANCOCIN) 1,750 mg in sodium chloride 0.9 % 500 mL IVPB (has no administration in time range)  enoxaparin (LOVENOX) injection 40 mg (has no administration in time range)  cefTRIAXone (ROCEPHIN) 2 g in sodium chloride 0.9 % 100 mL IVPB (0 g Intravenous Stopped 04/09/19 0148)  vancomycin (VANCOCIN) 2,000 mg in sodium chloride 0.9 % 500 mL IVPB (0 mg Intravenous Stopped 04/09/19 0506)    Mobility Low fall risk  Focused Assessments    R Recommendations: See Admitting Provider Note

## 2019-04-10 DIAGNOSIS — L02416 Cutaneous abscess of left lower limb: Secondary | ICD-10-CM

## 2019-04-10 LAB — URINE CULTURE: Culture: NO GROWTH

## 2019-04-10 MED ORDER — ALBUMIN HUMAN 25 % IV SOLN
25.0000 g | Freq: Once | INTRAVENOUS | Status: AC
  Administered 2019-04-10: 16:00:00 25 g via INTRAVENOUS
  Filled 2019-04-10: qty 100

## 2019-04-10 MED ORDER — SODIUM CHLORIDE 0.9 % IV SOLN
INTRAVENOUS | Status: DC | PRN
  Administered 2019-04-10 – 2019-04-14 (×5): 250 mL via INTRAVENOUS
  Administered 2019-04-14: 14:00:00 5 mL via INTRAVENOUS

## 2019-04-10 MED ORDER — SILVER SULFADIAZINE 1 % EX CREA
TOPICAL_CREAM | Freq: Two times a day (BID) | CUTANEOUS | Status: DC
  Administered 2019-04-10 – 2019-04-14 (×8): via TOPICAL
  Filled 2019-04-10 (×2): qty 85

## 2019-04-10 NOTE — Progress Notes (Signed)
[facility] NAME: Benjamin [name]    MR#:  [id number]  DATE OF BIRTH:  [birth year]  SUBJECTIVE:  CHIEF COMPLAINT:   Chief Complaint  Patient presents with   Leg Injury   -Patient had a trauma to his left shin a month ago, has been having worsening redness and nonhealing wound and so presents to the emergency room. Says his swelling and redness is still the same.  Not much pain unless he touches his leg or the wound.  REVIEW OF SYSTEMS:  Review of Systems  Constitutional: Negative for chills, fever and malaise/fatigue.  HENT: Negative for congestion, ear discharge, hearing loss and nosebleeds.   Eyes: Negative for blurred vision and double vision.  Respiratory: Negative for cough, shortness of breath and wheezing.   Cardiovascular: Positive for leg swelling. Negative for chest pain and palpitations.  Gastrointestinal: Negative for abdominal pain, constipation, diarrhea, nausea and vomiting.  Genitourinary: Negative for dysuria.  Neurological: Negative for dizziness, seizures and headaches.  Psychiatric/Behavioral: Negative for depression.    DRUG ALLERGIES:   Allergies  Allergen Reactions   Pramipexole Itching    VITALS:  Blood pressure 122/76, pulse 87, temperature 98.3 F (36.8 C), temperature source Oral, resp. rate 18, height 5\' 8"  (1.727 m), weight 127 kg, SpO2 97 %.  PHYSICAL EXAMINATION:  Physical Exam   GENERAL:  68 y.o.-year-old obese patient lying in the bed with no acute distress.  EYES: Pupils equal, round, reactive to light and accommodation. No scleral icterus. Extraocular muscles intact.  HEENT: Head atraumatic, normocephalic. Oropharynx and nasopharynx clear.  NECK:  Supple, no jugular venous distention. No thyroid enlargement, no tenderness.  LUNGS: Normal breath sounds bilaterally, no wheezing, rales,rhonchi or crepitation. No use of accessory muscles of respiration.  Decreased bibasilar breath  sounds CARDIOVASCULAR: S1, S2 normal. No murmurs, rubs, or gallops.  ABDOMEN: Soft, nontender, nondistended. Bowel sounds present. No organomegaly or mass.  EXTREMITIES: Left foot 2+ edema, swollen left leg with erythema and open wounds with purulent discharge.  No  cyanosis, or clubbing.  NEUROLOGIC: Cranial nerves II through XII are intact. Muscle strength 5/5 in all extremities. Sensation intact. Gait not checked.  PSYCHIATRIC: The patient is alert and oriented x 3.  SKIN: No obvious rash, lesion, or ulcer.          LABORATORY PANEL:   CBC Recent Labs  Lab 04/09/19 0500  WBC 20.5*  HGB 12.1*  HCT 40.2  PLT 336   ------------------------------------------------------------------------------------------------------------------  Chemistries  Recent Labs  Lab 04/08/19 2102 04/09/19 0500  NA 136 135  K 4.1 3.9  CL 104 109  CO2 23 17*  GLUCOSE 124* 174*  BUN 18 15  CREATININE 1.21 1.07  CALCIUM 8.8* 7.5*  AST 21  --   ALT 21  --   ALKPHOS 68  --   BILITOT 0.7  --    ------------------------------------------------------------------------------------------------------------------  Cardiac Enzymes No results for input(s): TROPONINI in the last 168 hours. ------------------------------------------------------------------------------------------------------------------  RADIOLOGY:  Dg Tibia/fibula Left  Result Date: 04/08/2019 CLINICAL DATA:  Swelling EXAM: LEFT TIBIA AND FIBULA - 2 VIEW COMPARISON:  None. FINDINGS: Diffuse soft tissue swelling. No fracture or malalignment. No periostitis. No soft tissue emphysema. 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 Ossicle adjacent to the fibular malleolus IMPRESSION: Diffuse soft tissue swelling without acute osseous abnormality Electronically Signed   By: Donavan Foil M.D.   On: 04/08/2019 23:10   Ct Tibia Fibula Left W Contrast  Result Date: 04/09/2019 CLINICAL  DATA:  Left leg injury doing yard work 3-4 weeks ago. Increasing pain,  erythema and swelling over the last day. Concert for underlying infection. EXAM: CT OF THE LOWER LEFT EXTREMITY WITH CONTRAST TECHNIQUE: Multidetector CT imaging of the left lower leg was performed according to the standard protocol following intravenous contrast administration. COMPARISON:  Doppler ultrasound 04/08/2019. Left lower leg radiographs 04/08/2019. CONTRAST:  120mL OMNIPAQUE IOHEXOL 300 MG/ML  SOLN FINDINGS: Bones/Joint/Cartilage No evidence of acute fracture or dislocation. There is no bone destruction. 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 Mild degenerative changes are present at the left ankle. There is a small knee joint effusion with mild synovial enhancement. Ligaments Suboptimally assessed by CT. Muscles and Tendons No intramuscular fluid collection or foreign body demonstrated. The visualized ankle tendons are intact. Soft tissues There is apparent skin ulceration anteriorly in the mid lower leg with associated dermal thickening and ill-defined fluid in the underlying subcutaneous fat. There is no well-defined soft tissue abscess, foreign body or soft tissue emphysema. There is no adjacent periosteal thickening or cortical destruction. IMPRESSION: 1. Soft tissue ulceration anteriorly in the mid lower leg with ill-defined underlying fluid in the subcutaneous fat. No well-defined abscess. 2. No evidence of osteomyelitis. 3. 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 Electronically Signed   By: Richardean Sale M.D.   On: 04/09/2019 16:58   US Venous Img Lower Unilateral Left  Result Date: 04/09/2019 CLINICAL DATA:  69 year old male with left leg pain redness and swelling after an injury that occurred several weeks ago. EXAM: LEFT LOWER EXTREMITY VENOUS DOPPLER ULTRASOUND TECHNIQUE: Gray-scale sonography with graded compression, as well as color Doppler and duplex ultrasound were performed to evaluate the  lower extremity deep venous systems from the level of the common femoral vein and including the common femoral, femoral, profunda femoral, popliteal and calf veins including the posterior tibial, peroneal and gastrocnemius veins when visible. The superficial great saphenous vein was also interrogated. Spectral Doppler was utilized to evaluate flow at rest and with distal augmentation maneuvers in the common femoral, femoral and popliteal veins. COMPARISON:  Left tib-fib series earlier the same day. FINDINGS: Contralateral Common Femoral Vein: Respiratory phasicity is normal and symmetric with the symptomatic side. No evidence of thrombus. Normal compressibility. Common Femoral Vein: No evidence of thrombus. Normal compressibility, respiratory phasicity and response to augmentation. Saphenofemoral Junction: No evidence of thrombus. Normal compressibility and flow on color Doppler imaging. Profunda Femoral Vein: No evidence of thrombus. Normal compressibility and flow on color Doppler imaging. Femoral Vein: No evidence of thrombus. Normal compressibility, respiratory phasicity and response to augmentation. Popliteal Vein: No evidence of thrombus. Normal compressibility, respiratory phasicity and response to augmentation. Calf Veins: Limited visualization but no evidence of thrombus. Normal compressibility and flow on color Doppler imaging. Venous Reflux:  None. Other Findings:  None. IMPRESSION: No evidence of left lower extremity deep venous thrombosis. Electronically Signed   By: Genevie Ann M.D.   On: 04/09/2019 00:28    EKG:   Orders placed or performed during the hospital encounter of 04/08/19   ED EKG 12-Lead   ED EKG 12-Lead   EKG 12-Lead   EKG 12-Lead    ASSESSMENT AND PLAN:   68 year old male with past medical history significant for depression, GERD, sleep apnea presents to hospital secondary to worsening left leg swelling, erythema and draining wound for almost 3-4 weeks now.  1.   Sepsis-secondary to left lower extremity cellulitis  with draining wounds-started after a trauma. -Elevated white count, elevated lactic acid, fever noted -Swollen and erythematous.  Continue IV antibiotics with vancomycin and Zosyn -Appreciated infectious disease consult.  She has recommended CT to rule out underlying abscess-no deep involvement but soft tissue collections and swelling. -Surgery has been consulted as well.  Plan is to watch until tomorrow and decide on procedure by tomorrow. -Keep the lower extremity elevated for now -Blood cultures and topical wound cultures have been taken.  2.  Paroxysmal atrial fibrillation-happened several years ago due to lack of sleep.  Only 2 episodes.  Transiently was placed on Cardizem and Eliquis.  Patient has stopped taking them for a long time and do not wish to be on them.  3.  GERD-on Protonix  4.  Depression-on Prozac and Wellbutrin  5.  DVT prophylaxis-Lovenox  Independent at baseline   All the records are reviewed and case discussed with Care Management/Social Workerr. Management plans discussed with the patient, family and they are in agreement.  CODE STATUS: Full Code  TOTAL TIME TAKING CARE OF THIS PATIENT: 39 minutes.   POSSIBLE D/C IN 2 DAYS, DEPENDING ON CLINICAL CONDITION.   Vaughan Basta M.D on 04/10/2019 at 1:52 PM  Between 7am to 6pm - Pager - [phone]  After 6pm go to [email or web address] - password [email or web address] [facility]  Office  [phone]  CC: Primary care physician; Sharyne Peach, MD

## 2019-04-10 NOTE — Consult Note (Signed)
Patient ID: Benjamin [name], male   DOB: 07/14/1951, 68 y.o.   MRN: [id number]  HPI Benjamin [name] is a 67 y.o. male asked to see in consultation by Dr. Tressia Miners for left lower extremity cellulitis.  Currently 4 weeks ago he was in a tree in his yard and a branch fell on his leg.  He had superficial wound that actually healed well over the last couple weeks.  Last 3 days ago or so he had increase in swelling on his leg and within a day things started to get worse with significant pain and draining fluid from the left leg.  The pain was moderate in intensity and he did have a fever as well as erythema on that side. Early his dog lick his wound and as far as he knows he also was showering with well water in his house.  Count is 20,000 with a hemoglobin of 12.  Repeat is normal except bicarb that is 17 and glucose of 174. Did have a CT of his leg that I have personally review showing evidence of soft tissue edema and ulceration that is superficial.  No evidence of gas or evidence to suggest necrotizing soft tissue infection. Has been started on vancomycin and Zosyn with no significant change in his cellulitis.  He has been afebrile for the last 24 hours and now his tachycardia has resolved. Cultures so far have been negative  HPI  Past Medical History:  Diagnosis Date  . Depression   . GERD (gastroesophageal reflux disease)   . OSA (obstructive sleep apnea)     Past Surgical History:  Procedure Laterality Date  . none      Family History  Problem Relation Age of Onset  . CAD Neg Hx   . Diabetes Mellitus II Neg Hx     Social History Social History   Tobacco Use  . Smoking status: Never Smoker  . Smokeless tobacco: Never Used  Substance Use Topics  . Alcohol use: Yes  . Drug use: No    Allergies  Allergen Reactions  . Pramipexole Itching    Current Facility-Administered Medications  Medication Dose Route Frequency Provider Last Rate Last Dose  . 0.9 %  sodium chloride  infusion   Intravenous Continuous Mansy, Jan A, MD 100 mL/hr at 04/10/19 0327    . 0.9 %  sodium chloride infusion   Intravenous PRN Vaughan Basta, MD   Stopped at 04/10/19 [phone]  . acetaminophen (TYLENOL) tablet 650 mg  650 mg Oral Q6H PRN Mansy, Jan A, MD       Or  . acetaminophen (TYLENOL) suppository 650 mg  650 mg Rectal Q6H PRN Mansy, Jan A, MD      . buPROPion (WELLBUTRIN XL) 24 hr tablet 300 mg  300 mg Oral Daily Mansy, Jan A, MD   300 mg at 04/09/19 4166  . enoxaparin (LOVENOX) injection 40 mg  40 mg Subcutaneous Q12H Gladstone Lighter, MD   40 mg at 04/09/19 2114  . FLUoxetine (PROZAC) capsule 20 mg  20 mg Oral BH-q7a Mansy, Jan A, MD   20 mg at 04/09/19 0716  . fluticasone (FLONASE) 50 MCG/ACT nasal spray 2 spray  2 spray Each Nare Daily PRN Mansy, Jan A, MD      . magnesium hydroxide (MILK OF MAGNESIA) suspension 30 mL  30 mL Oral Daily PRN Mansy, Jan A, MD      . morphine 2 MG/ML injection 2 mg  2 mg Intravenous Q3H PRN Mansy, Jan  A, MD      . ondansetron (ZOFRAN) tablet 4 mg  4 mg Oral Q6H PRN Mansy, Jan A, MD       Or  . ondansetron [facility]) injection 4 mg  4 mg Intravenous Q6H PRN Mansy, Jan A, MD      . oxyCODONE-acetaminophen (PERCOCET/ROXICET) 5-325 MG per tablet 1 tablet  1 tablet Oral Q6H PRN Mansy, Jan A, MD   1 tablet at 04/09/19 0302   And  . oxyCODONE (Oxy IR/ROXICODONE) immediate release tablet 5 mg  5 mg Oral Q6H PRN Mansy, Jan A, MD      . pantoprazole (PROTONIX) EC tablet 40 mg  40 mg Oral Daily Mansy, Jan A, MD   40 mg at 04/09/19 6440  . piperacillin-tazobactam (ZOSYN) IVPB 3.375 g  3.375 g Intravenous Q8H Mansy, Jan A, MD 12.5 mL/hr at 04/10/19 0327    . rOPINIRole (REQUIP) tablet 4 mg  4 mg Oral TID Mansy, Jan A, MD   4 mg at 04/09/19 2114  . silver sulfADIAZINE (SILVADENE) 1 % cream   Topical BID Deborra Phegley, [place] F, MD      . sodium chloride flush (NS) 0.9 % injection 3 mL  3 mL Intravenous Once Gregor Hams, MD      . testosterone cypionate  (DEPOTESTOSTERONE CYPIONATE) injection 200 mg  200 mg Intramuscular Q14 Days Mansy, Jan A, MD      . traZODone (DESYREL) tablet 50 mg  50 mg Oral QHS PRN Mansy, Jan A, MD   50 mg at 04/09/19 0526  . vancomycin (VANCOCIN) 1,750 mg in sodium chloride 0.9 % 500 mL IVPB  1,750 mg Intravenous Q24H Mansy, Jan A, MD   Stopped at 04/10/19 0309     Review of Systems Full ROS  was asked and was negative except for the information on the HPI  Physical Exam Blood pressure 122/76, pulse 87, temperature 98.3 F (36.8 C), temperature source Oral, resp. rate 18, height 5\' 8"  (1.727 m), weight 127 kg, SpO2 97 %. CONSTITUTIONAL: NAD obese male  EYES: Pupils are equal, round, and reactive to light, Sclera are non-icteric. EARS, NOSE, MOUTH AND THROAT: The oropharynx is clear. The oral mucosa is pink and moist. Hearing is intact to voice. LYMPH NODES:  Lymph nodes in the neck are normal. RESPIRATORY:  Lungs are clear. There is normal respiratory effort, with equal breath sounds bilaterally, and without pathologic use of accessory muscles. CARDIOVASCULAR: Heart is regular without murmurs, gallops, or rubs. GI: The abdomen is  soft, nontender, and nondistended. There are no palpable masses. There is no hepatosplenomegaly. There are normal bowel sounds in all quadrants. GU: Rectal deferred.   MUSCULOSKELETAL:There is evidence of Left Leg cellulitis, blanching erythema , induration and edema. No evidence of compartment syndrome. Good distal perfusion to the foot and toes. No evidence of paralysis, motor intact on his foot. There is a superficial ulceration measuring 2 x 1 cm. No crepitus some serous drainage.   SKIN: Turgor is good and there are no pathologic skin lesions or ulcers. NEUROLOGIC: Motor and sensation is grossly normal. Cranial nerves are grossly intact. PSYCH:  Oriented to person, place and time. Affect is normal.  Data Reviewed  I have personally reviewed the patient's imaging, laboratory  findings and medical records.    Assessment/Plan 68 year old male with severe cellulitis of his left leg.  At this point there is no evidence of drainable collections or necrotizing soft tissue infection however given that he has not clinically resolved  as anticipated I do want to keep him n.p.o. and reevaluate him tomorrow.  I will also order a CBC for tomorrow if by tomorrow there is no improvement we will consider exploration to make sure he does not have any necrotizing soft tissue infection or any undrained collections.  I have discussed this with the patient in detail.  For now we will do Silvadene dressing to the superficial ulceration as well as Telfa and a wrap.  He needs to keep his leg elevated most of the time.  Discussed with nursing staff and the patient.  Tensive counseling provided   Caroleen Hamman, MD FACS General Surgeon 04/10/2019, 11:15 AM

## 2019-04-11 ENCOUNTER — Encounter
Admission: EM | Disposition: A | Discharge status: Home / Self Care | Payer: Self-pay | Source: Home / Self Care | Attending: Internal Medicine

## 2019-04-11 LAB — CBC
HCT: 34 % — ABNORMAL LOW (ref 39.0–52.0)
Hemoglobin: 10.2 g/dL — ABNORMAL LOW (ref 13.0–17.0)
MCH: 24.1 pg — ABNORMAL LOW (ref 26.0–34.0)
MCHC: 30 g/dL (ref 30.0–36.0)
MCV: 80.2 fL (ref 80.0–100.0)
Platelets: 288 10*3/uL (ref 150–400)
RBC: 4.24 MIL/uL (ref 4.22–5.81)
RDW: 16.9 % — ABNORMAL HIGH (ref 11.5–15.5)
WBC: 9.6 10*3/uL (ref 4.0–10.5)
nRBC: 0 % (ref 0.0–0.2)

## 2019-04-11 LAB — AEROBIC CULTURE W GRAM STAIN (SUPERFICIAL SPECIMEN): Special Requests: NORMAL

## 2019-04-11 LAB — BASIC METABOLIC PANEL
Anion gap: 4 — ABNORMAL LOW (ref 5–15)
BUN: 12 mg/dL (ref 8–23)
CO2: 24 mmol/L (ref 22–32)
Calcium: 8.3 mg/dL — ABNORMAL LOW (ref 8.9–10.3)
Chloride: 111 mmol/L (ref 98–111)
Creatinine, Ser: 1.12 mg/dL (ref 0.61–1.24)
GFR calc Af Amer: >60 mL/min (ref >60)
GFR calc non Af Amer: >60 mL/min (ref >60)
Glucose, Bld: 106 mg/dL — ABNORMAL HIGH (ref 70–99)
Potassium: 3.9 mmol/L (ref 3.5–5.1)
Sodium: 139 mmol/L (ref 135–145)

## 2019-04-11 SURGERY — IRRIGATION AND DEBRIDEMENT EXTREMITY
Anesthesia: General | Laterality: Left

## 2019-04-11 MED ORDER — CEFAZOLIN SODIUM-DEXTROSE 2-4 GM/100ML-% IV SOLN
2.0000 g | Freq: Three times a day (TID) | INTRAVENOUS | Status: DC
  Administered 2019-04-11 – 2019-04-13 (×6): 2 g via INTRAVENOUS
  Filled 2019-04-11 (×9): qty 100

## 2019-04-11 SURGICAL SUPPLY — 19 items
BLADE CLIPPER SURG (BLADE) ×3 IMPLANT
BLADE SURG 15 STRL LF DISP TIS (BLADE) ×1 IMPLANT
BLADE SURG 15 STRL SS (BLADE) ×2
CANISTER SUCT 1200ML W/VALVE (MISCELLANEOUS) ×3 IMPLANT
COVER WAND RF STERILE (DRAPES) ×3 IMPLANT
DRAPE LAPAROTOMY TRNSV 106X77 (MISCELLANEOUS) ×3 IMPLANT
ELECT CAUTERY BLADE 6.4 (BLADE) ×3 IMPLANT
ELECT REM PT RETURN 9FT ADLT (ELECTROSURGICAL) ×3
ELECTRODE REM PT RTRN 9FT ADLT (ELECTROSURGICAL) ×1 IMPLANT
GLOVE BIO SURGEON STRL SZ7 (GLOVE) ×3 IMPLANT
GOWN STRL REUS W/ TWL LRG LVL3 (GOWN DISPOSABLE) ×2 IMPLANT
GOWN STRL REUS W/TWL LRG LVL3 (GOWN DISPOSABLE) ×4
NEEDLE HYPO 22GX1.5 SAFETY (NEEDLE) ×3 IMPLANT
NS IRRIG 1000ML POUR BTL (IV SOLUTION) ×3 IMPLANT
PACK BASIN MINOR ARMC (MISCELLANEOUS) ×3 IMPLANT
SOL PREP PVP 2OZ (MISCELLANEOUS) ×3
SOLUTION PREP PVP 2OZ (MISCELLANEOUS) ×1 IMPLANT
SPONGE LAP 18X18 RF (DISPOSABLE) ×3 IMPLANT
SYR 20CC LL (SYRINGE) ×3 IMPLANT

## 2019-04-11 NOTE — Progress Notes (Signed)
CC: cellulitis w leg wound Subjective: Feels better, wbc now normalized, afebrile  Objective: Vital signs in last 24 hours: Temp:  [98.6 F (37 C)-98.9 F (37.2 C)] 98.6 F (37 C) (05/24 0436) Pulse Rate:  [82-89] 82 (05/24 0436) Resp:  [19-20] 19 (05/24 0436) BP: (125-136)/(75-83) 136/83 (05/24 0436) SpO2:  [95 %-97 %] 95 % (05/24 0436) Last BM Date: 04/08/19  Intake/Output from previous day: 05/23 0701 - 05/24 0700 In: 1510.9 [P.O.:960; I.V.:447.9; IV Piggyback:103] Out: 500 [Urine:500] Intake/Output this shift: No intake/output data recorded.  Physical exam:  NAD Abd: soft, nt, no peritonitis Ext: Left lef cellulitis w improvement as compared to yesterday, superficial ulceration on ant aspect. No evidence of necrotizing infection or drainable collections. Intact motor and sens function to his foot. Neuro: gcs 15, no motor or sens deficits  Lab Results: CBC  Recent Labs    04/09/19 0500 04/11/19 0608  WBC 20.5* 9.6  HGB 12.1* 10.2*  HCT 40.2 34.0*  PLT 336 288   BMET Recent Labs    04/09/19 0500 04/11/19 0608  NA 135 139  K 3.9 3.9  CL 109 111  CO2 17* 24  GLUCOSE 174* 106*  BUN 15 12  CREATININE 1.07 1.12  CALCIUM 7.5* 8.3*   PT/INR No results for input(s): LABPROT, INR in the last 72 hours. ABG No results for input(s): PHART, HCO3 in the last 72 hours.  Invalid input(s): PCO2, PO2  Studies/Results: Ct Tibia Fibula Left W Contrast  Result Date: 04/09/2019 CLINICAL DATA:  Left leg injury doing yard work 3-4 weeks ago. Increasing pain, erythema and swelling over the last day. Concert for underlying infection. EXAM: CT OF THE LOWER LEFT EXTREMITY WITH CONTRAST TECHNIQUE: Multidetector CT imaging of the left lower leg was performed according to the standard protocol following intravenous contrast administration. COMPARISON:  Doppler ultrasound 04/08/2019. Left lower leg radiographs 04/08/2019. CONTRAST:  124mL OMNIPAQUE IOHEXOL 300 MG/ML  SOLN FINDINGS:  Bones/Joint/Cartilage No evidence of acute fracture or dislocation. There is no bone destruction. 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 Mild degenerative changes are present at the left ankle. There is a small knee joint effusion with mild synovial enhancement. Ligaments Suboptimally assessed by CT. Muscles and Tendons No intramuscular fluid collection or foreign body demonstrated. The visualized ankle tendons are intact. Soft tissues There is apparent skin ulceration anteriorly in the mid lower leg with associated dermal thickening and ill-defined fluid in the underlying subcutaneous fat. There is no well-defined soft tissue abscess, foreign body or soft tissue emphysema. There is no adjacent periosteal thickening or cortical destruction. IMPRESSION: 1. Soft tissue ulceration anteriorly in the mid lower leg with ill-defined underlying fluid in the subcutaneous fat. No well-defined abscess. 2. No evidence of osteomyelitis. 3. 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 Electronically Signed   By: Richardean Sale M.D.   On: 04/09/2019 16:58    Anti-infectives: Anti-infectives (From admission, onward)   Start     Dose/Rate Route Frequency Ordered Stop   04/10/19 0100  vancomycin (VANCOCIN) 1,750 mg in sodium chloride 0.9 % 500 mL IVPB     1,750 mg 250 mL/hr over 120 Minutes Intravenous Every 24 hours 04/09/19 0507     04/09/19 0300  piperacillin-tazobactam (ZOSYN) IVPB 3.375 g     3.375 g 12.5 mL/hr over 240 Minutes Intravenous Every 8 hours 04/09/19 0132     04/09/19 0100  vancomycin (VANCOCIN) 2,000 mg in sodium chloride 0.9 %  500 mL IVPB     2,000 mg 250 mL/hr over 120 Minutes Intravenous  Once 04/09/19 0052 04/09/19 0506   04/09/19 0045  vancomycin (VANCOCIN) IVPB 1000 mg/200 mL premix  Status:  Discontinued     1,000 mg 200 mL/hr over 60 Minutes Intravenous  Once 04/09/19 0040 04/09/19 0052   04/09/19  0045  cefTRIAXone (ROCEPHIN) 2 g in sodium chloride 0.9 % 100 mL IVPB     2 g 200 mL/hr over 30 Minutes Intravenous  Once 04/09/19 0040 04/09/19 0148      Assessment/Plan: Severe cellulitis no evidence of drainable abscess or necrotizing infection Likely benefit from IV a/bs for one more day We will continue to follow Keep leg elevated. COntinue silvadene dressing to sup ulcer  Caroleen Hamman, MD, [facility]  04/11/2019

## 2019-04-11 NOTE — Progress Notes (Signed)
[facility] [place] NAME: Benjamin [name]    MR#:  [id number]  DATE OF BIRTH:  [birth year]  SUBJECTIVE:  CHIEF COMPLAINT:   Chief Complaint  Patient presents with  . Leg Injury   -Patient had a trauma to his left shin a month ago, has been having worsening redness and nonhealing wound and so presents to the emergency room. Says his swelling and redness is somewhat better.  Not much pain unless he touches his leg or the wound.  REVIEW OF SYSTEMS:  Review of Systems  Constitutional: Negative for chills, fever and malaise/fatigue.  HENT: Negative for congestion, ear discharge, hearing loss and nosebleeds.   Eyes: Negative for blurred vision and double vision.  Respiratory: Negative for cough, shortness of breath and wheezing.   Cardiovascular: Positive for leg swelling. Negative for chest pain and palpitations.  Gastrointestinal: Negative for abdominal pain, constipation, diarrhea, nausea and vomiting.  Genitourinary: Negative for dysuria.  Neurological: Negative for dizziness, seizures and headaches.  Psychiatric/Behavioral: Negative for depression.    DRUG ALLERGIES:   Allergies  Allergen Reactions  . Pramipexole Itching    VITALS:  Blood pressure 136/83, pulse 82, temperature 98.6 F (37 C), resp. rate 19, height 5\' 8"  (1.727 m), weight 127 kg, SpO2 95 %.  PHYSICAL EXAMINATION:  Physical Exam   GENERAL:  68 y.o.-year-old obese patient lying in the bed with no acute distress.  EYES: Pupils equal, round, reactive to light and accommodation. No scleral icterus. Extraocular muscles intact.  HEENT: Head atraumatic, normocephalic. Oropharynx and nasopharynx clear.  NECK:  Supple, no jugular venous distention. No thyroid enlargement, no tenderness.  LUNGS: Normal breath sounds bilaterally, no wheezing, rales,rhonchi or crepitation. No use of accessory muscles of respiration.  Decreased bibasilar breath sounds CARDIOVASCULAR: S1,  S2 normal. No murmurs, rubs, or gallops.  ABDOMEN: Soft, nontender, nondistended. Bowel sounds present. No organomegaly or mass.  EXTREMITIES: Left foot 2+ edema, swollen left leg with erythema and open wounds with purulent discharge.  No  cyanosis, or clubbing.  NEUROLOGIC: Cranial nerves II through XII are intact. Muscle strength 5/5 in all extremities. Sensation intact. Gait not checked.  PSYCHIATRIC: The patient is alert and oriented x 3.  SKIN: No obvious rash, lesion, or ulcer.    LABORATORY PANEL:   CBC Recent Labs  Lab 04/11/19 0608  WBC 9.6  HGB 10.2*  HCT 34.0*  PLT 288   ------------------------------------------------------------------------------------------------------------------  Chemistries  Recent Labs  Lab 04/08/19 2102  04/11/19 0608  NA 136   < > 139  K 4.1   < > 3.9  CL 104   < > 111  CO2 23   < > 24  GLUCOSE 124*   < > 106*  BUN 18   < > 12  CREATININE 1.21   < > 1.12  CALCIUM 8.8*   < > 8.3*  AST 21  --   --   ALT 21  --   --   ALKPHOS 68  --   --   BILITOT 0.7  --   --    < > = values in this interval not displayed.   ------------------------------------------------------------------------------------------------------------------  Cardiac Enzymes No results for input(s): TROPONINI in the last 168 hours. ------------------------------------------------------------------------------------------------------------------  RADIOLOGY:  Ct Tibia Fibula Left W Contrast  Result Date: 04/09/2019 CLINICAL DATA:  Left leg injury doing yard work 3-4 weeks ago. Increasing pain, erythema and swelling over the last day. Concert for underlying infection. EXAM: CT  OF THE LOWER LEFT EXTREMITY WITH CONTRAST TECHNIQUE: Multidetector CT imaging of the left lower leg was performed according to the standard protocol following intravenous contrast administration. COMPARISON:  Doppler ultrasound 04/08/2019. Left lower leg radiographs 04/08/2019. CONTRAST:  139mL  OMNIPAQUE IOHEXOL 300 MG/ML  SOLN FINDINGS: Bones/Joint/Cartilage No evidence of acute fracture or dislocation. There is no bone destruction. 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 Mild degenerative changes are present at the left ankle. There is a small knee joint effusion with mild synovial enhancement. Ligaments Suboptimally assessed by CT. Muscles and Tendons No intramuscular fluid collection or foreign body demonstrated. The visualized ankle tendons are intact. Soft tissues There is apparent skin ulceration anteriorly in the mid lower leg with associated dermal thickening and ill-defined fluid in the underlying subcutaneous fat. There is no well-defined soft tissue abscess, foreign body or soft tissue emphysema. There is no adjacent periosteal thickening or cortical destruction. IMPRESSION: 1. Soft tissue ulceration anteriorly in the mid lower leg with ill-defined underlying fluid in the subcutaneous fat. No well-defined abscess. 2. No evidence of osteomyelitis. 3. 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 Electronically Signed   By: Richardean Sale M.D.   On: 04/09/2019 16:58    EKG:   Orders placed or performed during the hospital encounter of 04/08/19  . ED EKG 12-Lead  . ED EKG 12-Lead  . EKG 12-Lead  . EKG 12-Lead    ASSESSMENT AND PLAN:   68 year old male with past medical history significant for depression, GERD, sleep apnea presents to hospital secondary to worsening left leg swelling, erythema and draining wound for almost 3-4 weeks now.  1.  Sepsis-secondary to left lower extremity cellulitis with draining wounds- started after a trauma. - Elevated white count, elevated lactic acid, fever noted - Swollen and erythematous.  Continue IV antibiotics with vancomycin and Zosyn - Appreciated infectious disease consult.  She has recommended CT to rule out underlying abscess-no deep involvement but  soft tissue collections and swelling. - Surgery has been consulted as well.  As wound is getting better and no drainable abscess, surgery suggested to continue IV antibiotic and leg elevation for now. -Blood cultures and topical wound cultures have been taken.  2.  Paroxysmal atrial fibrillation-happened several years ago due to lack of sleep.  Only 2 episodes.  Transiently was placed on Cardizem and Eliquis.  Patient has stopped taking them for a long time and do not wish to be on them.  3.  GERD-on Protonix  4.  Depression-on Prozac and Wellbutrin  5.  DVT prophylaxis-Lovenox  Independent at baseline   All the records are reviewed and case discussed with Care Management/Social Workerr. Management plans discussed with the patient, family and they are in agreement.  CODE STATUS: Full Code  TOTAL TIME TAKING CARE OF THIS PATIENT: 39 minutes.   POSSIBLE D/C IN 2 DAYS, DEPENDING ON CLINICAL CONDITION.   Vaughan Basta M.D on 04/11/2019 at 1:47 PM  Between 7am to 6pm - Pager - [phone]  After 6pm go to [email or web address] - password [email or web address] [facility]  Office  [phone]  CC: Primary care physician; Sharyne Peach, MD

## 2019-04-12 DIAGNOSIS — L089 Local infection of the skin and subcutaneous tissue, unspecified: Secondary | ICD-10-CM

## 2019-04-12 DIAGNOSIS — B9561 Methicillin susceptible Staphylococcus aureus infection as the cause of diseases classified elsewhere: Secondary | ICD-10-CM

## 2019-04-12 LAB — MAGNESIUM: Magnesium: 2.1 mg/dL (ref 1.7–2.4)

## 2019-04-12 NOTE — Care Management Important Message (Signed)
Important Message  Patient Details  Name: Benjamin [name] MRN: [id number] Date of Birth: 1951-10-22   Medicare Important Message Given:  Yes    Dannette Barbara 04/12/2019, 10:29 AM

## 2019-04-12 NOTE — Progress Notes (Signed)
Patient reporting that he feels like he is having a lot of PVCs, and like he felt twice before when he had afib.  Paged Dr. Anselm Jungling to make him aware, and verbal order received for cardiac monitoring and check magnesium level.  Clarise Cruz, BSN

## 2019-04-12 NOTE — Progress Notes (Signed)
[facility] Hospital Day(s): 3.   Post op day(s): 1 Day Post-Op.   Interval History: Patient seen and examined, no acute events or new complaints overnight. Patient reports left leg continue with severe swollen and redness.  He denies any improvement since yesterday.  Pain localized on the wound area with significant itching.  Pain does not radiate.  Pain improved with pain medication there is no aggravating factor.  Fever or chills  Vital signs in last 24 hours: [min-max] current  Temp:  [98.6 F (37 C)-99 F (37.2 C)] 98.6 F (37 C) (05/25 0518) Pulse Rate:  [72-91] 72 (05/25 0518) Resp:  [17-19] 17 (05/25 0518) BP: (102-132)/(74-95) 121/77 (05/25 0518) SpO2:  [95 %-98 %] 95 % (05/25 0518)     Height: 5\' 8"  (172.7 cm) Weight: 127 kg BMI (Calculated): 42.58   Physical Exam:  Constitutional: alert, cooperative and no distress  Respiratory: breathing non-labored at rest  Cardiovascular: regular rate and sinus rhythm  Gastrointestinal: soft, non-tender, and non-distended Extremity: Left leg severe cellulitis with pitting edema x2.  5 cm x 1.5 ulcer without necrotic tissue, no fluid collection.      Labs:  CBC Latest Ref Rng & Units 04/11/2019 04/09/2019 04/08/2019  WBC 4.0 - 10.5 K/uL 9.6 20.5(H) 18.2(H)  Hemoglobin 13.0 - 17.0 g/dL 10.2(L) 12.1(L) 12.7(L)  Hematocrit 39.0 - 52.0 % 34.0(L) 40.2 40.4  Platelets 150 - 400 K/uL 288 336 337   CMP Latest Ref Rng & Units 04/11/2019 04/09/2019 04/08/2019  Glucose 70 - 99 mg/dL 106(H) 174(H) 124(H)  BUN 8 - 23 mg/dL 12 15 18   Creatinine 0.61 - 1.24 mg/dL 1.12 1.07 1.21  Sodium 135 - 145 mmol/L 139 135 136  Potassium 3.5 - 5.1 mmol/L 3.9 3.9 4.1  Chloride 98 - 111 mmol/L 111 109 104  CO2 22 - 32 mmol/L 24 17(L) 23  Calcium 8.9 - 10.3 mg/dL 8.3(L) 7.5(L) 8.8(L)  Total Protein 6.5 - 8.1 g/dL - - 6.9  Total Bilirubin 0.3 - 1.2 mg/dL - - 0.7  Alkaline Phos 38 - 126 U/L - - 68  AST 15 - 41 U/L - - 21  ALT 0 - 44 U/L - - 21     Imaging studies: I personally reviewed the images of the lower extremity ultrasound which shows no thrombosis on the veins.  There was no evaluation for functionality, venous reflux or insufficiency.  Also evaluated the CT scan images and there is no drainable collection.  Significant edema.   Assessment/Plan:  68 y.o. male with left leg nonhealing ulcer, complicated by pertinent comorbidities including obstructive sleep apnea, obesity and GERD. Patient with a one-month history of left leg nonhealing ulcer.  He reports that it was getting better until recently that he started to get getting more swollen and recurrent with a cellulitis.  There is no drainable collection but there is severe edema and cellulitis around the ulcer.  There is no leukocytosis, that improved from 20,000.  I considered that the IV antibiotic is doing its job in controlling the infection but the swelling is not letting the wound to heal.  I think that the patient will benefit of a compressive dressing with pain and/or calamine wound care.  I agreed to continue dressing changes today with Aquacel and Silvadene cream and to continue IV antibiotics but as soon as wound care is available we will recommend Unna boots placement.  I considered that he has venous insufficiency due to the amount of edema.  The ultrasound done does not evaluate for insufficiency protocol it was just done to rule out DVT.  He might benefit as outpatient evaluation for vascular surgery if he continued having problem with edema and ulcers.   Arnold Long, MD

## 2019-04-12 NOTE — Consult Note (Signed)
[facility] nurse team consulted at the time of admission. Reviewed images and notes. Surgery was also consulted and have provided wound care orders.  Silvadene BID, I have updated orders with topical cover dressings needed.    Re consult if needed, will not follow at this time. Thanks  Enes Rokosz [employer], RN,CWOCN, CNS, [place] [phone])

## 2019-04-12 NOTE — Progress Notes (Signed)
[facility] [place] NAME: Benjamin [name]    MR#:  [id number]  DATE OF BIRTH:  [birth year]  SUBJECTIVE:  CHIEF COMPLAINT:   Chief Complaint  Patient presents with  . Leg Injury   -Patient had a trauma to his left shin a month ago, has been having worsening redness and nonhealing wound and so presents to the emergency room. Says his swelling and redness is somewhat better.  Not much pain unless he touches his leg or the wound.  Still persisted swelling and redness.  REVIEW OF SYSTEMS:  Review of Systems  Constitutional: Negative for chills, fever and malaise/fatigue.  HENT: Negative for congestion, ear discharge, hearing loss and nosebleeds.   Eyes: Negative for blurred vision and double vision.  Respiratory: Negative for cough, shortness of breath and wheezing.   Cardiovascular: Positive for leg swelling. Negative for chest pain and palpitations.  Gastrointestinal: Negative for abdominal pain, constipation, diarrhea, nausea and vomiting.  Genitourinary: Negative for dysuria.  Neurological: Negative for dizziness, seizures and headaches.  Psychiatric/Behavioral: Negative for depression.    DRUG ALLERGIES:   Allergies  Allergen Reactions  . Pramipexole Itching    VITALS:  Blood pressure 133/70, pulse 80, temperature 99.4 F (37.4 C), temperature source Oral, resp. rate 18, height 5\' 8"  (1.727 m), weight 127 kg, SpO2 94 %.  PHYSICAL EXAMINATION:  Physical Exam   GENERAL:  68 y.o.-year-old obese patient lying in the bed with no acute distress.  EYES: Pupils equal, round, reactive to light and accommodation. No scleral icterus. Extraocular muscles intact.  HEENT: Head atraumatic, normocephalic. Oropharynx and nasopharynx clear.  NECK:  Supple, no jugular venous distention. No thyroid enlargement, no tenderness.  LUNGS: Normal breath sounds bilaterally, no wheezing, rales,rhonchi or crepitation. No use of accessory muscles of  respiration.  Decreased bibasilar breath sounds CARDIOVASCULAR: S1, S2 normal. No murmurs, rubs, or gallops.  ABDOMEN: Soft, nontender, nondistended. Bowel sounds present. No organomegaly or mass.  EXTREMITIES: Left foot 2+ edema, swollen left leg with erythema and open wounds with purulent discharge.  No  cyanosis, or clubbing.  NEUROLOGIC: Cranial nerves II through XII are intact. Muscle strength 5/5 in all extremities. Sensation intact. Gait not checked.  PSYCHIATRIC: The patient is alert and oriented x 3.  SKIN: No obvious rash, lesion, or ulcer.    LABORATORY PANEL:   CBC Recent Labs  Lab 04/11/19 0608  WBC 9.6  HGB 10.2*  HCT 34.0*  PLT 288   ------------------------------------------------------------------------------------------------------------------  Chemistries  Recent Labs  Lab 04/08/19 2102  04/11/19 0608 04/12/19 1624  NA 136   < > 139  --   K 4.1   < > 3.9  --   CL 104   < > 111  --   CO2 23   < > 24  --   GLUCOSE 124*   < > 106*  --   BUN 18   < > 12  --   CREATININE 1.21   < > 1.12  --   CALCIUM 8.8*   < > 8.3*  --   MG  --   --   --  2.1  AST 21  --   --   --   ALT 21  --   --   --   ALKPHOS 68  --   --   --   BILITOT 0.7  --   --   --    < > = values in this interval  not displayed.   ------------------------------------------------------------------------------------------------------------------  Cardiac Enzymes No results for input(s): TROPONINI in the last 168 hours. ------------------------------------------------------------------------------------------------------------------  RADIOLOGY:  No results found.  EKG:   Orders placed or performed during the hospital encounter of 04/08/19  . ED EKG 12-Lead  . ED EKG 12-Lead  . EKG 12-Lead  . EKG 12-Lead    ASSESSMENT AND PLAN:   68 year old male with past medical history significant for depression, GERD, sleep apnea presents to hospital secondary to worsening left leg swelling,  erythema and draining wound for almost 3-4 weeks now.  1.  Sepsis-secondary to left lower extremity cellulitis with draining wounds- started after a trauma. - Elevated white count, elevated lactic acid, fever noted - Swollen and erythematous.  Continue IV antibiotics with vancomycin and Zosyn - Appreciated infectious disease consult.  She has recommended CT to rule out underlying abscess-no deep involvement but soft tissue collections and swelling. - Surgery has been consulted as well.  As wound is getting better and no drainable abscess, surgery suggested to continue IV antibiotic and leg elevation for now. -Blood cultures and topical wound cultures have been taken. -Still have persistent swelling and redness, ID suggested to reevaluate the decision of need for I&D.  To be decided by surgical team.  2.  Paroxysmal atrial fibrillation-happened several years ago due to lack of sleep.  Only 2 episodes.  Transiently was placed on Cardizem and Eliquis.  Patient has stopped taking them for a long time and do not wish to be on them.  3.  GERD-on Protonix  4.  Depression-on Prozac and Wellbutrin  5.  DVT prophylaxis-Lovenox  Independent at baseline   All the records are reviewed and case discussed with Care Management/Social Workerr. Management plans discussed with the patient, family and they are in agreement.  CODE STATUS: Full Code  TOTAL TIME TAKING CARE OF THIS PATIENT: 32 minutes.   POSSIBLE D/C IN 2 DAYS, DEPENDING ON CLINICAL CONDITION.   Vaughan Basta M.D on 04/12/2019 at 5:35 PM  Between 7am to 6pm - Pager - [phone]  After 6pm go to [email or web address] - password [email or web address] [facility]  Office  [phone]  CC: Primary care physician; Sharyne Peach, MD

## 2019-04-12 NOTE — Progress Notes (Signed)
   Date of Admission:  04/08/2019    Subjective: Says the leg is swollen and the thigh is tight   Medications:  . buPROPion  300 mg Oral Daily  . enoxaparin (LOVENOX) injection  40 mg Subcutaneous Q12H  . FLUoxetine  20 mg Oral BH-q7a  . pantoprazole  40 mg Oral Daily  . rOPINIRole  4 mg Oral TID  . silver sulfADIAZINE   Topical BID  . sodium chloride flush  3 mL Intravenous Once  . testosterone cypionate  200 mg Intramuscular Q14 Days    Objective: Vital signs in last 24 hours: Temp:  [98.6 F (37 C)-99 F (37.2 C)] 98.6 F (37 C) (05/25 0518) Pulse Rate:  [72-91] 72 (05/25 0518) Resp:  [17-19] 17 (05/25 0518) BP: (102-132)/(74-95) 121/77 (05/25 0518) SpO2:  [95 %-98 %] 95 % (05/25 0518)  PHYSICAL EXAM:  General: Alert, cooperative, no distress, appears stated age.  Extremities: Left leg swelling and erythema persist 04/12/19   04/09/19     Neurologic: Grossly non-focal  Lab Results Recent Labs    04/11/19 0608  WBC 9.6  HGB 10.2*  HCT 34.0*  NA 139  K 3.9  CL 111  CO2 24  BUN 12  CREATININE 1.12   Liver Panel No results for input(s): PROT, ALBUMIN, AST, ALT, ALKPHOS, BILITOT, BILIDIR, IBILI in the last 72 hours. Sedimentation Rate No results for input(s): ESRSEDRATE in the last 72 hours. C-Reactive Protein No results for input(s): CRP in the last 72 hours.  Microbiology: MSSA  In wound culture 5/22  Studies/Results: No results found.   Assessment/Plan: ?68 y.o. male is admitted with fever and painful left leg wound. Pt says on 4/29 when he was cutting a tree in his yard a branch fell on his leg thru his jean. There was hardly any blood, but a scratch. The leg became blue and black the next day and was swollen. Over the next 2-3 weeks the swelling gradually reduced until there was a small patch of area on the shin which was oozing some fluid and was discolored. He covered it with dressing . 3 days ago he went to [employer] to get more dressing and got  an new non occlusive dressing and applied. Within 24 hrs when he removed the dressing he saw a whole lot of fluid draining and then after a few hours his leg was hurting, was red and temp was 100. It then became 102 and his wife drove him to the ED on 5/21/at 8pm. For the past month patient did not seek any medical help. He did not take any antibiotics .on questioning he says his dog would have licked the wound a few times ? ? Left leg wound with surrounding cellulitis .following an injury 3 weeks ago. This would have started as hematoma and now it is secondarily infected. Concerning  for an underlying abscess Has MSSA in would culture On cefazolin CT leg  shows Soft tissue ulceration anteriorly in the mid lower leg with ill-defined underlying fluid in the subcutaneous fat. No well-defined abscess.No osteoSeen by surgery Will discuss with them to see whether he needs I/D as not significant improvement with antibiotic  Discussed the management with patient and his nurse

## 2019-04-13 ENCOUNTER — Inpatient Hospital Stay: Payer: Medicare Other

## 2019-04-13 MED ORDER — SODIUM CHLORIDE 0.9 % IV SOLN
3.0000 g | Freq: Four times a day (QID) | INTRAVENOUS | Status: DC
  Administered 2019-04-13 – 2019-04-15 (×7): 3 g via INTRAVENOUS
  Filled 2019-04-13 (×10): qty 3

## 2019-04-13 MED ORDER — IOHEXOL 300 MG/ML  SOLN
100.0000 mL | Freq: Once | INTRAMUSCULAR | Status: AC | PRN
  Administered 2019-04-13: 10:00:00 100 mL via INTRAVENOUS

## 2019-04-13 NOTE — Progress Notes (Signed)
[facility] SURGICAL PROGRESS NOTE (cpt [phone])  Hospital Day(s): 4.   Post op day(s): 2 Days Post-Op.   Interval History: Patient seen and examined, no acute events or new complaints overnight. Patient reports that he is still having significant LLE swelling which has not improved since yesterday. No fevers, chills. Leukocytosis resolved yesterday. He does not feel there has been any purulent drainage from the wound. No other complaints. Still undergoing wound care with silvadene.   Review of Systems:  Constitutional: denies fever, chills  Respiratory: denies any shortness of breath  Cardiovascular: denies chest pain or palpitations  Gastrointestinal: denies abdominal pain, N/V, or diarrhea/and bowel function as per interval history Genitourinary: denies burning with urination or urinary frequency Musculoskeletal: + LLE pain/Swelling Integumentary: + LLE Erythems   Vital signs in last 24 hours: [min-max] current  Temp:  [98.2 F (36.8 C)-99.4 F (37.4 C)] 98.2 F (36.8 C) (05/26 0414) Pulse Rate:  [80-82] 82 (05/26 0414) Resp:  [18-19] 19 (05/26 0414) BP: (124-133)/(68-71) 124/68 (05/26 0414) SpO2:  [94 %-96 %] 96 % (05/26 0414)     Height: 5\' 8"  (172.7 cm) Weight: 127 kg BMI (Calculated): 42.58   Intake/Output this shift:  No intake/output data recorded.    Physical Exam:  Constitutional: alert, cooperative and no distress  HENT: normocephalic without obvious abnormality  Eyes: PERRL, EOM's grossly intact and symmetric  Respiratory: breathing non-labored at rest  Integumentary: Ulceration to the central tibia with two smaller ulceration lateral to that, no purulent drainage, some serous drainage, no appreciable fluctuance or crepitus   Labs:  CBC Latest Ref Rng & Units 04/11/2019 04/09/2019 04/08/2019  WBC 4.0 - 10.5 K/uL 9.6 20.5(H) 18.2(H)  Hemoglobin 13.0 - 17.0 g/dL 10.2(L) 12.1(L) 12.7(L)  Hematocrit 39.0 - 52.0 % 34.0(L) 40.2 40.4  Platelets 150 -  400 K/uL 288 336 337   CMP Latest Ref Rng & Units 04/11/2019 04/09/2019 04/08/2019  Glucose 70 - 99 mg/dL 106(H) 174(H) 124(H)  BUN 8 - 23 mg/dL 12 15 18   Creatinine 0.61 - 1.24 mg/dL 1.12 1.07 1.21  Sodium 135 - 145 mmol/L 139 135 136  Potassium 3.5 - 5.1 mmol/L 3.9 3.9 4.1  Chloride 98 - 111 mmol/L 111 109 104  CO2 22 - 32 mmol/L 24 17(L) 23  Calcium 8.9 - 10.3 mg/dL 8.3(L) 7.5(L) 8.8(L)  Total Protein 6.5 - 8.1 g/dL - - 6.9  Total Bilirubin 0.3 - 1.2 mg/dL - - 0.7  Alkaline Phos 38 - 126 U/L - - 68  AST 15 - 41 U/L - - 21  ALT 0 - 44 U/L - - 21     Imaging studies: No new pertinent imaging studies   Assessment/Plan: (ICD-10's: L03.116) 68 y.o. male with left lower leg non-healing ulcer and surrounding cellulitis from an injury ~3 weeks ago, complicated by pertinent comorbidities including obstructive sleep apnea, obesity and GERD.   - Will make NPO for now  - Plan for repeat CT imaging of the LLE for evaluation for developing abscess  - Continue IV Abx (Cefazolin, Day 1)   - Pain control prn  - Dressing changes with Aquacel/Silvadene; may benefit from compressive devices (unna boots)   - WOC RN following  - Medical management of comorbidities per primary service   All of the above findings and recommendations were discussed with the patient, and the medical team, and all of patient's questions were answered to his expressed satisfaction.   -- Edison Simon, PA-C [facility] 04/13/2019, 7:33 AM  [phone] M-F: 7am - 4pm

## 2019-04-13 NOTE — Progress Notes (Signed)
[name]    MR#:  [id number]  DATE OF BIRTH:  1951/04/12  SUBJECTIVE:  CHIEF COMPLAINT:   Chief Complaint  Patient presents with   Leg Injury   -Patient had a trauma to his left shin a month ago, has been having worsening redness and nonhealing wound and so presents to the emergency room. Says his swelling and redness is somewhat better.  Not much pain unless he touches his leg or the wound.  Still persisted swelling and redness.  REVIEW OF SYSTEMS:  Review of Systems  Constitutional: Negative for chills, fever and malaise/fatigue.  HENT: Negative for congestion, ear discharge, hearing loss and nosebleeds.   Eyes: Negative for blurred vision and double vision.  Respiratory: Negative for cough, shortness of breath and wheezing.   Cardiovascular: Positive for leg swelling. Negative for chest pain and palpitations.  Gastrointestinal: Negative for abdominal pain, constipation, diarrhea, nausea and vomiting.  Genitourinary: Negative for dysuria.  Neurological: Negative for dizziness, seizures and headaches.  Psychiatric/Behavioral: Negative for depression.    DRUG ALLERGIES:   Allergies  Allergen Reactions   Pramipexole Itching    VITALS:  Blood pressure 138/78, pulse 89, temperature 99.2 F (37.3 C), temperature source Oral, resp. rate 20, height 5\' 8"  (1.727 m), weight 127 kg, SpO2 99 %.  PHYSICAL EXAMINATION:  Physical Exam   GENERAL:  68 y.o.-year-old obese patient lying in the bed with no acute distress.  EYES: Pupils equal, round, reactive to light and accommodation. No scleral icterus. Extraocular muscles intact.  HEENT: Head atraumatic, normocephalic. Oropharynx and nasopharynx clear.  NECK:  Supple, no jugular venous distention. No thyroid enlargement, no tenderness.  LUNGS: Normal breath sounds bilaterally, no wheezing, rales,rhonchi or crepitation. No use of accessory muscles of  respiration.  Decreased bibasilar breath sounds CARDIOVASCULAR: S1, S2 normal. No murmurs, rubs, or gallops.  ABDOMEN: Soft, nontender, nondistended. Bowel sounds present. No organomegaly or mass.  EXTREMITIES: Left foot 2+ edema, swollen left leg with erythema and open wounds with purulent discharge.  No  cyanosis, or clubbing.  NEUROLOGIC: Cranial nerves II through XII are intact. Muscle strength 5/5 in all extremities. Sensation intact. Gait not checked.  PSYCHIATRIC: The patient is alert and oriented x 3.  SKIN: No obvious rash, lesion, or ulcer.    LABORATORY PANEL:   CBC Recent Labs  Lab 04/11/19 0608  WBC 9.6  HGB 10.2*  HCT 34.0*  PLT 288   ------------------------------------------------------------------------------------------------------------------  Chemistries  Recent Labs  Lab 04/08/19 2102  04/11/19 0608 04/12/19 1624  NA 136   < > 139  --   K 4.1   < > 3.9  --   CL 104   < > 111  --   CO2 23   < > 24  --   GLUCOSE 124*   < > 106*  --   BUN 18   < > 12  --   CREATININE 1.21   < > 1.12  --   CALCIUM 8.8*   < > 8.3*  --   MG  --   --   --  2.1  AST 21  --   --   --   ALT 21  --   --   --   ALKPHOS 68  --   --   --   BILITOT 0.7  --   --   --    < > = values in this interval  not displayed.   ------------------------------------------------------------------------------------------------------------------  Cardiac Enzymes No results for input(s): TROPONINI in the last 168 hours. ------------------------------------------------------------------------------------------------------------------  RADIOLOGY:  Ct Tibia Fibula Left W Contrast  Result Date: 04/13/2019 CLINICAL DATA:  Soft tissue swelling and erythema anteriorly in the left lower leg. Cellulitis not improving with treatment. Evaluate for abscess. EXAM: CT OF THE LOWER LEFT EXTREMITY WITH CONTRAST TECHNIQUE: Multidetector CT imaging of the left lower leg was performed according to the standard  protocol following intravenous contrast administration. COMPARISON:  Radiographs 04/08/2019.  CT 04/09/2019. CONTRAST:  177mL OMNIPAQUE IOHEXOL 300 MG/ML  SOLN FINDINGS: Bones/Joint/Cartilage No evidence of acute fracture, dislocation or bone destruction. Tricompartmental degenerative changes are again noted at the left knee. The knee joint is incompletely visualized. No large joint effusions. Ligaments Suboptimally assessed by CT. Muscles and Tendons No focal intramuscular fluid collection or abnormal enhancement. The visualized ankle tendons appear intact. Soft tissues No change in skin ulceration involving the anterior aspect of the mid lower leg. Associated dermal thickening and underlying ill-defined fluid in the subcutaneous fat are not significantly changed. There is no well-defined fluid collection, focal enhancement, foreign body or soft tissue emphysema. IMPRESSION: 1. Overall, no significant changes are seen compared with the prior CT from 4 days ago. 2. Stable soft tissue ulceration anteriorly in the mid lower leg with dermal thickening and ill-defined fluid in the subcutaneous fat consistent with cellulitis. No well-defined abscess. 3. No evidence of myositis or osteomyelitis. Electronically Signed   By: Richardean Sale M.D.   On: 04/13/2019 10:11    EKG:   Orders placed or performed during the hospital encounter of 04/08/19   ED EKG 12-Lead   ED EKG 12-Lead   EKG 12-Lead   EKG 12-Lead    ASSESSMENT AND PLAN:   68 year old male with past medical history significant for depression, GERD, sleep apnea presents to hospital secondary to worsening left leg swelling, erythema and draining wound for almost 3-4 weeks now.  1.  Sepsis-secondary to left lower extremity cellulitis with draining wounds- started after a trauma. - Elevated white count, elevated lactic acid, fever noted - Swollen and erythematous.  Continue IV antibiotics with vancomycin and Zosyn - Appreciated infectious disease  consult.  She has recommended CT to rule out underlying abscess-no deep involvement but soft tissue collections and swelling. - Surgery has been consulted as well.  As wound is getting better and no drainable abscess, surgery suggested to continue IV antibiotic and leg elevation for now. -Blood cultures and topical wound cultures have been taken. -Still have persistent swelling and redness, ID suggested to reevaluate the decision of need for I&D.  To be decided by surgical team. -Surgery ordered repeat CT of the leg it still does not show drainable free fluid or abscess.  Surgery suggesting to change antibiotics with help of infectious disease.  2.  Paroxysmal atrial fibrillation-happened several years ago due to lack of sleep.  Only 2 episodes.  Transiently was placed on Cardizem and Eliquis.  Patient has stopped taking them for a long time and do not wish to be on them.  3.  GERD-on Protonix  4.  Depression-on Prozac and Wellbutrin  5.  DVT prophylaxis-Lovenox  Independent at baseline   All the records are reviewed and case discussed with Care Management/Social Workerr. Management plans discussed with the patient, family and they are in agreement.  CODE STATUS: Full Code  TOTAL TIME TAKING CARE OF THIS PATIENT: 32 minutes.   POSSIBLE D/C IN 2 DAYS, DEPENDING ON CLINICAL  CONDITION.   Vaughan Basta M.D on 04/13/2019 at 4:42 PM  Between 7am to 6pm - Pager - [phone]  After 6pm go to [email or web address] - password [email or web address] [facility]  Office  [phone]  CC: Primary care physician; Sharyne Peach, MD

## 2019-04-13 NOTE — Progress Notes (Signed)
Date of Admission:  04/08/2019     Subjective: Says the leg is still very swollen  Medications:  . buPROPion  300 mg Oral Daily  . enoxaparin (LOVENOX) injection  40 mg Subcutaneous Q12H  . FLUoxetine  20 mg Oral BH-[id number]  . pantoprazole  40 mg Oral Daily  . rOPINIRole  4 mg Oral TID  . silver sulfADIAZINE   Topical BID  . sodium chloride flush  3 mL Intravenous Once  . testosterone cypionate  200 mg Intramuscular Q14 Days    Objective: Vital signs in last 24 hours: Temp:  [98.2 F (36.8 C)-99.4 F (37.4 C)] 98.2 F (36.8 C) (05/26 0414) Pulse Rate:  [80-82] 82 (05/26 0414) Resp:  [18-19] 19 (05/26 0414) BP: (124-133)/(68-71) 124/68 (05/26 0414) SpO2:  [94 %-96 %] 96 % (05/26 0414)  PHYSICAL EXAM:  General: Alert, cooperative, no distress, appears stated age.  Left leg dressing not removed Edema foot Lab Results Recent Labs    04/11/19 0608  WBC 9.6  HGB 10.2*  HCT 34.0*  NA 139  K 3.9  CL 111  CO2 24  BUN 12  CREATININE 1.12   Liver Panel No results for input(s): PROT, ALBUMIN, AST, ALT, ALKPHOS, BILITOT, BILIDIR, IBILI in the last 72 hours. Sedimentation Rate No results for input(s): ESRSEDRATE in the last 72 hours. C-Reactive Protein No results for input(s): CRP in the last 72 hours.  Microbiology:  Studies/Results: Ct Tibia Fibula Left W Contrast  Result Date: 04/13/2019 CLINICAL DATA:  Soft tissue swelling and erythema anteriorly in the left lower leg. Cellulitis not improving with treatment. Evaluate for abscess. EXAM: CT OF THE LOWER LEFT EXTREMITY WITH CONTRAST TECHNIQUE: Multidetector CT imaging of the left lower leg was performed according to the standard protocol following intravenous contrast administration. COMPARISON:  Radiographs 04/08/2019.  CT 04/09/2019. CONTRAST:  128mL OMNIPAQUE IOHEXOL 300 MG/ML  SOLN FINDINGS: Bones/Joint/Cartilage No evidence of acute fracture, dislocation or bone destruction. Tricompartmental degenerative changes are  again noted at the left knee. The knee joint is incompletely visualized. No large joint effusions. Ligaments Suboptimally assessed by CT. Muscles and Tendons No focal intramuscular fluid collection or abnormal enhancement. The visualized ankle tendons appear intact. Soft tissues No change in skin ulceration involving the anterior aspect of the mid lower leg. Associated dermal thickening and underlying ill-defined fluid in the subcutaneous fat are not significantly changed. There is no well-defined fluid collection, focal enhancement, foreign body or soft tissue emphysema. IMPRESSION: 1. Overall, no significant changes are seen compared with the prior CT from 4 days ago. 2. Stable soft tissue ulceration anteriorly in the mid lower leg with dermal thickening and ill-defined fluid in the subcutaneous fat consistent with cellulitis. No well-defined abscess. 3. No evidence of myositis or osteomyelitis. Electronically Signed   By: Richardean Sale M.D.   On: 04/13/2019 10:11     Assessment/Plan: 68 y.o.maleis admitted with fever and painful left leg wound. Pt says on 4/29 when he was cutting a tree in his yard a branch fell on his leg thru his jean. There was hardly any blood, but a scratch. The leg became blue and black the next day and was swollen. Over the next 2-3 weeks the swelling gradually reduced until there was a small patch of area on the shin which was oozing some fluid and was discolored. He covered it with dressing . 3 days ago he went to [employer] to get more dressing and got an new non occlusive dressing and applied. Within  24 hrs when he removed the dressing he saw a whole lot of fluid draining and then after a few hours his leg was hurting, was red and temp was 100. It then became 102 and his wife drove him to the ED on 5/21/at 8pm. For the past month patient did not seek any medical help. He did not take any antibiotics .on questioning he says his dog would have licked the wound a few  times ? ? Left leg wound with surrounding cellulitis .following an injury 3 weeks ago. This would have started as hematoma and now it is secondarily infected. Concerning for an underlying abscess Has MSSA in would culture On cefazolin which should cover MSSA very well but as the progress is slow  -will change to Ampicillin/sulbactam  CT leg  showsSoft tissue ulceration anteriorly in the mid lower leg with ill-defined underlying fluid in the subcutaneous fat. No well-defined abscess.No osteoSeen by surgery Discussed with surgery - they repeated CT leg again and there was ill defined [place] collection- Surgical intervention not needed as per them Discussed the management with patient and his nurse

## 2019-04-14 LAB — CULTURE, BLOOD (ROUTINE X 2)
Culture: NO GROWTH
Culture: NO GROWTH
Special Requests: ADEQUATE

## 2019-04-14 NOTE — Progress Notes (Addendum)
[facility] at [place] NAME: Benjamin [name]    MR#:  [id number]  DATE OF BIRTH:  1951/09/13  SUBJECTIVE:  Left leg cellulitis somewhat improved per patient, per nursing staff-patient continues to pick at his wound/dressings, infectious disease input greatly appreciated, continue empiric Unasyn  REVIEW OF SYSTEMS:  Review of Systems  Constitutional: Negative for chills, fever and malaise/fatigue.  HENT: Negative for congestion, ear discharge, hearing loss and nosebleeds.   Eyes: Negative for blurred vision and double vision.  Respiratory: Negative for cough, shortness of breath and wheezing.   Cardiovascular: Positive for leg swelling. Negative for chest pain and palpitations.  Gastrointestinal: Negative for abdominal pain, constipation, diarrhea, nausea and vomiting.  Genitourinary: Negative for dysuria.  Neurological: Negative for dizziness, seizures and headaches.  Psychiatric/Behavioral: Negative for depression.    DRUG ALLERGIES:   Allergies  Allergen Reactions  . Pramipexole Itching    VITALS:  Blood pressure 129/72, pulse 82, temperature 97.7 F (36.5 C), temperature source Oral, resp. rate 16, height 5\' 8"  (1.727 m), weight 127 kg, SpO2 96 %.  PHYSICAL EXAMINATION:  Physical Exam   GENERAL:  68 y.o.-year-old obese patient lying in the bed with no acute distress.  EYES: Pupils equal, round, reactive to light and accommodation. No scleral icterus. Extraocular muscles intact.  HEENT: Head atraumatic, normocephalic. Oropharynx and nasopharynx clear.  NECK:  Supple, no jugular venous distention. No thyroid enlargement, no tenderness.  LUNGS: Normal breath sounds bilaterally, no wheezing, rales,rhonchi or crepitation. No use of accessory muscles of respiration.  Decreased bibasilar breath sounds CARDIOVASCULAR: S1, S2 normal. No murmurs, rubs, or gallops.  ABDOMEN: Soft, nontender, nondistended. Bowel sounds present. No  organomegaly or mass.  EXTREMITIES: Left foot 2+ edema, swollen left leg with erythema and open wounds with purulent discharge.  No  cyanosis, or clubbing.  NEUROLOGIC: Cranial nerves II through XII are intact. Muscle strength 5/5 in all extremities. Sensation intact. Gait not checked.  PSYCHIATRIC: The patient is alert and oriented x 3.  SKIN: No obvious rash, lesion, or ulcer.    LABORATORY PANEL:   CBC Recent Labs  Lab 04/11/19 0608  WBC 9.6  HGB 10.2*  HCT 34.0*  PLT 288   ------------------------------------------------------------------------------------------------------------------  Chemistries  Recent Labs  Lab 04/08/19 2102  04/11/19 0608 04/12/19 1624  NA 136   < > 139  --   K 4.1   < > 3.9  --   CL 104   < > 111  --   CO2 23   < > 24  --   GLUCOSE 124*   < > 106*  --   BUN 18   < > 12  --   CREATININE 1.21   < > 1.12  --   CALCIUM 8.8*   < > 8.3*  --   MG  --   --   --  2.1  AST 21  --   --   --   ALT 21  --   --   --   ALKPHOS 68  --   --   --   BILITOT 0.7  --   --   --    < > = values in this interval not displayed.   ------------------------------------------------------------------------------------------------------------------  Cardiac Enzymes No results for input(s): TROPONINI in the last 168 hours. ------------------------------------------------------------------------------------------------------------------  RADIOLOGY:  Ct Tibia Fibula Left W Contrast  Result Date: 04/13/2019 CLINICAL DATA:  Soft tissue swelling and erythema anteriorly in the left  lower leg. Cellulitis not improving with treatment. Evaluate for abscess. EXAM: CT OF THE LOWER LEFT EXTREMITY WITH CONTRAST TECHNIQUE: Multidetector CT imaging of the left lower leg was performed according to the standard protocol following intravenous contrast administration. COMPARISON:  Radiographs 04/08/2019.  CT 04/09/2019. CONTRAST:  164mL OMNIPAQUE IOHEXOL 300 MG/ML  SOLN FINDINGS:  Bones/Joint/Cartilage No evidence of acute fracture, dislocation or bone destruction. Tricompartmental degenerative changes are again noted at the left knee. The knee joint is incompletely visualized. No large joint effusions. Ligaments Suboptimally assessed by CT. Muscles and Tendons No focal intramuscular fluid collection or abnormal enhancement. The visualized ankle tendons appear intact. Soft tissues No change in skin ulceration involving the anterior aspect of the mid lower leg. Associated dermal thickening and underlying ill-defined fluid in the subcutaneous fat are not significantly changed. There is no well-defined fluid collection, focal enhancement, foreign body or soft tissue emphysema. IMPRESSION: 1. Overall, no significant changes are seen compared with the prior CT from 4 days ago. 2. Stable soft tissue ulceration anteriorly in the mid lower leg with dermal thickening and ill-defined fluid in the subcutaneous fat consistent with cellulitis. No well-defined abscess. 3. No evidence of myositis or osteomyelitis. Electronically Signed   By: Richardean Sale M.D.   On: 04/13/2019 10:11    EKG:   Orders placed or performed during the hospital encounter of 04/08/19  . ED EKG 12-Lead  . ED EKG 12-Lead  . EKG 12-Lead  . EKG 12-Lead    ASSESSMENT AND PLAN:  68 year old male with past medical history significant for depression, GERD, sleep apnea presents to hospital secondary to worsening left leg swelling, erythema and draining wound for almost 3-4 weeks now.  * Sepsis secondary to left lower extremity infected wound with associated cellulitis  Resolved  Continue sepsis protocol, empiric Unasyn, infectious disease input greatly appreciated  Wound cultures noted for staph aureus which is pansensitive   *Acute left lower extremity infected wound with associated cellulitis  Resolving slowly  Antibiotics per above, infectious disease input greatly appreciated-we will reach out to infectious  disease to see if patient could be changed to oral agents versus continued IV antibiotics/PICC line with possible plans for discharge on tomorrow   * Paroxysmal atrial fibrillation Stable Transiently was placed on Cardizem and Eliquis.  Patient has stopped taking them for a long time and do not wish to be on them.  *GERD PPI  *Depression Prozac and Wellbutrin  DVT prophylaxis with Lovenox subcu Disposition Home in 1 to 2 days barring any complications  All the records are reviewed and case discussed with Care Management/Social Workerr. Management plans discussed with the patient, family and they are in agreement.  CODE STATUS: Full Code  TOTAL TIME TAKING CARE OF THIS PATIENT: 35 minutes.   POSSIBLE D/C IN 1-2 DAYS, DEPENDING ON CLINICAL CONDITION.   Avel Peace Salary M.D on 04/14/2019 at 1:18 PM  Between 7am to 6pm - Pager - [phone]  After 6pm go to [email or web address] - password [email or web address] [facility]  Office  [phone]  CC: Primary care physician; Sharyne Peach, MD

## 2019-04-14 NOTE — Progress Notes (Signed)
ID Wound left leg  MSSA in culture On unasyn On discharge will be Augmentin 875mg  PO BID for 10-14 days. Discussed with patient

## 2019-04-14 NOTE — Progress Notes (Signed)
[facility] SURGICAL PROGRESS NOTE (cpt [phone])  Hospital Day(s): 5.   Post op day(s): 3 Days Post-Op.   Interval History: Patient seen and examined, no acute events or new complaints overnight. Patient reports that he does believe his swelling and redness of his LLE have improved this morning. No fever or chills overnight. There was a note reporting "pus" from the wound overnight however I suspect this may have been the silvadene cream from dressing changes. Serosanguinous fluid is able to be expressed from the ulceration this morning. No additional complaints. Mobilizing well. Tolerating diet.   Review of Systems:  Constitutional: denies fever, chills  Respiratory: denies any shortness of breath  Cardiovascular: denies chest pain or palpitations  Gastrointestinal: denies N/V, diarrhea/and bowel function as per interval history Genitourinary: denies burning with urination or urinary frequency Integumentary: + LLE Ulceration/Celllitis    Vital signs in last 24 hours: [min-max] current  Temp:  [97.7 F (36.5 C)-99.2 F (37.3 C)] 97.7 F (36.5 C) (05/27 0528) Pulse Rate:  [82-90] 82 (05/27 0528) Resp:  [16-20] 16 (05/27 0528) BP: (129-150)/(72-80) 129/72 (05/27 0528) SpO2:  [96 %-99 %] 96 % (05/27 0528)     Height: 5\' 8"  (172.7 cm) Weight: 127 kg BMI (Calculated): 42.58   Intake/Output this shift:  Total I/O In: 238.5 [I.V.:34.3; IV Piggyback:204.2] Out: -     Physical Exam:  Constitutional: alert, cooperative and no distress  HENT: normocephalic without obvious abnormality  Eyes: PERRL, EOM's grossly intact and symmetric  Respiratory: breathing non-labored at rest  Integumentary: Ulceration to the central tibia with two smaller ulceration lateral to that, no purulent drainage, able to express serosanguinous fluid from ulceration, no appreciable fluctuance or crepitus.  Musculoskeletal: Edema to the LLE from just distal to the knee to the dorsal foot but this is  improved. He does have trace pitting edema to RLE (may be his baseline)   Labs:  CBC Latest Ref Rng & Units 04/11/2019 04/09/2019 04/08/2019  WBC 4.0 - 10.5 K/uL 9.6 20.5(H) 18.2(H)  Hemoglobin 13.0 - 17.0 g/dL 10.2(L) 12.1(L) 12.7(L)  Hematocrit 39.0 - 52.0 % 34.0(L) 40.2 40.4  Platelets 150 - 400 K/uL 288 336 337   CMP Latest Ref Rng & Units 04/11/2019 04/09/2019 04/08/2019  Glucose 70 - 99 mg/dL 106(H) 174(H) 124(H)  BUN 8 - 23 mg/dL 12 15 18   Creatinine 0.61 - 1.24 mg/dL 1.12 1.07 1.21  Sodium 135 - 145 mmol/L 139 135 136  Potassium 3.5 - 5.1 mmol/L 3.9 3.9 4.1  Chloride 98 - 111 mmol/L 111 109 104  CO2 22 - 32 mmol/L 24 17(L) 23  Calcium 8.9 - 10.3 mg/dL 8.3(L) 7.5(L) 8.8(L)  Total Protein 6.5 - 8.1 g/dL - - 6.9  Total Bilirubin 0.3 - 1.2 mg/dL - - 0.7  Alkaline Phos 38 - 126 U/L - - 68  AST 15 - 41 U/L - - 21  ALT 0 - 44 U/L - - 21     Imaging studies: No new pertinent imaging studies   Assessment/Plan: (ICD-10's: L03.116) 68 y.o. male with clinically improving left lower leg non-healing ulcer draining expressible serosanguinous fluid and surrounding cellulitis from an injury ~1-1 weeks ago, complicated by pertinent comorbidities including obstructive sleep apnea, obesity and GERD.   - Okay to continue regular diet   - No indication for surgical intervention  - Continue IV Abx; switch to Unasyn yesterday (first dose today)   - Pain control prn   - WOC RN to see today for possible  unna boot place to aid with edema   - Continue dressing changes as needed  - Medical management of comorbidities    All of the above findings and recommendations were discussed with the patient, and the medical team, and all of patient's questions were answered to his expressed satisfaction.   -- Edison Simon, PA-C [facility] 04/14/2019, 8:32 AM [phone] M-F: 7am - 4pm

## 2019-04-14 NOTE — Consult Note (Signed)
[facility] Nurse wound consult note Reason for Consult: left lower leg cellulitis Request for Unna's boot to be placed Wound type: initially trauma 3-4 weeks ago, developed cellulitis, admitted for IV abtx.  Consulted by vascular 04/09/19: evaluated for compartment syndrome. Recommended topical wound care and elevation Consulted by surgery 04/10/19: cellulitis, no necrotizing soft tissue infection; wound care silvadene and non adherent dressing with elevation Pressure Injury POA: NA Measurement: 3cm x 1.5cm x 0.2cm  Wound bed: 90% yellow/10% pink Drainage (amount, consistency, odor) serosanguinous on the dressing when I arrived Periwound: erythema, edema  Dressing procedure/placement/frequency: Applied Silvadene to the wound bed (per previous orders) Covered with silicone foam (patient is sensitive to tape) Applied Unna's boot from the first metatarsal head to the patellar notch and then applied Coban in same fashion Video chat with patient's wife during application Explained he will need to have changed weekly. Patient seemed to prefer wound care center over [facility].   appt made for [facility] for June 3rd at 9:45am  Notified bedside nurse and patient of same.   Discussed POC with patient and bedside nurse.  Re consult if needed, will not follow at this time. Thanks  Rashay Barnette [employer], RN,CWOCN, CNS, [place] [phone])

## 2019-04-14 NOTE — Care Management Important Message (Signed)
Important Message  Patient Details  Name: Benjamin [name] MRN: [id number] Date of Birth: 1951/06/21   Medicare Important Message Given:  Yes    Su Hilt, RN 04/14/2019, 9:44 AM

## 2019-04-14 NOTE — Progress Notes (Signed)
Took down dressing from wound. Washed area with soap and warm water and dried well. While cleaning around the wound area there was a moderate amount of  serosanguineous drainage coming from wound and some chunks of white pus and what appeared to be purulent drainage only when the peri-wound was touched.

## 2019-04-15 MED ORDER — AMOXICILLIN-POT CLAVULANATE 875-125 MG PO TABS: 1.0000 | ORAL_TABLET | Freq: Two times a day (BID) | ORAL | 0 refills | Status: DC

## 2019-04-15 NOTE — Progress Notes (Signed)
Pt being discharged, discharge instructions reviewed with pt, states understanding, pt with no complaints

## 2019-04-15 NOTE — Discharge Summary (Signed)
[facility] [place] NAME: Benjamin [name]    MR#:  [id number]  DATE OF BIRTH:  07-06-1951  DATE OF ADMISSION:  04/08/2019 ADMITTING PHYSICIAN: Christel Mormon, MD  DATE OF DISCHARGE: No discharge date for patient encounter.  PRIMARY CARE PHYSICIAN: Sharyne Peach, MD    ADMISSION DIAGNOSIS:  Cellulitis and abscess of left lower extremity [L03.116, L02.416] Sepsis, due to unspecified organism, unspecified whether acute organ dysfunction present ([place]) [A41.9]  DISCHARGE DIAGNOSIS:  Active Problems:   Lower extremity cellulitis   SECONDARY DIAGNOSIS:   Past Medical History:  Diagnosis Date  . Depression   . GERD (gastroesophageal reflux disease)   . OSA (obstructive sleep apnea)     HOSPITAL COURSE:  68 year old male with past medical history significant for depression, GERD, sleep apnea presents to hospital secondary to worsening left leg swelling, erythema and draining wound for almost 3-4 weeks now.  * Sepsis Resolved secondary to left lower extremity infected wound with associated cellulitis  Resolved  Treated on our sepsis protocol, treated with empiric Unasyn, infectious disease did see patient while in house-recommended Augmentin status post discharge to complete 10-14-day course  Wound cultures from leg noted for staph aureus which was pansensitive   *Acute left lower extremity infected wound with associated cellulitis  Resolving  Patient to follow-up with wound clinic status post discharge for reevaluation next week, all other plans as stated above   * Paroxysmal atrial fibrillation Stable Transiently was placed on Cardizem and Eliquis.  Patient had stopped taking them for a long time and did not wish to be on them.  Eliquis restarted at discharge-patient encouraged to follow-up with his primary care provider for continued medical management/care  *GERD PPI  *Depression Prozac and Wellbutrin  DISCHARGE  CONDITIONS:   stable  CONSULTS OBTAINED:  Treatment Team:  Tsosie Billing, MD Jules Husbands, MD Kaleen Rochette, Avel Peace, MD  DRUG ALLERGIES:   Allergies  Allergen Reactions  . Pramipexole Itching    DISCHARGE MEDICATIONS:   Allergies as of 04/15/2019      Reactions   Pramipexole Itching      Medication List    TAKE these medications   amoxicillin-clavulanate 875-125 MG tablet Commonly known as:  Augmentin Take 1 tablet by mouth 2 (two) times daily.   apixaban 5 MG Tabs tablet Commonly known as:  ELIQUIS Take 1 tablet (5 mg total) by mouth 2 (two) times daily.   buPROPion 150 MG 24 hr tablet Commonly known as:  WELLBUTRIN XL Take 300 mg by mouth daily.   FLUoxetine 20 MG capsule Commonly known as:  PROZAC Take 20 mg by mouth every morning.   fluticasone 50 MCG/ACT nasal spray Commonly known as:  FLONASE Place 2 sprays into both nostrils daily.   oxyCODONE-acetaminophen 10-325 MG tablet Commonly known as:  Percocet Take 1 tablet by mouth every 6 (six) hours as needed for pain.   pantoprazole 40 MG tablet Commonly known as:  PROTONIX Take 40 mg by mouth daily.   ropinirole 5 MG tablet Commonly known as:  REQUIP Take 15 mg by mouth at bedtime.   testosterone cypionate 200 MG/ML injection Commonly known as:  DEPOTESTOSTERONE CYPIONATE Inject 200 mg into the muscle every 14 (fourteen) days.   traZODone 50 MG tablet Commonly known as:  DESYREL Take 50 mg by mouth at bedtime as needed.        DISCHARGE INSTRUCTIONS:      If you experience worsening of  your admission symptoms, develop shortness of breath, life threatening emergency, suicidal or homicidal thoughts you must seek medical attention immediately by calling 911 or calling your MD immediately  if symptoms less severe.  You Must read complete instructions/literature along with all the possible adverse reactions/side effects for all the Medicines you take and that have been prescribed to  you. Take any new Medicines after you have completely understood and accept all the possible adverse reactions/side effects.   Please note  You were cared for by a hospitalist during your hospital stay. If you have any questions about your discharge medications or the care you received while you were in the hospital after you are discharged, you can call the unit and asked to speak with the hospitalist on call if the hospitalist that took care of you is not available. Once you are discharged, your primary care physician will handle any further medical issues. Please note that NO REFILLS for any discharge medications will be authorized once you are discharged, as it is imperative that you return to your primary care physician (or establish a relationship with a primary care physician if you do not have one) for your aftercare needs so that they can reassess your need for medications and monitor your lab values.    Today   CHIEF COMPLAINT:   Chief Complaint  Patient presents with  . Leg Injury    HISTORY OF PRESENT ILLNESS:   68 y.o. Caucasian male with a known history of depression, GERD and obstructive sleep apnea, who presented to the emergency room with a concern of left leg swelling with pain, erythema and warmth and draining wound after having an injury about 3 weeks ago when a tree that he cut accidentally fell from his truck and hit his shin.  The patient developed fever tonight 102 with associated chills.  He admitted to mild dizziness.  No paresthesias or focal muscle weakness.  No chest pain or dyspnea or cough or wheezing or hemoptysis.  No dysuria oliguria or hematuria or flank pain.  He has been having urinary urgency though.  No recent sick exposures.  His COVID-19 test came back negative.  Upon presentation to the emergency room, temperature was 100.6 and heart rate was 128 with otherwise normal vital signs.  Labs were remarkable for significant leukocytosis of 18.2 with  neutrophilia and a creatinine of 1.21.  X-ray of the left leg revealed diffuse soft tissue swelling without acute osseous abnormality.  Venous duplex revealed no evidence for DVT.  The patient was given IV vancomycin and ceftriaxone in the ER.  He will be admitted to a medical bed for further evaluation and management  VITAL SIGNS:  Blood pressure 140/70, pulse 73, temperature 97.6 F (36.4 C), temperature source Oral, resp. rate 16, height 5\' 8"  (1.727 m), weight 127 kg, SpO2 96 %.  I/O:    Intake/Output Summary (Last 24 hours) at 04/15/2019 1016 Last data filed at 04/15/2019 0900 Gross per 24 hour  Intake 1223.68 ml  Output -  Net 1223.68 ml    PHYSICAL EXAMINATION:  GENERAL:  68 y.o.-year-old patient lying in the bed with no acute distress.  EYES: Pupils equal, round, reactive to light and accommodation. No scleral icterus. Extraocular muscles intact.  HEENT: Head atraumatic, normocephalic. Oropharynx and nasopharynx clear.  NECK:  Supple, no jugular venous distention. No thyroid enlargement, no tenderness.  LUNGS: Normal breath sounds bilaterally, no wheezing, rales,rhonchi or crepitation. No use of accessory muscles of respiration.  CARDIOVASCULAR: S1, S2  normal. No murmurs, rubs, or gallops.  ABDOMEN: Soft, non-tender, non-distended. Bowel sounds present. No organomegaly or mass.  EXTREMITIES: No pedal edema, cyanosis, or clubbing.  NEUROLOGIC: Cranial nerves II through XII are intact. Muscle strength 5/5 in all extremities. Sensation intact. Gait not checked.  PSYCHIATRIC: The patient is alert and oriented x 3.  SKIN: No obvious rash, lesion, or ulcer.   DATA REVIEW:   CBC Recent Labs  Lab 04/11/19 0608  WBC 9.6  HGB 10.2*  HCT 34.0*  PLT 288    Chemistries  Recent Labs  Lab 04/08/19 2102  04/11/19 0608 04/12/19 1624  NA 136   < > 139  --   K 4.1   < > 3.9  --   CL 104   < > 111  --   CO2 23   < > 24  --   GLUCOSE 124*   < > 106*  --   BUN 18   < > 12  --    CREATININE 1.21   < > 1.12  --   CALCIUM 8.8*   < > 8.3*  --   MG  --   --   --  2.1  AST 21  --   --   --   ALT 21  --   --   --   ALKPHOS 68  --   --   --   BILITOT 0.7  --   --   --    < > = values in this interval not displayed.    Cardiac Enzymes No results for input(s): TROPONINI in the last 168 hours.  Microbiology Results  Results for orders placed or performed during the hospital encounter of 04/08/19  Urine culture     Status: None   Collection Time: 04/08/19  9:02 PM  Result Value Ref Range Status   Specimen Description   Final    URINE, RANDOM Performed at [facility], [address] [place], [place] [address]    Special Requests   Final    NONE Performed at [facility], [address] [place], [place] [address]    Culture   Final    NO GROWTH Performed at [facility], [place] [address] [place], [place] [address]    Report Status 04/10/2019 FINAL  Final  Blood Culture (routine x 2)     Status: None   Collection Time: 04/09/19 12:40 AM  Result Value Ref Range Status   Specimen Description BLOOD RIGHT ANTECUBITAL  Final   Special Requests   Final    BOTTLES DRAWN AEROBIC AND ANAEROBIC Blood Culture results may not be optimal due to an excessive volume of blood received in culture bottles   Culture   Final    NO GROWTH 5 DAYS Performed at [facility], [address], [place], [place] [address]    Report Status 04/14/2019 FINAL  Final  SARS Coronavirus 2 (CEPHEID - Performed in [place] hospital lab), Hosp Order     Status: None   Collection Time: 04/09/19 12:41 AM  Result Value Ref Range Status   SARS Coronavirus 2 NEGATIVE NEGATIVE Final    Comment: (NOTE) If result is NEGATIVE SARS-CoV-2 target nucleic acids are NOT DETECTED. The SARS-CoV-2 RNA is generally detectable in upper and lower  respiratory specimens during the acute phase of infection. The lowest  concentration of SARS-CoV-2 viral copies this assay  can detect is 250  copies / mL. A negative result  does not preclude SARS-CoV-2 infection  and should not be used as the sole basis for treatment or other  patient management decisions.  A negative result may occur with  improper specimen collection / handling, submission of specimen other  than nasopharyngeal swab, presence of viral mutation(s) within the  areas targeted by this assay, and inadequate number of viral copies  (<250 copies / mL). A negative result must be combined with clinical  observations, patient history, and epidemiological information. If result is POSITIVE SARS-CoV-2 target nucleic acids are DETECTED. The SARS-CoV-2 RNA is generally detectable in upper and lower  respiratory specimens dur ing the acute phase of infection.  Positive  results are indicative of active infection with SARS-CoV-2.  Clinical  correlation with patient history and other diagnostic information is  necessary to determine patient infection status.  Positive results do  not rule out bacterial infection or co-infection with other viruses. If result is PRESUMPTIVE POSTIVE SARS-CoV-2 nucleic acids MAY BE PRESENT.   A presumptive positive result was obtained on the submitted specimen  and confirmed on repeat testing.  While 2019 novel coronavirus  (SARS-CoV-2) nucleic acids may be present in the submitted sample  additional confirmatory testing may be necessary for epidemiological  and / or clinical management purposes  to differentiate between  SARS-CoV-2 and other Sarbecovirus currently known to infect humans.  If clinically indicated additional testing with an alternate test  methodology [phone]) is advised. The SARS-CoV-2 RNA is generally  detectable in upper and lower respiratory sp ecimens during the acute  phase of infection. The expected result is Negative. Fact Sheet for Patients:  StrictlyIdeas.no Fact Sheet for Healthcare  Providers: BankingDealers.co.za This test is not yet approved or cleared by the [place] FDA and has been authorized for detection and/or diagnosis of SARS-CoV-2 by FDA under an Emergency Use Authorization (EUA).  This EUA will remain in effect (meaning this test can be used) for the duration of the COVID-19 declaration under Section 564(b)(1) of the Act, 21 U.S.C. section 360bbb-3(b)(1), unless the authorization is terminated or revoked sooner. Performed at [facility], [address] [place], [place] [address]   Blood Culture (routine x 2)     Status: None   Collection Time: 04/09/19 12:45 AM  Result Value Ref Range Status   Specimen Description BLOOD BLOOD RIGHT FOREARM  Final   Special Requests   Final    BOTTLES DRAWN AEROBIC AND ANAEROBIC Blood Culture adequate volume   Culture   Final    NO GROWTH 5 DAYS Performed at [facility], [address], [place], [place] [address]    Report Status 04/14/2019 FINAL  Final  Aerobic Culture (superficial specimen)     Status: None   Collection Time: 04/09/19  7:23 AM  Result Value Ref Range Status   Specimen Description   Final    WOUND LEG Performed at [facility], [place] [address] [place], [place] [address]    Special Requests   Final    Normal Performed at [place], [place] [address]    Gram Stain   Final    MODERATE WBC PRESENT, PREDOMINANTLY PMN MODERATE GRAM POSITIVE COCCI IN PAIRS IN CLUSTERS RARE GRAM NEGATIVE RODS Performed at [facility], [place] [address] [place], [place] [address]    Culture ABUNDANT STAPHYLOCOCCUS AUREUS  Final   Report Status 04/11/2019 FINAL  Final   Organism ID, Bacteria STAPHYLOCOCCUS AUREUS  Final  Susceptibility   Staphylococcus aureus - MIC*    CIPROFLOXACIN <=0.5 SENSITIVE Sensitive     ERYTHROMYCIN <=0.25 SENSITIVE Sensitive     GENTAMICIN <=0.5 SENSITIVE Sensitive     OXACILLIN 0.5  SENSITIVE Sensitive     TETRACYCLINE <=1 SENSITIVE Sensitive     VANCOMYCIN 1 SENSITIVE Sensitive     TRIMETH/SULFA <=10 SENSITIVE Sensitive     CLINDAMYCIN <=0.25 SENSITIVE Sensitive     RIFAMPIN <=0.5 SENSITIVE Sensitive     Inducible Clindamycin NEGATIVE Sensitive     * ABUNDANT STAPHYLOCOCCUS AUREUS    RADIOLOGY:  No results found.  EKG:   Orders placed or performed during the hospital encounter of 04/08/19  . ED EKG 12-Lead  . ED EKG 12-Lead  . EKG 12-Lead  . EKG 12-Lead      Management plans discussed with the patient, family and they are in agreement.  CODE STATUS:     Code Status Orders  (From admission, onward)         Start     Ordered   04/09/19 0128  Full code  Continuous     04/09/19 0132        Code Status History    Date Active Date Inactive Code Status Order ID Comments User Context   05/06/2018 1144 05/07/2018 1501 Full Code [id number]  Saundra Shelling, MD Inpatient   02/04/2016 1627 02/05/2016 2111 Full Code [id number]  Bettey Costa, MD ED      TOTAL TIME TAKING CARE OF THIS PATIENT: 40 minutes.    Avel Peace Jayleigh Notarianni M.D on 04/15/2019 at 10:16 AM  Between 7am to 6pm - Pager - [phone]  After 6pm go to [email or web address] - password [email or web address] [facility]  Office  [phone]  CC: Primary care physician; Sharyne Peach, MD   Note: This dictation was prepared with Dragon dictation along with smaller phrase technology. Any transcriptional errors that result from this process are unintentional.

## 2019-04-15 NOTE — Progress Notes (Signed)
Pt refused wheelchair at discharge. 

## 2019-04-15 NOTE — Progress Notes (Addendum)
[name] SURGICAL PROGRESS NOTE (cpt [phone])  Hospital Day(s): 6.   Post op day(s): 4 Days Post-Op.   Interval History: Patient seen and examined, no acute events or new complaints overnight. Patient reports that he is feeling good. He reports minimal leg pain near the ulceration and itching but otherwise notes improvement. No fever, chills. He is tolerating a diet. No additional complaints.   Review of Systems:  Constitutional: denies fever, chills  Respiratory: denies any shortness of breath  Cardiovascular: denies chest pain or palpitations  Gastrointestinal: denies N/V, or diarrhea/and bowel function as per interval history Musculoskeletal: + LLE pain (improved) Integumentary: + LLE Eythema  Vital signs in last 24 hours: [min-max] current  Temp:  [97.6 F (36.4 C)-99.2 F (37.3 C)] 97.6 F (36.4 C) (05/28 0448) Pulse Rate:  [73-85] 73 (05/28 0448) Resp:  [16-20] 16 (05/28 0448) BP: (122-140)/(69-86) 140/70 (05/28 0448) SpO2:  [95 %-96 %] 96 % (05/28 0448)     Height: 5\' 8"  (172.7 cm) Weight: 127 kg BMI (Calculated): 42.58   Intake/Output this shift:  No intake/output data recorded.    Physical Exam:  Constitutional: alert, cooperative and no distress  HENT: normocephalic without obvious abnormality  Eyes: PERRL, EOM's grossly intact and symmetric  Respiratory: breathing non-labored at rest  Musculoskeletal: Unna boot to the left lower extremity, no erythema extending beyond the wrap, neurovascularly intact   Labs:  CBC Latest Ref Rng & Units 04/11/2019 04/09/2019 04/08/2019  WBC 4.0 - 10.5 K/uL 9.6 20.5(H) 18.2(H)  Hemoglobin 13.0 - 17.0 g/dL 10.2(L) 12.1(L) 12.7(L)  Hematocrit 39.0 - 52.0 % 34.0(L) 40.2 40.4  Platelets 150 - 400 K/uL 288 336 337   CMP Latest Ref Rng & Units 04/11/2019 04/09/2019 04/08/2019  Glucose 70 - 99 mg/dL 106(H) 174(H) 124(H)  BUN 8 - 23 mg/dL 12 15 18   Creatinine 0.61 - 1.24 mg/dL 1.12 1.07 1.21  Sodium 135 - 145 mmol/L 139  135 136  Potassium 3.5 - 5.1 mmol/L 3.9 3.9 4.1  Chloride 98 - 111 mmol/L 111 109 104  CO2 22 - 32 mmol/L 24 17(L) 23  Calcium 8.9 - 10.3 mg/dL 8.3(L) 7.5(L) 8.8(L)  Total Protein 6.5 - 8.1 g/dL - - 6.9  Total Bilirubin 0.3 - 1.2 mg/dL - - 0.7  Alkaline Phos 38 - 126 U/L - - 68  AST 15 - 41 U/L - - 21  ALT 0 - 44 U/L - - 21     Imaging studies: No new pertinent imaging studies   Assessment/Plan: (ICD-10's: L03.116) 68 y.o. male with clinically improving left lower leg non-healing ulcer draining expressible serosanguinous fluid and surrounding cellulitis from an injury ~3-4 weeks ago now with unna boot placed 81/44, complicated by pertinent comorbidities includingobstructive sleep apnea, obesity and GERD.   - Continue unna boot, typically ~1 week  - Continue IV ABx while in house; appreciate ID input and recommendations   - Pain control PRN  - Appreciate WOC RN input; follow up in wound clinic as scheduled  - No acute surgical needs; will sign -off; please re-consult if new issue/questions arise   All of the above findings and recommendations were discussed with the patient, and the medical team, and all of patient's questions were answered to his expressed satisfaction.  -- Edison Simon, PA-C [facility] 04/15/2019, 8:53 AM [phone] M-F: 7am - 4pm

## 2019-04-16 ENCOUNTER — Other Ambulatory Visit: Payer: Self-pay | Admitting: Neurosurgery

## 2019-04-16 DIAGNOSIS — M5412 Radiculopathy, cervical region: Secondary | ICD-10-CM

## 2019-04-20 ENCOUNTER — Ambulatory Visit
Admission: RE | Admit: 2019-04-20 | Discharge: 2019-04-20 | Disposition: A | Discharge status: Home / Self Care | Payer: Medicare Other | Source: Ambulatory Visit | Attending: Neurosurgery | Admitting: Neurosurgery

## 2019-04-20 ENCOUNTER — Other Ambulatory Visit: Payer: Self-pay

## 2019-04-20 DIAGNOSIS — M5412 Radiculopathy, cervical region: Secondary | ICD-10-CM | POA: Diagnosis not present

## 2019-04-21 ENCOUNTER — Ambulatory Visit: Payer: BLUE CROSS/BLUE SHIELD | Admitting: Internal Medicine

## 2019-04-26 ENCOUNTER — Other Ambulatory Visit: Payer: Self-pay

## 2019-04-26 ENCOUNTER — Encounter: Payer: Medicare Other | Attending: Physician Assistant | Admitting: Physician Assistant

## 2019-04-26 DIAGNOSIS — L97822 Non-pressure chronic ulcer of other part of left lower leg with fat layer exposed: Secondary | ICD-10-CM | POA: Insufficient documentation

## 2019-04-26 DIAGNOSIS — I10 Essential (primary) hypertension: Secondary | ICD-10-CM | POA: Diagnosis not present

## 2019-04-26 DIAGNOSIS — Z881 Allergy status to other antibiotic agents status: Secondary | ICD-10-CM | POA: Diagnosis not present

## 2019-04-26 NOTE — Progress Notes (Signed)
DIETRICK, BARRIS ([id number]) Visit Report for 04/26/2019 Abuse/Suicide Risk Screen Details Patient Name: ROBB, SIBAL. Date of Service: 04/26/2019 9:00 AM Medical Record Number: [id number] Patient Account Number: [id number] Date of Birth/Sex: 1951-01-17 (68 y.o. M) Treating RN: Army Melia Primary Care Amada Hallisey: Salome Holmes Other Clinician: Referring Yannick Steuber: Loney Hering Treating Florestine Carmical/Extender: Melburn Hake, HOYT Weeks in Treatment: 0 Abuse/Suicide Risk Screen Items Answer ABUSE RISK SCREEN: Has anyone close to you tried to hurt or harm you recentlyo No Do you feel uncomfortable with anyone in your familyo No Has anyone forced you do things that you didnot want to doo No Electronic Signature(s) Signed: 04/26/2019 3:30:44 PM By: Army Melia Entered By: Army Melia on 04/26/2019 09:30:23 Studer, Ripley S. ([id number]) -------------------------------------------------------------------------------- Activities of Daily Living Details Patient Name: Housewright, Randell S. Date of Service: 04/26/2019 9:00 AM Medical Record Number: [id number] Patient Account Number: [id number] Date of Birth/Sex: October 24, 1951 (68 y.o. M) Treating RN: Army Melia Primary Care Navdeep Halt: Salome Holmes Other Clinician: Referring Izzabelle Bouley: Loney Hering Treating Kaylea Mounsey/Extender: Melburn Hake, HOYT Weeks in Treatment: 0 Activities of Daily Living Items Answer Activities of Daily Living (Please select one for each item) Drive Automobile Completely Able Take Medications Completely Able Use Telephone Completely Able Care for Appearance Completely Able Use Toilet Completely Able Bath / Shower Completely Able Dress Self Completely Able Feed Self Completely Able Walk Completely Able Get In / Out Bed Completely Able Housework Completely Able Prepare Meals Completely Coldiron for Self Completely Able Electronic Signature(s) Signed: 04/26/2019 3:30:44 PM By: Army Melia Entered By: Army Melia on 04/26/2019 09:30:36 Dunivan, Saifullah S. ([id number]) -------------------------------------------------------------------------------- Education Screening Details Patient Name: [name], Spyridon S. Date of Service: 04/26/2019 9:00 AM Medical Record Number: [id number] Patient Account Number: [id number] Date of Birth/Sex: 1951-01-04 (68 y.o. M) Treating RN: Army Melia Primary Care Laterrian Hevener: Salome Holmes Other Clinician: Referring Victory Strollo: Loney Hering Treating Alycea Segoviano/Extender: Sharalyn Ink in Treatment: 0 Primary Learner Assessed: Patient Learning Preferences/Education Level/Primary Language Learning Preference: Explanation Highest Education Level: High School Preferred Language: English Cognitive Barrier Language Barrier: No Translator Needed: No Memory Deficit: No Emotional Barrier: No Cultural/Religious Beliefs Affecting Medical Care: No Physical Barrier Impaired Vision: No Impaired Hearing: No Decreased Hand dexterity: No Knowledge/Comprehension Knowledge Level: High Comprehension Level: High Ability to understand written High instructions: Ability to understand verbal High instructions: Motivation Anxiety Level: Calm Cooperation: Cooperative Education Importance: Acknowledges Need Interest in Health Problems: Asks Questions Perception: Coherent Willingness to Engage in Self- High Management Activities: Readiness to Engage in Self- High Management Activities: Electronic Signature(s) Signed: 04/26/2019 3:30:44 PM By: Army Melia Entered By: Army Melia on 04/26/2019 09:31:16 Kraft, Jamell S. ([id number]) -------------------------------------------------------------------------------- Fall Risk Assessment Details Patient Name: Lewing, Keanon S. Date of Service: 04/26/2019 9:00 AM Medical Record Number: [id number] Patient Account Number: [id number] Date of Birth/Sex: 11/24/1950 (68 y.o. M) Treating RN: Army Melia Primary Care Arnav Cregg: Salome Holmes Other Clinician: Referring Burnard Enis: Loney Hering Treating Charlee Whitebread/Extender: Melburn Hake, HOYT Weeks in Treatment: 0 Fall Risk Assessment Items Have you had 2 or more falls in the last 12 monthso 0 No Have you had any fall that resulted in injury in the last 12 monthso 0 No FALLS RISK SCREEN History of falling - immediate or within 3 months 0 No Secondary diagnosis (Do you have 2 or more medical diagnoseso) 0 No Ambulatory aid None/bed rest/wheelchair/nurse 0 No Crutches/cane/walker 0 No Furniture 0 No Intravenous therapy Access/Saline/Heparin Lock 0 No Gait/Transferring Normal/ bed rest/ wheelchair 0 No Weak (short  steps with or without shuffle, stooped but able to lift head while 0 No walking, may seek support from furniture) Impaired (short steps with shuffle, may have difficulty arising from chair, head 0 No down, impaired balance) Mental Status Oriented to own ability 0 No Electronic Signature(s) Signed: 04/26/2019 3:30:44 PM By: Army Melia Entered By: Army Melia on 04/26/2019 09:31:29 Estrella, Kervin S. ([id number]) -------------------------------------------------------------------------------- Foot Assessment Details Patient Name: Clinkscale, Davante S. Date of Service: 04/26/2019 9:00 AM Medical Record Number: [id number] Patient Account Number: [id number] Date of Birth/Sex: 1951/07/15 (68 y.o. M) Treating RN: Army Melia Primary Care Pedro Whiters: Salome Holmes Other Clinician: Referring Fredia Chittenden: Loney Hering Treating Kamare Caspers/Extender: Melburn Hake, HOYT Weeks in Treatment: 0 Foot Assessment Items Site Locations + = Sensation present, - = Sensation absent, C = Callus, U = Ulcer R = Redness, W = Warmth, M = Maceration, PU = Pre-ulcerative lesion F = Fissure, S = Swelling, D = Dryness Assessment Right: Left: Other Deformity: No No Prior Foot Ulcer: No No Prior Amputation: No No Charcot Joint: No No Ambulatory  Status: Gait: Electronic Signature(s) Signed: 04/26/2019 3:30:44 PM By: Army Melia Entered By: Army Melia on 04/26/2019 09:37:56 Hostler, Tiny S. ([id number]) -------------------------------------------------------------------------------- Nutrition Risk Screening Details Patient Name: Llewellyn, Cian S. Date of Service: 04/26/2019 9:00 AM Medical Record Number: [id number] Patient Account Number: [id number] Date of Birth/Sex: 02-10-1951 (68 y.o. M) Treating RN: Army Melia Primary Care Lesley Atkin: Salome Holmes Other Clinician: Referring Cing Lane: Loney Hering Treating Nayelly Laughman/Extender: Melburn Hake, HOYT Weeks in Treatment: 0 Height (in): 68 Weight (lbs): 275 Body Mass Index (BMI): 41.8 Nutrition Risk Screening Items Score Screening NUTRITION RISK SCREEN: I have an illness or condition that made me change the kind and/or amount of 0 No food I eat I eat fewer than two meals per day 0 No I eat few fruits and vegetables, or milk products 0 No I have three or more drinks of beer, liquor or wine almost every day 0 No I have tooth or mouth problems that make it hard for me to eat 0 No I don't always have enough money to buy the food I need 0 No I eat alone most of the time 0 No I take three or more different prescribed or over-the-counter drugs a day 0 No Without wanting to, I have lost or gained 10 pounds in the last six months 0 No I am not always physically able to shop, cook and/or feed myself 0 No Nutrition Protocols Good Risk Protocol 0 No interventions needed Moderate Risk Protocol High Risk Proctocol Risk Level: Good Risk Score: 0 Electronic Signature(s) Signed: 04/26/2019 3:30:44 PM By: Army Melia Entered By: Army Melia on 04/26/2019 09:31:42

## 2019-04-26 NOTE — Progress Notes (Addendum)
Benjamin, [name] ([id number]) Visit Report for 04/26/2019 Chief Complaint Document Details Patient Name: Benjamin [name], Benjamin [name]. Date of Service: 04/26/2019 9:00 AM Medical Record Number: [id number] Patient Account Number: [id number] Date of Birth/Sex: [birth year] (68 y.o. M) Treating RN: Benjamin [name] Primary Care Provider: Salome [name] Other Clinician: Referring Provider: Loney [name] Treating Provider/Extender: Benjamin [name], Benjamin [name] in Treatment: 0 Information Obtained from: Patient Chief Complaint Left leg ulcer Electronic Signature(s) Signed: 04/26/2019 4:26:55 PM By: Benjamin Keeler PA-C Entered By: Benjamin [name] on 04/26/2019 09:56:38 [name], [name] ([id number]) -------------------------------------------------------------------------------- Debridement Details Patient Name: Benjamin [name], [name] Date of Service: 04/26/2019 9:00 AM Medical Record Number: [id number] Patient Account Number: [id number] Date of Birth/Sex: Jul 25, 1951 (68 y.o. M) Treating RN: Benjamin [name] Primary Care Provider: Salome [name] Other Clinician: Referring Provider: Loney [name] Treating Provider/Extender: Benjamin [name], Benjamin [name] in Treatment: 0 Debridement Performed for Wound #1 Left,Anterior Lower Leg Assessment: Performed By: Physician STONE III, Benjamin E., PA-C Debridement Type: Debridement Severity of Tissue Pre Fat layer exposed Debridement: Level of Consciousness (Pre- Awake and Alert procedure): Pre-procedure Verification/Time Yes - 10:02 Out Taken: Start Time: 10:03 Pain Control: Lidocaine Total Area Debrided (L x W): 3 (cm) x 1.5 (cm) = 4.5 (cm) Tissue and other material Non-Viable, Blood Clots, Muscle, Slough, Subcutaneous, Slough debrided: Level: Skin/Subcutaneous Tissue/Muscle Debridement Description: Excisional Instrument: Curette Bleeding: Minimum Hemostasis Achieved: Pressure End Time: 10:08 Procedural Pain: 0 Post Procedural Pain: 0 Response to Treatment:  Procedure was tolerated well Level of Consciousness Awake and Alert (Post-procedure): Post Debridement Measurements of Total Wound Length: (cm) 3 Width: (cm) 1.5 Depth: (cm) 0.3 Volume: (cm) 1.06 Character of Wound/Ulcer Post Debridement: Stable Severity of Tissue Post Debridement: Fat layer exposed Post Procedure Diagnosis Same as Pre-procedure Electronic Signature(s) Signed: 04/27/2019 8:12:00 AM By: Benjamin [name] Signed: 04/28/2019 12:15:24 AM By: Benjamin Keeler PA-C Previous Signature: 04/26/2019 3:30:44 PM Version By: Army Melia Previous Signature: 04/26/2019 4:26:55 PM Version By: Benjamin Keeler PA-C Entered By: Benjamin [name] on 04/27/2019 08:12:00 [name], [name] ([id number]) -------------------------------------------------------------------------------- HPI Details Patient Name: [name], [name] Date of Service: 04/26/2019 9:00 AM Medical Record Number: [id number] Patient Account Number: [id number] Date of Birth/Sex: [birth year] (68 y.o. M) Treating RN: Benjamin [name] Primary Care Provider: Salome [name] Other Clinician: Referring Provider: Loney [name] Treating Provider/Extender: Benjamin [name], Benjamin [name] in Treatment: 0 History of Present Illness HPI Description: 04/26/19 on evaluation today patient presents for initial evaluation our clinic concerning an issue that he has been having with the wound on the left anterior lower extremity which occurred in April 2020 as a result of a traumatic injury when he was working on a tree stump in his yard He states that the tree stump flew back and hit him in the shin causing a significant amount of bruising and abrasion even through his jeans. He did end up in the hospital for time where he was given antibiotics fortunately he's done with those at this point although he still appears to have a significant wound that drains quite a bit especially if he pushes at the 12 o'clock location he will have a significant amount of  yellow drainage not really purulent noted. He is not have any significant pain at this time which is good news. He does state that he occasionally has gotten tissue as well as blood clots out of the wound. Electronic Signature(s) Signed: 04/26/2019 4:26:55 PM By: Benjamin Keeler PA-C Entered By: Benjamin [name] on 04/26/2019 16:23:55 [name], [name] ([id number]) -------------------------------------------------------------------------------- Physical Exam  Details Patient Name: [name], Benjamin. Date of Service: 04/26/2019 9:00 AM Medical Record Number: [id number] Patient Account Number: [id number] Date of Birth/Sex: February 22, 1951 (68 y.o. M) Treating RN: Benjamin [name] Primary Care Provider: Salome [name] Other Clinician: Referring Provider: Loney [name] Treating Provider/Extender: Benjamin [name], Benjamin [name] in Treatment: 0 Constitutional patient is hypertensive.. pulse regular and within target range for patient.Marland Kitchen respirations regular, non-labored and within target range for patient.Marland Kitchen temperature within target range for patient.. Well-nourished and well-hydrated in no acute distress. Eyes conjunctiva clear no eyelid edema noted. pupils equal round and reactive to light and accommodation. Ears, Nose, Mouth, and Throat no gross abnormality of ear auricles or external auditory canals. normal hearing noted during conversation. mucus membranes moist. Respiratory normal breathing without difficulty. clear to auscultation bilaterally. Cardiovascular regular rate and rhythm with normal S1, S2. 2+ dorsalis pedis/posterior tibialis pulses. 1+ pitting edema of the bilateral lower extremities. Gastrointestinal (GI) soft, non-tender, non-distended, +BS. no ventral hernia noted. Musculoskeletal normal gait and posture. no significant deformity or arthritic changes, no loss or range of motion, no clubbing. Psychiatric this patient is able to make decisions and demonstrates good insight into  disease process. Alert and Oriented x 3. pleasant and cooperative. Notes Upon inspection today patient actually has a fairly significant wound over the left anterior lower extremity upon evaluation today. He doesn't have any pain noted which is good news. With that being said there was a lot of drainage from 12 o'clock location and actually when I use the skinny probe to go into the small opening he had a region of undermining from 12 to 5 which was at the deepest part roughly 8 cm deep. Subsequently I ended up needing to perform sharp debridement to clear way necrotic tissue as well and this opened up the wound quite significantly along with 12 to 5 o'clock location although I was able to clean this out completely and the wound that appeared to be doing much better post debridement. The patient tolerated all this with no pain. Electronic Signature(s) Signed: 04/26/2019 4:26:55 PM By: Benjamin Keeler PA-C Entered By: Benjamin [name] on 04/26/2019 16:25:02 [name], Benjamin Loron ([id number]) -------------------------------------------------------------------------------- Physician Orders Details Patient Name: [name], [name] Date of Service: 04/26/2019 9:00 AM Medical Record Number: [id number] Patient Account Number: [id number] Date of Birth/Sex: [birth year] (68 y.o. M) Treating RN: Army Melia Primary Care Provider: Salome [name] Other Clinician: Referring Provider: Loney [name] Treating Provider/Extender: Benjamin [name], Benjamin [name] in Treatment: 0 Verbal / Phone Orders: No Diagnosis Coding ICD-10 Coding Code Description I87.2 Venous insufficiency (chronic) (peripheral) L97.822 Non-pressure chronic ulcer of other part of left lower leg with fat layer exposed Wound Cleansing Wound #1 Left,Anterior Lower Leg o Clean wound with Normal Saline. Primary Wound Dressing Wound #1 Left,Anterior Lower Leg o Silver Alginate o XtraSorb Secondary Dressing Wound #1 Left,Anterior Lower  Leg o Conform/Kerlix o Other - double layer tubigrip Dressing Change Frequency Wound #1 Left,Anterior Lower Leg o Change Dressing Monday, Wednesday, Friday Follow-up Appointments Wound #1 Left,Anterior Lower Leg o Return Appointment in 1 week. Patient Medications Allergies: no known allergies Notifications Medication Indication Start End Keflex 04/26/2019 DOSE 1 - oral 500 mg capsule - 1 capsule oral taken 3 times a day for 10 days Electronic Signature(s) Signed: 04/26/2019 10:44:26 AM By: Benjamin Keeler PA-C Entered By: Benjamin [name] on 04/26/2019 10:44:25 Kerrigan, Kaz S. ([id number]) -------------------------------------------------------------------------------- Problem List Details Patient Name: Pendell, Yeiren S. Date of Service: 04/26/2019 9:00 AM Medical Record Number: [id number] Patient Account Number:  [id number] Date of Birth/Sex: 06/18/1951 (68 y.o. M) Treating RN: Benjamin [name] Primary Care Provider: Salome [name] Other Clinician: Referring Provider: Loney [name] Treating Provider/Extender: Benjamin [name], Benjamin [name] in Treatment: 0 Active Problems ICD-10 Evaluated Encounter Code Description Active Date Today Diagnosis I87.2 Venous insufficiency (chronic) (peripheral) 04/26/2019 No Yes L97.822 Non-pressure chronic ulcer of other part of left lower leg with 04/26/2019 No Yes fat layer exposed Inactive Problems Resolved Problems Electronic Signature(s) Signed: 04/26/2019 4:26:55 PM By: Benjamin Keeler PA-C Entered By: Benjamin [name] on 04/26/2019 09:56:14 Laba, Jiovani S. ([id number]) -------------------------------------------------------------------------------- Progress Note Details Patient Name: [name], Kumar S. Date of Service: 04/26/2019 9:00 AM Medical Record Number: [id number] Patient Account Number: [id number] Date of Birth/Sex: 19-Apr-1951 (68 y.o. M) Treating RN: Benjamin [name] Primary Care Provider: Salome [name] Other  Clinician: Referring Provider: Loney [name] Treating Provider/Extender: Benjamin [name], Benjamin [name] in Treatment: 0 Subjective Chief Complaint Information obtained from Patient Left leg ulcer History of Present Illness (HPI) 04/26/19 on evaluation today patient presents for initial evaluation our clinic concerning an issue that he has been having with the wound on the left anterior lower extremity which occurred in April 2020 as a result of a traumatic injury when he was working on a tree stump in his yard He states that the tree stump flew back and hit him in the shin causing a significant amount of bruising and abrasion even through his jeans. He did end up in the hospital for time where he was given antibiotics fortunately he's done with those at this point although he still appears to have a significant wound that drains quite a bit especially if he pushes at the 12 o'clock location he will have a significant amount of yellow drainage not really purulent noted. He is not have any significant pain at this time which is good news. He does state that he occasionally has gotten tissue as well as blood clots out of the wound. Patient History Information obtained from Patient. Allergies no known allergies Family History Cancer - Maternal Grandparents,Paternal Grandparents, No family history of Diabetes, Heart Disease, Hereditary Spherocytosis, Hypertension, Kidney Disease, Lung Disease, Seizures, Stroke, Thyroid Problems, Tuberculosis. Social History Never smoker, Marital Status - Married, Alcohol Use - Never, Drug Use - No History, Caffeine Use - Daily - coffee. Medical History Oncologic Denies history of Received Chemotherapy, Received Radiation Hospitalization/Surgery History - [facility] 04/07/2019. Review of Systems (ROS) Eyes Denies complaints or symptoms of Dry Eyes, Vision Changes, Glasses / Contacts. Ear/Nose/Mouth/Throat Denies complaints or symptoms of Difficult clearing  ears, Sinusitis. Hematologic/Lymphatic Denies complaints or symptoms of Bleeding / Clotting Disorders, Human Immunodeficiency Virus. Respiratory Denies complaints or symptoms of Chronic or frequent coughs, Shortness of Breath. Cardiovascular [place], [place] ([id number]) Denies complaints or symptoms of Chest pain, LE edema. Gastrointestinal Denies complaints or symptoms of Frequent diarrhea, Nausea, Vomiting. Endocrine Denies complaints or symptoms of Hepatitis, Thyroid disease, Polydypsia (Excessive Thirst). Genitourinary Denies complaints or symptoms of Kidney failure/ Dialysis, Incontinence/dribbling. Immunological Denies complaints or symptoms of Hives, Itching. Integumentary (Skin) Denies complaints or symptoms of Wounds, Bleeding or bruising tendency, Breakdown, Swelling. Musculoskeletal Denies complaints or symptoms of Muscle Pain, Muscle Weakness. Neurologic Denies complaints or symptoms of Numbness/parasthesias, Focal/Weakness. Psychiatric Denies complaints or symptoms of Anxiety, Claustrophobia. Objective Constitutional patient is hypertensive.. pulse regular and within target range for patient.Marland Kitchen respirations regular, non-labored and within target range for patient.Marland Kitchen temperature within target range for patient.. Well-nourished and well-hydrated in no acute distress. Vitals Time Taken: 9:25 AM, Height: 68 in, Source:  Stated, Weight: 275 lbs, Source: Stated, BMI: 41.8, Temperature: 98.5 F, Pulse: 78 bpm, Respiratory Rate: 16 breaths/min, Blood Pressure: 165/81 mmHg. Eyes conjunctiva clear no eyelid edema noted. pupils equal round and reactive to light and accommodation. Ears, Nose, Mouth, and Throat no gross abnormality of ear auricles or external auditory canals. normal hearing noted during conversation. mucus membranes moist. Respiratory normal breathing without difficulty. clear to auscultation bilaterally. Cardiovascular regular rate and rhythm with normal S1,  S2. 2+ dorsalis pedis/posterior tibialis pulses. 1+ pitting edema of the bilateral lower extremities. Gastrointestinal (GI) soft, non-tender, non-distended, +BS. no ventral hernia noted. Musculoskeletal normal gait and posture. no significant deformity or arthritic changes, no loss or range of motion, no clubbing. Psychiatric this patient is able to make decisions and demonstrates good insight into disease process. Alert and Oriented x 3. pleasant and cooperative. JASUN, Benjamin [name] ([id number]) General Notes: Upon inspection today patient actually has a fairly significant wound over the left anterior lower extremity upon evaluation today. He doesn't have any pain noted which is good news. With that being said there was a lot of drainage from 12 o'clock location and actually when I use the skinny probe to go into the small opening he had a region of undermining from 12 to 5 which was at the deepest part roughly 8 cm deep. Subsequently I ended up needing to perform sharp debridement to clear way necrotic tissue as well and this opened up the wound quite significantly along with 12 to 5 o'clock location although I was able to clean this out completely and the wound that appeared to be doing much better post debridement. The patient tolerated all this with no pain. Integumentary (Hair, Skin) Wound #1 status is Open. Original cause of wound was Trauma. The wound is located on the Left,Anterior Lower Leg. The wound measures 3cm length x 1.5cm width x 0.2cm depth; 3.534cm^2 area and 0.707cm^3 volume. There is Fat Layer (Subcutaneous Tissue) Exposed exposed. There is no tunneling noted, however, there is undermining starting at 12:00 and ending at 5:00 with a maximum distance of 8.7cm. There is a large amount of serous drainage noted. The wound margin is flat and intact. There is medium (34-66%) red granulation within the wound bed. There is a medium (34-66%) amount of necrotic tissue within the  wound bed including Adherent Slough. Assessment Active Problems ICD-10 Venous insufficiency (chronic) (peripheral) Non-pressure chronic ulcer of other part of left lower leg with fat layer exposed Procedures Wound #1 Pre-procedure diagnosis of Wound #1 is a Trauma, Other located on the Left,Anterior Lower Leg . There was a Excisional Skin/Subcutaneous Tissue/Muscle Debridement with a total area of 4.5 sq cm performed by STONE III, Benjamin E., PA-C. With the following instrument(s): Curette to remove Non-Viable tissue/material. Material removed includes Blood Clots, Muscle, Subcutaneous Tissue, and Slough after achieving pain control using Lidocaine. A time out was conducted at 10:02, prior to the start of the procedure. A Minimum amount of bleeding was controlled with Pressure. The procedure was tolerated well. Post Debridement Measurements: 3cm length x 1.5cm width x 0.3cm depth; 1.06cm^3 volume. Character of Wound/Ulcer Post Debridement is stable. Post procedure Diagnosis Wound #1: Same as Pre-Procedure Plan Wound Cleansing: Wound #1 Left,Anterior Lower Leg: Clean wound with Normal Saline. Primary Wound Dressing: Wound #1 Left,Anterior Lower Leg: RASHAUN, WICHERT ([id number]) Silver Alginate XtraSorb Secondary Dressing: Wound #1 Left,Anterior Lower Leg: Conform/Kerlix Other - double layer tubigrip Dressing Change Frequency: Wound #1 Left,Anterior Lower Leg: Change Dressing Monday, Wednesday, Friday Follow-up Appointments:  Wound #1 Left,Anterior Lower Leg: Return Appointment in 1 week. The following medication(s) was prescribed: Keflex oral 500 mg capsule 1 1 capsule oral taken 3 times a day for 10 days starting 04/26/2019 My suggestion at this point is gonna be that we go ahead and initiate the above wound care measures for the next week and the patient is in agreement the plan. This is gonna include using Tubigrip as well as packing the region of undermining and depth of the  wound with a silver alginate dressing. He's in agreement this plan and we did bring his wife back today in order to show her how to perform this at home. Overall I've been very pleased with how things seem to be progressing all things considering. I think now we have this cleaned out and are gonna be packing this appropriately this meeting progress much more rapidly. With that being said if it does not seem to be doing as well as I would like I think the Wound VAC is a possibility although he is very active and I'm not sure that he will tolerate and or like the Wound VAC at all. We will see were things stand at follow-up. Please see above for specific wound care orders. We will see patient for re-evaluation in 1 week(s) here in the clinic. If anything worsens or changes patient will contact our office for additional recommendations. Electronic Signature(s) Signed: 04/26/2019 4:26:55 PM By: Benjamin Keeler PA-C Entered By: Benjamin [name] on 04/26/2019 16:26:14 [name], [name] ([id number]) -------------------------------------------------------------------------------- ROS/PFSH Details Patient Name: [name], [name] Date of Service: 04/26/2019 9:00 AM Medical Record Number: [id number] Patient Account Number: [id number] Date of Birth/Sex: [birth year] (68 y.o. M) Treating RN: Army Melia Primary Care Provider: Salome [name] Other Clinician: Referring Provider: Loney [name] Treating Provider/Extender: Benjamin [name], Benjamin [name] in Treatment: 0 Information Obtained From Patient Eyes Complaints and Symptoms: Negative for: Dry Eyes; Vision Changes; Glasses / Contacts Ear/Nose/Mouth/Throat Complaints and Symptoms: Negative for: Difficult clearing ears; Sinusitis Hematologic/Lymphatic Complaints and Symptoms: Negative for: Bleeding / Clotting Disorders; Human Immunodeficiency Virus Respiratory Complaints and Symptoms: Negative for: Chronic or frequent coughs; Shortness of  Breath Cardiovascular Complaints and Symptoms: Negative for: Chest pain; LE edema Gastrointestinal Complaints and Symptoms: Negative for: Frequent diarrhea; Nausea; Vomiting Endocrine Complaints and Symptoms: Negative for: Hepatitis; Thyroid disease; Polydypsia (Excessive Thirst) Genitourinary Complaints and Symptoms: Negative for: Kidney failure/ Dialysis; Incontinence/dribbling Immunological Complaints and Symptoms: Negative for: Hives; Itching Integumentary (Skin) [name], [name] ([id number]) Complaints and Symptoms: Negative for: Wounds; Bleeding or bruising tendency; Breakdown; Swelling Musculoskeletal Complaints and Symptoms: Negative for: Muscle Pain; Muscle Weakness Neurologic Complaints and Symptoms: Negative for: Numbness/parasthesias; Focal/Weakness Psychiatric Complaints and Symptoms: Negative for: Anxiety; Claustrophobia Oncologic Medical History: Negative for: Received Chemotherapy; Received Radiation Immunizations Pneumococcal Vaccine: Received Pneumococcal Vaccination: Yes Tetanus Vaccine: Last tetanus shot: 04/22/2019 Implantable Devices None Hospitalization / Surgery History Type of Hospitalization/Surgery [facility] 04/07/2019 Family and Social History Cancer: Yes - Maternal Grandparents,Paternal Grandparents; Diabetes: No; Heart Disease: No; Hereditary Spherocytosis: No; Hypertension: No; Kidney Disease: No; Lung Disease: No; Seizures: No; Stroke: No; Thyroid Problems: No; Tuberculosis: No; Never smoker; Marital Status - Married; Alcohol Use: Never; Drug Use: No History; Caffeine Use: Daily - coffee; Financial Concerns: No; Food, Clothing or Shelter Needs: No; Support System Lacking: No; Transportation Concerns: No Electronic Signature(s) Signed: 04/26/2019 3:30:44 PM By: Army Melia Signed: 04/26/2019 4:26:55 PM By: Benjamin Keeler PA-C Entered By: Army Melia on 04/26/2019 09:30:12 Concepcion, [place].  ([id number]) -------------------------------------------------------------------------------- SuperBill Details  Patient Name: AMOL, DOMANSKI. Date of Service: 04/26/2019 Medical Record Number: [id number] Patient Account Number: [id number] Date of Birth/Sex: [birth year] (68 y.o. M) Treating RN: Benjamin [name] Primary Care Provider: Salome [name] Other Clinician: Referring Provider: Loney [name] Treating Provider/Extender: Benjamin [name], Benjamin [name] in Treatment: 0 Diagnosis Coding ICD-10 Codes Code Description I87.2 Venous insufficiency (chronic) (peripheral) L97.822 Non-pressure chronic ulcer of other part of left lower leg with fat layer exposed Facility Procedures CPT4 Code Description: [id number] 99213 - WOUND CARE VISIT-LEV 3 EST PT Modifier: Quantity: 1 CPT4 Code Description: [id number] 31594 - DEB MUSC/FASCIA 20 SQ CM/< ICD-10 Diagnosis Description L97.822 Non-pressure chronic ulcer of other part of left lower leg with Modifier: fat layer expos Quantity: 1 ed Physician Procedures CPT4 Code Description: [id number] WC PHYS LEVEL 3 o NEW PT ICD-10 Diagnosis Description I87.2 Venous insufficiency (chronic) (peripheral) L97.822 Non-pressure chronic ulcer of other part of left lower leg with fat Modifier: 25 layer expos Quantity: 1 ed CPT4 Code Description: [id number] 11043 - WC PHYS DEBR MUSCLE/FASCIA 20 SQ CM ICD-10 Diagnosis Description L97.822 Non-pressure chronic ulcer of other part of left lower leg with fat Modifier: layer expos Quantity: 1 ed Electronic Signature(s) Signed: 04/27/2019 8:13:06 AM By: Benjamin [name] Signed: 04/28/2019 12:15:24 AM By: Benjamin Keeler PA-C Previous Signature: 04/26/2019 4:26:55 PM Version By: Benjamin Keeler PA-C Entered By: Benjamin [name] on 04/27/2019 08:13:06

## 2019-04-26 NOTE — Progress Notes (Addendum)
DOY, TAAFFE ([id number]) Visit Report for 04/26/2019 Allergy List Details Patient Name: Benjamin [name], Benjamin [name]. Date of Service: 04/26/2019 9:00 AM Medical Record Number: [id number] Patient Account Number: [id number] Date of Birth/Sex: 08-Jul-1951 (68 y.o. M) Treating RN: Army Melia Primary Care Cannie Muckle: Salome Holmes Other Clinician: Referring Jasalyn Frysinger: Loney Hering Treating Leita Lindbloom/Extender: STONE III, HOYT Weeks in Treatment: 0 Allergies Active Allergies No Known Drug Allergies Allergy Notes Electronic Signature(s) Signed: 04/28/2019 9:46:04 AM By: Montey Hora Previous Signature: 04/26/2019 3:30:44 PM Version By: Army Melia Entered By: Montey Hora on 04/28/2019 09:46:04 Brickman, Dandre S. ([id number]) -------------------------------------------------------------------------------- Arrival Information Details Patient Name: [name], [name] Date of Service: 04/26/2019 9:00 AM Medical Record Number: [id number] Patient Account Number: [id number] Date of Birth/Sex: [birth year] (68 y.o. M) Treating RN: Army Melia Primary Care Renette Hsu: Salome Holmes Other Clinician: Referring Miliyah Luper: Loney Hering Treating Nyema Hachey/Extender: Melburn Hake, HOYT Weeks in Treatment: 0 Visit Information Patient Arrived: Ambulatory Arrival Time: 09:24 Accompanied By: wife Transfer Assistance: None Patient Identification Verified: Yes Electronic Signature(s) Signed: 04/26/2019 3:30:44 PM By: Army Melia Entered By: Army Melia on 04/26/2019 09:24:37 Macioce, Carlus S. ([id number]) -------------------------------------------------------------------------------- Clinic Level of Care Assessment Details Patient Name: [name], [name] Date of Service: 04/26/2019 9:00 AM Medical Record Number: [id number] Patient Account Number: [id number] Date of Birth/Sex: 02-10-1951 (68 y.o. M) Treating RN: Army Melia Primary Care Evanee Lubrano: Salome Holmes Other Clinician: Referring Romney Compean:  Loney Hering Treating Hadasah Brugger/Extender: Melburn Hake, HOYT Weeks in Treatment: 0 Clinic Level of Care Assessment Items TOOL 1 Quantity Score X - Use when EandM and Procedure is performed on INITIAL visit 1 0 ASSESSMENTS - Nursing Assessment / Reassessment X - General Physical Exam (combine w/ comprehensive assessment (listed just below) when 1 20 performed on new pt. evals) X- 1 25 Comprehensive Assessment (HX, ROS, Risk Assessments, Wounds Hx, etc.) ASSESSMENTS - Wound and Skin Assessment / Reassessment []  - Dermatologic / Skin Assessment (not related to wound area) 0 ASSESSMENTS - Ostomy and/or Continence Assessment and Care []  - Incontinence Assessment and Management 0 []  - 0 Ostomy Care Assessment and Management (repouching, etc.) PROCESS - Coordination of Care X - Simple Patient / Family Education for ongoing care 1 15 []  - 0 Complex (extensive) Patient / Family Education for ongoing care X- 1 10 Staff obtains Programmer, systems, Records, Test Results / Process Orders []  - 0 Staff telephones HHA, Nursing Homes / Clarify orders / etc []  - 0 Routine Transfer to another Facility (non-emergent condition) []  - 0 Routine Hospital Admission (non-emergent condition) X- 1 15 New Admissions / Biomedical engineer / Ordering NPWT, Apligraf, etc. []  - 0 Emergency Hospital Admission (emergent condition) PROCESS - Special Needs []  - Pediatric / Minor Patient Management 0 []  - 0 Isolation Patient Management []  - 0 Hearing / Language / Visual special needs []  - 0 Assessment of Community assistance (transportation, D/C planning, etc.) []  - 0 Additional assistance / Altered mentation []  - 0 Support Surface(s) Assessment (bed, cushion, seat, etc.) Keenum, Bud S. ([id number]) INTERVENTIONS - Miscellaneous []  - External ear exam 0 []  - 0 Patient Transfer (multiple staff / Civil Service fast streamer / Similar devices) []  - 0 Simple Staple / Suture removal (25 or less) []  - 0 Complex Staple /  Suture removal (26 or more) []  - 0 Hypo/Hyperglycemic Management (do not check if billed separately) X- 1 15 Ankle / Brachial Index (ABI) - do not check if billed separately Has the patient been seen at the hospital within the last three years: Yes Total Score: 100 Level Of  Care: New/Established - Level 3 Electronic Signature(s) Signed: 04/27/2019 1:16:30 PM By: Montey Hora Previous Signature: 04/26/2019 3:30:44 PM Version By: Army Melia Entered By: Montey Hora on 04/27/2019 08:12:54 Benjamin [name] ([id number]) -------------------------------------------------------------------------------- Encounter Discharge Information Details Patient Name: [name], [name] Date of Service: 04/26/2019 9:00 AM Medical Record Number: [id number] Patient Account Number: [id number] Date of Birth/Sex: [birth year] (68 y.o. M) Treating RN: Army Melia Primary Care Bryonna Sundby: Salome Holmes Other Clinician: Referring Javelle Donigan: Loney Hering Treating Casanova Schurman/Extender: Melburn Hake, HOYT Weeks in Treatment: 0 Encounter Discharge Information Items Post Procedure Vitals Discharge Condition: Stable Temperature (F): 98.5 Ambulatory Status: Ambulatory Pulse (bpm): 78 Discharge Destination: Home Respiratory Rate (breaths/min): 16 Transportation: Other Blood Pressure (mmHg): 165/81 Accompanied By: wife Schedule Follow-up Appointment: Yes Clinical Summary of Care: Electronic Signature(s) Signed: 04/26/2019 3:30:44 PM By: Army Melia Entered By: Army Melia on 04/26/2019 10:18:33 Lortie, Michell S. ([id number]) -------------------------------------------------------------------------------- Lower Extremity Assessment Details Patient Name: [name], [name] Date of Service: 04/26/2019 9:00 AM Medical Record Number: [id number] Patient Account Number: [id number] Date of Birth/Sex: 08/11/1951 (68 y.o. M) Treating RN: Army Melia Primary Care Keziyah Kneale: Salome Holmes Other Clinician: Referring  Vera Furniss: Loney Hering Treating Rocklyn Mayberry/Extender: Melburn Hake, HOYT Weeks in Treatment: 0 Edema Assessment Assessed: [Left: No] [Right: No] Edema: [Left: Ye] [Right: s] Calf Left: Right: Point of Measurement: 26 cm From Medial Instep 40 cm cm Ankle Left: Right: Point of Measurement: 10 cm From Medial Instep 23 cm cm Vascular Assessment Pulses: Dorsalis Pedis Palpable: [Left:Yes] Doppler Audible: [Left:Yes] Posterior Tibial Doppler Audible: [Left:Yes] Blood Pressure: Brachial: [Left:140] Dorsalis Pedis: 185 Ankle: Posterior Tibial: 210 Ankle Brachial Index: [Left:1.50] Electronic Signature(s) Signed: 04/26/2019 3:30:44 PM By: Army Melia Entered By: Army Melia on 04/26/2019 09:50:53 Weissinger, Johngabriel S. ([id number]) -------------------------------------------------------------------------------- Multi Wound Chart Details Patient Name: [name], [name] Date of Service: 04/26/2019 9:00 AM Medical Record Number: [id number] Patient Account Number: [id number] Date of Birth/Sex: Apr 01, 1951 (68 y.o. M) Treating RN: Army Melia Primary Care Conner Muegge: Salome Holmes Other Clinician: Referring Tino Ronan: Loney Hering Treating Ketrina Boateng/Extender: Melburn Hake, HOYT Weeks in Treatment: 0 Vital Signs Height(in): 68 Pulse(bpm): 78 Weight(lbs): 275 Blood Pressure(mmHg): 165/81 Body Mass Index(BMI): 42 Temperature(F): 98.5 Respiratory Rate 16 (breaths/min): Photos: [1:No Photos] [N/A:N/A] Wound Location: [1:Left Lower Leg - Anterior] [N/A:N/A] Wounding Event: [1:Trauma] [N/A:N/A] Primary Etiology: [1:Trauma, Other] [N/A:N/A] Date Acquired: [1:03/11/2019] [N/A:N/A] Weeks of Treatment: [1:0] [N/A:N/A] Wound Status: [1:Open] [N/A:N/A] Measurements L x W x D [1:3x1.5x0.2] [N/A:N/A] (cm) Area (cm) : [1:3.534] [N/A:N/A] Volume (cm) : [2:4.268] [N/A:N/A] Classification: [1:Partial Thickness] [N/A:N/A] Exudate Amount: [1:Large] [N/A:N/A] Exudate Type: [1:Serous]  [N/A:N/A] Exudate Color: [1:amber] [N/A:N/A] Wound Margin: [1:Flat and Intact] [N/A:N/A] Granulation Amount: [1:Medium (34-66%)] [N/A:N/A] Granulation Quality: [1:Red] [N/A:N/A] Necrotic Amount: [1:Medium (34-66%)] [N/A:N/A] Exposed Structures: [1:Fat Layer (Subcutaneous Tissue) Exposed: Yes Fascia: No Tendon: No Muscle: No Joint: No Bone: No Small (1-33%)] [N/A:N/A N/A] Treatment Notes Electronic Signature(s) Signed: 04/26/2019 3:30:44 PM By: Army Melia Entered By: Army Melia on 04/26/2019 10:01:29 Weinhold, Mae Chauncey [name] ([id number]) -------------------------------------------------------------------------------- [place] Details Patient Name: [name], [name] Date of Service: 04/26/2019 9:00 AM Medical Record Number: [id number] Patient Account Number: [id number] Date of Birth/Sex: June 04, 1951 (68 y.o. M) Treating RN: Army Melia Primary Care Tayo Maute: Salome Holmes Other Clinician: Referring Jolan Upchurch: Loney Hering Treating Tahesha Skeet/Extender: Melburn Hake, HOYT Weeks in Treatment: 0 Active Inactive Abuse / Safety / Falls / Self Care Management Nursing Diagnoses: Potential for falls Goals: Patient will remain injury free related to falls Date Initiated: 04/27/2019 Target Resolution Date: 07/24/2019 Goal Status: Active Interventions: Assess  fall risk on admission and as needed Notes: Orientation to the Wound Care Program Nursing Diagnoses: Knowledge deficit related to the wound healing center program Goals: Patient/caregiver will verbalize understanding of the [place] Program Date Initiated: 04/27/2019 Target Resolution Date: 07/24/2019 Goal Status: Active Interventions: Provide education on orientation to the wound center Notes: Pain, Acute or Chronic Nursing Diagnoses: Pain, acute or chronic: actual or potential Goals: Patient will verbalize adequate pain control and receive pain control interventions during procedures as needed Date  Initiated: 04/26/2019 Target Resolution Date: 05/28/2019 Goal Status: Active Interventions: Assess comfort goal upon admission [name], [name] ([id number]) Complete pain assessment as per visit requirements Notes: Wound/Skin Impairment Nursing Diagnoses: Impaired tissue integrity Goals: Ulcer/skin breakdown will have a volume reduction of 30% by week 4 Date Initiated: 04/26/2019 Target Resolution Date: 05/24/2019 Goal Status: Active Ulcer/skin breakdown will have a volume reduction of 50% by week 8 Date Initiated: 04/26/2019 Target Resolution Date: 06/21/2019 Goal Status: Active Interventions: Assess patient/caregiver ability to obtain necessary supplies Assess ulceration(s) every visit Notes: Electronic Signature(s) Signed: 04/27/2019 8:11:20 AM By: Montey Hora Signed: 04/28/2019 1:56:36 PM By: Army Melia Previous Signature: 04/26/2019 3:30:44 PM Version By: Army Melia Entered By: Montey Hora on 04/27/2019 08:11:19 [name], [name] ([id number]) -------------------------------------------------------------------------------- Pain Assessment Details Patient Name: [name], [name] Date of Service: 04/26/2019 9:00 AM Medical Record Number: [id number] Patient Account Number: [id number] Date of Birth/Sex: [birth year] (68 y.o. M) Treating RN: Army Melia Primary Care Daymen Hassebrock: Salome Holmes Other Clinician: Referring Arneshia Ade: Loney Hering Treating Jabin Tapp/Extender: Melburn Hake, HOYT Weeks in Treatment: 0 Active Problems Location of Pain Severity and Description of Pain Patient Has Paino No Site Locations Pain Management and Medication Current Pain Management: Electronic Signature(s) Signed: 04/26/2019 3:30:44 PM By: Army Melia Entered By: Army Melia on 04/26/2019 09:25:18 [name], [name] ([id number]) -------------------------------------------------------------------------------- Patient/Caregiver Education Details Patient Name: [name], Benjamin Loron. Date of  Service: 04/26/2019 9:00 AM Medical Record Number: [id number] Patient Account Number: [id number] Date of Birth/Gender: 01/29/1951 (68 y.o. M) Treating RN: Army Melia Primary Care Physician: Salome Holmes Other Clinician: Referring Physician: Loney Hering Treating Physician/Extender: Sharalyn Ink in Treatment: 0 Education Assessment Education Provided To: Patient Education Topics Provided Wound Debridement: Handouts: Wound Debridement Methods: Demonstration, Explain/Verbal Wound/Skin Impairment: Handouts: Caring for Your Ulcer Methods: Demonstration, Explain/Verbal Responses: State content correctly Electronic Signature(s) Signed: 04/26/2019 3:30:44 PM By: Army Melia Entered By: Army Melia on 04/26/2019 10:16:54 Liberman, Delan S. ([id number]) -------------------------------------------------------------------------------- Wound Assessment Details Patient Name: Rockholt, Guadalupe S. Date of Service: 04/26/2019 9:00 AM Medical Record Number: [id number] Patient Account Number: [id number] Date of Birth/Sex: June 03, 1951 (68 y.o. M) Treating RN: Army Melia Primary Care Cory Kitt: Salome Holmes Other Clinician: Referring Daris Aristizabal: Loney Hering Treating Shametra Cumberland/Extender: Melburn Hake, HOYT Weeks in Treatment: 0 Wound Status Wound Number: 1 Primary Etiology: Trauma, Other Wound Location: Left Lower Leg - Anterior Wound Status: Open Wounding Event: Trauma Date Acquired: 03/11/2019 Weeks Of Treatment: 0 Clustered Wound: No Photos Photo Uploaded By: Army Melia on 04/26/2019 11:59:16 Wound Measurements Length: (cm) 3 % Reduct Width: (cm) 1.5 % Reduct Depth: (cm) 0.2 Epitheli Area: (cm) 3.534 Tunneli Volume: (cm) 0.707 Undermi Start Endin Maxim ion in Area: 0% ion in Volume: 0% alization: Small (1-33%) ng: No ning: Yes ing Position (o'clock): 12 g Position (o'clock): 5 um Distance: (cm) 8.7 Wound Description Classification: Partial Thickness Foul  Odo Wound Margin: Flat and Intact Slough/F Exudate Amount: Large Exudate Type: Serous Exudate Color: amber r After Cleansing: No ibrino Yes Wound Bed Granulation Amount:  Medium (34-66%) Exposed Structure Granulation Quality: Red Fascia Exposed: No Necrotic Amount: Medium (34-66%) Fat Layer (Subcutaneous Tissue) Exposed: Yes Necrotic Quality: Adherent Slough Tendon Exposed: No Muscle Exposed: No Horine, Kimarion S. ([id number]) Joint Exposed: No Bone Exposed: No Electronic Signature(s) Signed: 04/26/2019 3:30:44 PM By: Army Melia Entered By: Army Melia on 04/26/2019 10:08:52 Alewine, Bryley S. ([id number]) -------------------------------------------------------------------------------- [place] Details Patient Name: Duzan, Amaan S. Date of Service: 04/26/2019 9:00 AM Medical Record Number: [id number] Patient Account Number: [id number] Date of Birth/Sex: [birth year] (68 y.o. M) Treating RN: Army Melia Primary Care Matti Minney: Salome Holmes Other Clinician: Referring Julius Boniface: Loney Hering Treating Merion Caton/Extender: Melburn Hake, HOYT Weeks in Treatment: 0 Vital Signs Time Taken: 09:25 Temperature (F): 98.5 Height (in): 68 Pulse (bpm): 78 Source: Stated Respiratory Rate (breaths/min): 16 Weight (lbs): 275 Blood Pressure (mmHg): 165/81 Source: Stated Reference Range: 80 - 120 mg / dl Body Mass Index (BMI): 41.8 Electronic Signature(s) Signed: 04/26/2019 3:30:44 PM By: Army Melia Entered By: Army Melia on 04/26/2019 09:25:48

## 2019-05-05 ENCOUNTER — Other Ambulatory Visit: Payer: Self-pay

## 2019-05-05 ENCOUNTER — Encounter: Payer: Medicare Other | Admitting: Internal Medicine

## 2019-05-05 DIAGNOSIS — L97822 Non-pressure chronic ulcer of other part of left lower leg with fat layer exposed: Secondary | ICD-10-CM | POA: Diagnosis not present

## 2019-05-06 NOTE — Progress Notes (Signed)
Benjamin [name], Benjamin [name] ([id number]) Visit Report for 05/05/2019 HPI Details Patient Name: Benjamin [name], Benjamin [name]. Date of Service: 05/05/2019 8:15 AM Medical Record Number: [id number] Patient Account Number: [id number] Date of Birth/Sex: 1951-01-06 (68 y.o. M) Treating RN: Cornell Barman Primary Care Provider: Salome Holmes Other Clinician: Referring Provider: Salome Holmes Treating Provider/Extender: Tito Dine in Treatment: 1 History of Present Illness HPI Description: 04/26/19 on evaluation today patient presents for initial evaluation our clinic concerning an issue that he has been having with the wound on the left anterior lower extremity which occurred in April 2020 as a result of a traumatic injury when he was working on a tree stump in his yard He states that the tree stump flew back and hit him in the shin causing a significant amount of bruising and abrasion even through his jeans. He did end up in the hospital for time where he was given antibiotics fortunately he's done with those at this point although he still appears to have a significant wound that drains quite a bit especially if he pushes at the 12 o'clock location he will have a significant amount of yellow drainage not really purulent noted. He is not have any significant pain at this time which is good news. He does state that he occasionally has gotten tissue as well as blood clots out of the wound. 05/05/2019; this is a patient who suffered a tree stump laceration injury on the left anterior mid tibia area. He was seen and admitted to clinic last week. Use silver alginate packing and a Kerlix Coban compression. I believe he took this off at some point and his wife put a gauze wrap on him. He complained about the wrap falling down. 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 His ABI in our clinic was noncompressible. He has 3 more days of Keflex which we apparently gave him last week. I do not see any cultures Electronic Signature(s) Signed: 05/05/2019 6:03:55 PM By: Linton Ham MD Entered By: Linton Ham on 05/05/2019 09:05:22 Benjamin [name], Benjamin [name] ([id number]) -------------------------------------------------------------------------------- Physical Exam Details Patient Name: Benjamin [name], [name] Date of Service: 05/05/2019 8:15 AM Medical Record Number: [id number] Patient Account Number: [id number] Date of Birth/Sex: 1951-06-21 (68 y.o. M) Treating RN: Cornell Barman Primary Care Provider: Salome Holmes Other Clinician: Referring Provider: Salome Holmes Treating Provider/Extender: Tito Dine in Treatment: 1 Constitutional Patient is hypertensive.. Pulse regular and within target range for patient.Marland Kitchen Respirations regular, non-labored and within target range.. Temperature is normal and within the target range for the patient.Marland Kitchen appears in no distress. Eyes Conjunctivae clear. No discharge. Respiratory Respiratory effort is easy and symmetric bilaterally. Rate is normal at rest and on room air.. Cardiovascular Popliteal pulses palpable. Pedal pulses are robust. Nonpitting edema in both legs. 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 He has nonpitting edema. Psychiatric No evidence of depression, anxiety, or agitation. Calm, cooperative, and communicative. Appropriate interactions and affect.. Notes Wound exam; the patient has a fairly significant wound over the left anterior tibia area. There is a tunnel superiorly at 12 at 6.8 cm. What I can see of the undermining area there is no exposed bone. There is no purulence. No surrounding tenderness Electronic Signature(s) Signed: 05/05/2019 6:03:55  PM By: Linton Ham MD Entered By: Linton Ham on 05/05/2019 09:04:34 Benjamin [name], Benjamin [name] ([id number]) -------------------------------------------------------------------------------- Physician Orders Details Patient Name: Benjamin [name], Benjamin [name]. Date of Service: 05/05/2019 8:15 AM Medical Record Number: [id number] Patient Account Number: [id number] Date of Birth/Sex: [birth year] (68 y.o. M) Treating RN: Cornell Barman Primary Care Provider: Salome Holmes Other Clinician: Referring Provider: Salome Holmes Treating Provider/Extender: Tito Dine in Treatment: 1 Verbal / Phone Orders: No Diagnosis Coding Wound Cleansing Wound #1 Left,Anterior Lower Leg o Clean wound with Normal Saline. Anesthetic (add to Medication List) Wound #1 Left,Anterior Lower Leg o Topical Lidocaine 4% cream applied to wound bed prior to debridement (In Clinic Only). Primary Wound Dressing Wound #1 Left,Anterior Lower Leg o Silver Alginate - Packed into tunnel at 12:00. Secondary Dressing Wound #1 Left,Anterior Lower Leg o XtraSorb Follow-up Appointments Wound #1 Left,Anterior Lower Leg o Return Appointment in 1 week. Edema Control Wound #1 Left,Anterior Lower Leg o 3 Layer Compression System - Left Lower Extremity Electronic Signature(s) Signed: 05/05/2019 6:03:55 PM By: Linton Ham MD Signed: 05/06/2019 4:58:07 PM By: Gretta Cool, BSN, RN, CWS, Kim RN, BSN Entered By: Gretta Cool, BSN, RN, CWS, Kim on 05/05/2019 09:00:19 MYAN, SUIT ([id number]) -------------------------------------------------------------------------------- Problem List Details Patient Name: Benjamin [name], [name] Date of Service: 05/05/2019 8:15 AM Medical Record Number: [id number] Patient Account Number: [id number] Date of Birth/Sex: [birth year] (68 y.o. M) Treating RN: Cornell Barman Primary Care Provider: Salome Holmes Other Clinician: Referring Provider: Salome Holmes Treating Provider/Extender: Tito Dine in  Treatment: 1 Active Problems ICD-10 Evaluated Encounter Code Description Active Date Today Diagnosis I87.2 Venous insufficiency (chronic) (peripheral) 04/26/2019 No Yes L97.822 Non-pressure chronic ulcer of other part of left lower leg with 04/26/2019 No Yes fat layer exposed I89.0 Lymphedema, not elsewhere classified 05/05/2019 No Yes Inactive Problems Resolved Problems Electronic Signature(s) Signed: 05/05/2019 6:03:55 PM By: Linton Ham MD Entered By: Linton Ham on 05/05/2019 Benjamin [name], Benjamin [name] ([id number]) -------------------------------------------------------------------------------- Progress Note Details Patient Name: Benjamin [name], [name] Date of Service: 05/05/2019 8:15 AM Medical Record Number: [id number] Patient Account Number: [id number] Date of Birth/Sex: 11/16/1951 (68 y.o. M) Treating RN: Cornell Barman Primary Care Provider: Salome Holmes Other Clinician: Referring Provider: Salome Holmes Treating Provider/Extender: Tito Dine in Treatment: 1 Subjective History of Present Illness (HPI) 04/26/19 on evaluation today patient presents for initial evaluation our clinic concerning an issue that he has been having with the wound on the left anterior lower extremity which occurred in April 2020 as a result of a traumatic injury when he was working on a tree stump in his yard He states that the tree stump flew back and hit him in the shin causing a significant amount of bruising and abrasion even through his jeans. He did end up in the hospital for time where he was given antibiotics fortunately he's done with those at this point although he still appears to have a significant wound that drains quite a bit especially if he pushes at the 12 o'clock location he will have a significant amount of yellow drainage not really purulent noted. He is not have any significant pain at this time which is good news. He does state that he occasionally has gotten tissue  as well as blood clots out of the wound. 05/05/2019; this is a patient who suffered a tree stump laceration injury on the left anterior mid tibia area. He was seen and admitted to clinic last week. Use silver alginate packing and a Kerlix Coban compression. I believe he  took this off at some point and his wife put a gauze wrap on him. He complained about the wrap falling down. 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 His ABI in our clinic was noncompressible. He has 3 more days of Keflex which we apparently gave him last week. I do not see any cultures Objective Constitutional Patient is hypertensive.. Pulse regular and within target range for patient.Marland Kitchen Respirations regular, non-labored and within target range.. Temperature is normal and within the target range for the patient.Marland Kitchen appears in no distress. Vitals Time Taken: 8:32 AM, Height: 68 in, Weight: 275 lbs, BMI: 41.8, Temperature: 97.8 F, Pulse: 80 bpm, Respiratory Rate: 16 breaths/min, Blood Pressure: 146/69 mmHg. Eyes Conjunctivae clear. No discharge. Respiratory Respiratory effort is easy and symmetric bilaterally. Rate is normal at rest and on room air.. Cardiovascular Popliteal pulses palpable. Pedal pulses are robust. Nonpitting edema in both legs. Psychiatric No evidence of depression, anxiety, or agitation. Calm, cooperative, and communicative. Appropriate interactions and affect.Marland Kitchen Benjamin [name], Benjamin [name] ([id number]) General Notes: Wound exam; the patient has a fairly significant wound over the left anterior tibia area. There is a tunnel superiorly at 12 at 6.8 cm. What I can see of the undermining area there is no exposed bone. There is no purulence. No surrounding tenderness Integumentary (Hair, Skin) Some brawny erythema on the anterior  tibia which I think is probably chronic venous issues/stasis dermatitis. He has nonpitting edema. Wound #1 status is Open. Original cause of wound was Trauma. The wound is located on the Left,Anterior Lower Leg. The wound measures 3cm length x 2cm width x 0.8cm depth; 4.712cm^2 area and 3.77cm^3 volume. There is Fat Layer (Subcutaneous Tissue) Exposed exposed. There is no undermining noted, however, there is tunneling at 12:00 with a maximum distance of 6.8cm. There is a large amount of serous drainage noted. The wound margin is flat and intact. There is medium (34-66%) red granulation within the wound bed. There is a medium (34-66%) amount of necrotic tissue within the wound bed including Adherent Slough. Assessment Active Problems ICD-10 Venous insufficiency (chronic) (peripheral) Non-pressure chronic ulcer of other part of left lower leg with fat layer exposed Lymphedema, not elsewhere classified Diagnoses ICD-10 I87.2: Venous insufficiency (chronic) (peripheral) G25.638: Non-pressure chronic ulcer of other part of left lower leg with fat layer exposed I89.0: Lymphedema, not elsewhere classified Procedures Wound #1 Pre-procedure diagnosis of Wound #1 is a Trauma, Other located on the Left,Anterior Lower Leg . There was a Three Layer Compression Therapy Procedure with a pre-treatment ABI of 1.5 by Cornell Barman, RN. Post procedure Diagnosis Wound #1: Same as Pre-Procedure Notes: MD states pulses and cap refill are good. Go with 3 layer wrap.. Plan Wound Cleansing: Wound #1 Left,Anterior Lower Leg: Clean wound with Normal Saline. Anesthetic (add to Medication List): Wound #1 Left,Anterior Lower Leg: [place], Sammuel S. ([id number]) Topical Lidocaine 4% cream applied to wound bed prior to debridement (In Clinic Only). Primary Wound Dressing: Wound #1 Left,Anterior Lower Leg: Silver Alginate - Packed into tunnel at 12:00. Secondary Dressing: Wound #1 Left,Anterior Lower  Leg: XtraSorb Follow-up Appointments: Wound #1 Left,Anterior Lower Leg: Return Appointment in 1 week. Edema Control: Wound #1 Left,Anterior Lower Leg: 3 Layer Compression System - Left Lower Extremity 1. I agree with continuing the silver alginate packing. I increase the  compression to 3 layer and we are going to leave it on all week. Cautioned the patient that this should not hurt although it should be tight. He is to take it off if this causes discomfort that makes him uncomfortable. His wife says he has restless leg syndrome and he was constantly picking at the wraps. 2. The tunneling area is substantial. 1 would wonder about a wound VAC under compression if this does not respond to routine dressings. 3. At this point I am concerned about the lack of compression and whether were going to be able to keep a wrap in place all week. He is not eligible for home health Electronic Signature(s) Signed: 05/05/2019 6:03:55 PM By: Linton Ham MD Entered By: Linton Ham on 05/05/2019 [place], Benjamin [name] ([id number]) -------------------------------------------------------------------------------- [place] Details Patient Name: Benjamin [name]. Date of Service: 05/05/2019 Medical Record Number: [id number] Patient Account Number: [id number] Date of Birth/Sex: July 15, 1951 (68 y.o. M) Treating RN: Cornell Barman Primary Care Provider: Salome Holmes Other Clinician: Referring Provider: Salome Holmes Treating Provider/Extender: Tito Dine in Treatment: 1 Diagnosis Coding ICD-10 Codes Code Description I87.2 Venous insufficiency (chronic) (peripheral) L97.822 Non-pressure chronic ulcer of other part of left lower leg with fat layer exposed I89.0 Lymphedema, not elsewhere classified Facility Procedures CPT4 Code: [id number] Description: (Facility Use Only) [phone] - [facility] LWR LT LEG Modifier: Quantity: 1 Physician Procedures CPT4 Code Description:  [id number] - WC PHYS LEVEL 3 - EST PT ICD-10 Diagnosis Description L97.822 Non-pressure chronic ulcer of other part of left lower leg wit I89.0 Lymphedema, not elsewhere classified Modifier: h fat layer expos Quantity: 1 ed Electronic Signature(s) Signed: 05/05/2019 6:03:55 PM By: Linton Ham MD Entered By: Linton Ham on 05/05/2019 09:07:23

## 2019-05-06 NOTE — Progress Notes (Signed)
JONTUE, CRUMPACKER ([id number]) Visit Report for 05/05/2019 Arrival Information Details Patient Name: Benjamin [name], Benjamin [name]. Date of Service: 05/05/2019 8:15 AM Medical Record Number: [id number] Patient Account Number: [id number] Date of Birth/Sex: 07-17-1951 (68 y.o. M) Treating [name]: Benjamin [name] Primary Care Benjamin [name]: Benjamin [name] Other Clinician: Referring Benjamin [name]: Benjamin [name] Treating Benjamin [name]/Extender: Benjamin [name] in Treatment: 1 Visit Information History Since Last Visit Added or deleted any medications: No Patient Arrived: Ambulatory Any new allergies or adverse reactions: No Arrival Time: 08:31 Had a fall or experienced change in No Accompanied By: wife activities of daily living that may affect Transfer Assistance: None risk of falls: Patient Identification Verified: Yes Signs or symptoms of abuse/neglect since last visito No Secondary Verification Process Completed: Yes Hospitalized since last visit: No Has Dressing in Place as Prescribed: Yes Has Compression in Place as Prescribed: Yes Pain Present Now: No Electronic Signature(s) Signed: 05/05/2019 4:39:36 PM By: Benjamin [name] Entered By: Benjamin [name] on 05/05/2019 08:32:17 Noyce, Ritchie S. ([id number]) -------------------------------------------------------------------------------- Compression Therapy Details Patient Name: Benjamin [name], [name] Date of Service: 05/05/2019 8:15 AM Medical Record Number: [id number] Patient Account Number: [id number] Date of Birth/Sex: 1951/05/22 (68 y.o. M) Treating [name]: Benjamin [name] Primary Care Susan Bleich: Benjamin [name] Other Clinician: Referring Quest Tavenner: Benjamin [name] Treating Micaila Ziemba/Extender: Benjamin [name] in Treatment: 1 Compression Therapy Performed for Wound Assessment: Wound #1 Left,Anterior Lower Leg Performed By: Clinician Benjamin Barman, [name] Compression Type: Three Layer Pre Treatment ABI: 1.5 Post Procedure Diagnosis Same as  Pre-procedure Notes MD states pulses and cap refill are good. Go with 3 layer wrap. Electronic Signature(s) Signed: 05/06/2019 4:58:07 PM By: Benjamin [name], BSN, [name], CWS, Benjamin [name], BSN Entered By: Benjamin [name] on 05/05/2019 08:54:25 Chretien, Benjamin [name] ([id number]) -------------------------------------------------------------------------------- Encounter Discharge Information Details Patient Name: Benjamin [name], [name] Date of Service: 05/05/2019 8:15 AM Medical Record Number: [id number] Patient Account Number: [id number] Date of Birth/Sex: 11-May-1951 (68 y.o. M) Treating [name]: Benjamin [name] Primary Care Benjamin [name]: Benjamin [name] Other Clinician: Referring Nastassja Witkop: Benjamin [name] Treating Benjamin [name]/Extender: Benjamin [name] in Treatment: 1 Encounter Discharge Information Items Discharge Condition: Stable Ambulatory Status: Ambulatory Discharge Destination: Home Transportation: Private Auto Accompanied By: spouse Schedule Follow-up Appointment: Yes Clinical Summary of Care: Electronic Signature(s) Signed: 05/05/2019 11:04:33 AM By: Benjamin [name] Entered By: Benjamin [name] on 05/05/2019 11:04:33 Goldston, [name] ([id number]) -------------------------------------------------------------------------------- Lower Extremity Assessment Details Patient Name: Benjamin [name], [name] Date of Service: 05/05/2019 8:15 AM Medical Record Number: [id number] Patient Account Number: [id number] Date of Birth/Sex: [birth year] (68 y.o. M) Treating [name]: Benjamin [name] Primary Care Bill Yohn: Benjamin [name] Other Clinician: Referring Benjamin [name]: Benjamin [name] Treating Benjamin [name]/Extender: Benjamin [name] in Treatment: 1 Edema Assessment Assessed: [Left: No] [Right: No] [Left: Edema] [Right: :] Calf Left: Right: Point of Measurement: 32 cm From Medial Instep 44 cm cm Ankle Left: Right: Point of Measurement: 12 cm From Medial Instep 23 cm cm Vascular Assessment Pulses: Dorsalis  Pedis Palpable: [Left:Yes] Posterior Tibial Palpable: [Left:Yes] Electronic Signature(s) Signed: 05/05/2019 4:39:36 PM By: Benjamin [name] Entered By: Benjamin [name] on 05/05/2019 08:42:40 Serda, [name] ([id number]) -------------------------------------------------------------------------------- Multi Wound Chart Details Patient Name: Benjamin [name], [name] Date of Service: 05/05/2019 8:15 AM Medical Record Number: [id number] Patient Account Number: [id number] Date of Birth/Sex: [birth year] (68 y.o. M) Treating [name]: Benjamin [name] Primary Care Benjamin [name]: Benjamin [name] Other Clinician: Referring Benjamin [name]: Benjamin [name] Treating Benjamin [name]/Extender: Benjamin [name] in Treatment: 1 Vital Signs Height(in): 68 Pulse(bpm): 80 Weight(lbs): 275 Blood Pressure(mmHg): 146/69 Body Mass Index(BMI): 42 Temperature(F): 97.8 Respiratory  Rate 16 (breaths/min): Photos: [N/A:N/A] Wound Location: Left Lower Leg - Anterior N/A N/A Wounding Event: Trauma N/A N/A Primary Etiology: Trauma, Other N/A N/A Secondary Etiology: Venous Leg Ulcer N/A N/A Date Acquired: 03/11/2019 N/A N/A Weeks of Treatment: 1 N/A N/A Wound Status: Open N/A N/A Measurements L x W x D 3x2x0.8 N/A N/A (cm) Area (cm) : 4.712 N/A N/A Volume (cm) : 3.77 N/A N/A % Reduction in Area: -33.30% N/A N/A % Reduction in Volume: -433.20% N/A N/A Position 1 (o'clock): 12 Maximum Distance 1 (cm): 6.8 Tunneling: Yes N/A N/A Classification: Partial Thickness N/A N/A Exudate Amount: Large N/A N/A Exudate Type: Serous N/A N/A Exudate Color: amber N/A N/A Wound Margin: Flat and Intact N/A N/A Granulation Amount: Medium (34-66%) N/A N/A Granulation Quality: Red N/A N/A Necrotic Amount: Medium (34-66%) N/A N/A Exposed Structures: Fat Layer (Subcutaneous N/A N/A Tissue) Exposed: Yes Fascia: No Tendon: No Muscle: No Hoelzer, Sammie S. ([id number]) Joint: No Bone: No Epithelialization: Small (1-33%) N/A N/A Procedures  Performed: Compression Therapy N/A N/A Treatment Notes Electronic Signature(s) Signed: 05/05/2019 6:03:55 PM By: Linton Ham MD Entered By: Linton Ham on 05/05/2019 09:00:13 Meroney, Nelson. ([id number]) -------------------------------------------------------------------------------- Multi-Disciplinary Care Plan Details Patient Name: Benjamin Castle. Date of Service: 05/05/2019 8:15 AM Medical Record Number: [id number] Patient Account Number: [id number] Date of Birth/Sex: 1951/06/24 (68 y.o. M) Treating [name]: Benjamin [name] Primary Care Morrissa Shein: Benjamin [name] Other Clinician: Referring Jaimon Bugaj: Benjamin [name] Treating Andrik Sandt/Extender: Benjamin [name] in Treatment: 1 Active Inactive Abuse / Safety / Falls / Self Care Management Nursing Diagnoses: Potential for falls Goals: Patient will remain injury free related to falls Date Initiated: 04/27/2019 Target Resolution Date: 07/24/2019 Goal Status: Active Interventions: Assess fall risk on admission and as needed Notes: Orientation to the Wound Care Program Nursing Diagnoses: Knowledge deficit related to the wound healing center program Goals: Patient/caregiver will verbalize understanding of the [place] [facility] Date Initiated: 04/27/2019 Target Resolution Date: 07/24/2019 Goal Status: Active Interventions: Provide education on orientation to the wound center Notes: Pain, Acute or Chronic Nursing Diagnoses: Pain, acute or chronic: actual or potential Goals: Patient will verbalize adequate pain control and receive pain control interventions during procedures as needed Date Initiated: 04/26/2019 Target Resolution Date: 05/28/2019 Goal Status: Active Interventions: Assess comfort goal upon admission Benjamin [name], WHERRY. ([id number]) Complete pain assessment as per visit requirements Notes: Wound/Skin Impairment Nursing Diagnoses: Impaired tissue integrity Goals: Ulcer/skin breakdown will have a  volume reduction of 30% by week 4 Date Initiated: 04/26/2019 Target Resolution Date: 05/24/2019 Goal Status: Active Ulcer/skin breakdown will have a volume reduction of 50% by week 8 Date Initiated: 04/26/2019 Target Resolution Date: 06/21/2019 Goal Status: Active Interventions: Assess patient/caregiver ability to obtain necessary supplies Assess ulceration(s) every visit Notes: Electronic Signature(s) Signed: 05/06/2019 4:58:07 PM By: Benjamin [name], BSN, [name], CWS, Benjamin [name], BSN Entered By: Benjamin [name] on 05/05/2019 08:50:47 Benjamin [name], Benjamin [name] ([id number]) -------------------------------------------------------------------------------- Pain Assessment Details Patient Name: Benjamin [name], [name] Date of Service: 05/05/2019 8:15 AM Medical Record Number: [id number] Patient Account Number: [id number] Date of Birth/Sex: [birth year] (68 y.o. M) Treating [name]: Benjamin [name] Primary Care Anthonia Monger: Benjamin [name] Other Clinician: Referring Keyuna Cuthrell: Benjamin [name] Treating Dajohn Ellender/Extender: Benjamin [name] in Treatment: 1 Active Problems Location of Pain Severity and Description of Pain Patient Has Paino No Site Locations Pain Management and Medication Current Pain Management: Electronic Signature(s) Signed: 05/05/2019 4:39:36 PM By: Benjamin [name] Entered By: Benjamin [name] on 05/05/2019 08:32:39 Struve, Harlo Chauncey [name] ([id number]) -------------------------------------------------------------------------------- Patient/Caregiver Education Details  Patient Name: DAHIR, AYER. Date of Service: 05/05/2019 8:15 AM Medical Record Number: [id number] Patient Account Number: [id number] Date of Birth/Gender: 1951-08-16 (68 y.o. M) Treating [name]: Benjamin [name] Primary Care Physician: Benjamin [name] Other Clinician: Referring Physician: Salome [name] Treating Physician/Extender: Benjamin [name] in Treatment: 1 Education Assessment Education Provided To: Patient Education Topics  Provided Wound/Skin Impairment: Handouts: Caring for Your Ulcer Methods: Demonstration, Explain/Verbal Responses: State content correctly Electronic Signature(s) Signed: 05/06/2019 4:58:07 PM By: Benjamin [name], BSN, [name], CWS, Benjamin [name], BSN Entered By: Benjamin [name] on 05/05/2019 09:01:07 Benjamin Castle ([id number]) -------------------------------------------------------------------------------- Wound Assessment Details Patient Name: Benjamin [name], [name] Date of Service: 05/05/2019 8:15 AM Medical Record Number: [id number] Patient Account Number: [id number] Date of Birth/Sex: 10-04-1951 (68 y.o. M) Treating [name]: Benjamin [name] Primary Care Wetzel Meester: Benjamin [name] Other Clinician: Referring Pritika Alvarez: Benjamin [name] Treating Shealeigh Dunstan/Extender: Benjamin [name] in Treatment: 1 Wound Status Wound Number: 1 Primary Etiology: Trauma, Other Wound Location: Left Lower Leg - Anterior Secondary Etiology: Venous Leg Ulcer Wounding Event: Trauma Wound Status: Open Date Acquired: 03/11/2019 Weeks Of Treatment: 1 Clustered Wound: No Photos Wound Measurements Length: (cm) 3 % Reduction in Width: (cm) 2 % Reduction in Depth: (cm) 0.8 Epithelializat Area: (cm) 4.712 Tunneling: Volume: (cm) 3.77 Position ( Maximum Dis Area: -33.3% Volume: -433.2% ion: Small (1-33%) Yes o'clock): 12 tance: (cm) 6.8 Undermining: No Wound Description Classification: Partial Thickness Foul Odor Aft Wound Margin: Flat and Intact Slough/Fibrin Exudate Amount: Large Exudate Type: Serous Exudate Color: amber er Cleansing: No o Yes Wound Bed Granulation Amount: Medium (34-66%) Exposed Structure Granulation Quality: Red Fascia Exposed: No Necrotic Amount: Medium (34-66%) Fat Layer (Subcutaneous Tissue) Exposed: Yes Necrotic Quality: Adherent Slough Tendon Exposed: No Muscle Exposed: No Joint Exposed: No Bone Exposed: No Benjamin [name], [name] ([id number]) Treatment Notes Wound #1 (Left,  Anterior Lower Leg) Notes Silver alginate packed, xtrasorb, 3 layer wrap Electronic Signature(s) Signed: 05/05/2019 4:39:36 PM By: Benjamin [name] Entered By: Benjamin [name] on 05/05/2019 08:40:27 Benjamin [name], [name] ([id number]) -------------------------------------------------------------------------------- Vitals Details Patient Name: Benjamin [name], Rajesh S. Date of Service: 05/05/2019 8:15 AM Medical Record Number: [id number] Patient Account Number: [id number] Date of Birth/Sex: February 07, 1951 (68 y.o. M) Treating [name]: Benjamin [name] Primary Care Genny Caulder: Benjamin [name] Other Clinician: Referring Neita Landrigan: Benjamin [name] Treating Samai Corea/Extender: Benjamin [name] in Treatment: 1 Vital Signs Time Taken: 08:32 Temperature (F): 97.8 Height (in): 68 Pulse (bpm): 80 Weight (lbs): 275 Respiratory Rate (breaths/min): 16 Body Mass Index (BMI): 41.8 Blood Pressure (mmHg): 146/69 Reference Range: 80 - 120 mg / dl Electronic Signature(s) Signed: 05/05/2019 4:39:36 PM By: Benjamin [name] Entered By: Benjamin [name] on 05/05/2019 08:33:36

## 2019-05-07 ENCOUNTER — Other Ambulatory Visit: Payer: Self-pay

## 2019-05-07 DIAGNOSIS — L97822 Non-pressure chronic ulcer of other part of left lower leg with fat layer exposed: Secondary | ICD-10-CM | POA: Diagnosis not present

## 2019-05-07 NOTE — Progress Notes (Signed)
DANNY, YACKLEY ([id number]) Visit Report for 05/07/2019 Arrival Information Details Patient Name: Benjamin [name], Benjamin [name]. Date of Service: 05/07/2019 12:30 PM Medical Record Number: [id number] Patient Account Number: [id number] Date of Birth/Sex: 08-05-1951 (68 y.o. M) Treating RN: Army Melia Primary Care Amyrah Pinkhasov: Salome Holmes Other Clinician: Referring Farhan Jean: Salome Holmes Treating Marget Outten/Extender: Melburn Hake, HOYT Weeks in Treatment: 1 Visit Information History Since Last Visit Added or deleted any medications: No Patient Arrived: Ambulatory Any new allergies or adverse reactions: No Arrival Time: 12:41 Had a fall or experienced change in No Accompanied By: wife activities of daily living that may affect Transfer Assistance: None risk of falls: Signs or symptoms of abuse/neglect since last visito No Hospitalized since last visit: No Has Dressing in Place as Prescribed: Yes Pain Present Now: No Electronic Signature(s) Signed: 05/07/2019 4:22:16 PM By: Army Melia Entered By: Army Melia on 05/07/2019 12:41:47 Teagle, Jonathin S. ([id number]) -------------------------------------------------------------------------------- Clinic Level of Care Assessment Details Patient Name: Benjamin [name], [name] Date of Service: 05/07/2019 12:30 PM Medical Record Number: [id number] Patient Account Number: [id number] Date of Birth/Sex: Mar 20, 1951 (68 y.o. M) Treating RN: Army Melia Primary Care Jilleen Essner: Salome Holmes Other Clinician: Referring Heran Campau: Salome Holmes Treating Loreta Blouch/Extender: Melburn Hake, HOYT Weeks in Treatment: 1 Clinic Level of Care Assessment Items TOOL 4 Quantity Score []  - Use when only an EandM is performed on FOLLOW-UP visit 0 ASSESSMENTS - Nursing Assessment / Reassessment X - Reassessment of Co-morbidities (includes updates in patient status) 1 10 X- 1 5 Reassessment of Adherence to Treatment Plan ASSESSMENTS - Wound and Skin Assessment /  Reassessment X - Simple Wound Assessment / Reassessment - one wound 1 5 []  - 0 Complex Wound Assessment / Reassessment - multiple wounds []  - 0 Dermatologic / Skin Assessment (not related to wound area) ASSESSMENTS - Focused Assessment []  - Circumferential Edema Measurements - multi extremities 0 []  - 0 Nutritional Assessment / Counseling / Intervention []  - 0 Lower Extremity Assessment (monofilament, tuning fork, pulses) []  - 0 Peripheral Arterial Disease Assessment (using hand held doppler) ASSESSMENTS - Ostomy and/or Continence Assessment and Care []  - Incontinence Assessment and Management 0 []  - 0 Ostomy Care Assessment and Management (repouching, etc.) PROCESS - Coordination of Care X - Simple Patient / Family Education for ongoing care 1 15 []  - 0 Complex (extensive) Patient / Family Education for ongoing care []  - 0 Staff obtains Programmer, systems, Records, Test Results / Process Orders []  - 0 Staff telephones HHA, Nursing Homes / Clarify orders / etc []  - 0 Routine Transfer to another Facility (non-emergent condition) []  - 0 Routine Hospital Admission (non-emergent condition) []  - 0 New Admissions / Biomedical engineer / Ordering NPWT, Apligraf, etc. []  - 0 Emergency Hospital Admission (emergent condition) X- 1 10 Simple Discharge Coordination Benjamin [name], [name] ([id number]) []  - 0 Complex (extensive) Discharge Coordination PROCESS - Special Needs []  - Pediatric / Minor Patient Management 0 []  - 0 Isolation Patient Management []  - 0 Hearing / Language / Visual special needs []  - 0 Assessment of Community assistance (transportation, D/C planning, etc.) []  - 0 Additional assistance / Altered mentation []  - 0 Support Surface(s) Assessment (bed, cushion, seat, etc.) INTERVENTIONS - Wound Cleansing / Measurement X - Simple Wound Cleansing - one wound 1 5 []  - 0 Complex Wound Cleansing - multiple wounds X- 1 5 Wound Imaging (photographs - any number of  wounds) []  - 0 Wound Tracing (instead of photographs) X- 1 5 Simple Wound Measurement - one wound []  - 0 Complex Wound  Measurement - multiple wounds INTERVENTIONS - Wound Dressings X - Small Wound Dressing one or multiple wounds 1 10 []  - 0 Medium Wound Dressing one or multiple wounds []  - 0 Large Wound Dressing one or multiple wounds []  - 0 Application of Medications - topical []  - 0 Application of Medications - injection INTERVENTIONS - Miscellaneous []  - External ear exam 0 []  - 0 Specimen Collection (cultures, biopsies, blood, body fluids, etc.) []  - 0 Specimen(s) / Culture(s) sent or taken to Lab for analysis []  - 0 Patient Transfer (multiple staff / Civil Service fast streamer / Similar devices) []  - 0 Simple Staple / Suture removal (25 or less) []  - 0 Complex Staple / Suture removal (26 or more) []  - 0 Hypo / Hyperglycemic Management (close monitor of Blood Glucose) []  - 0 Ankle / Brachial Index (ABI) - do not check if billed separately []  - 0 Vital Signs Benjamin [name], [name] ([id number]) Has the patient been seen at the hospital within the last three years: Yes Total Score: 70 Level Of Care: New/Established - Level 2 Electronic Signature(s) Signed: 05/07/2019 4:22:16 PM By: Army Melia Entered By: Army Melia on 05/07/2019 13:04:58 Ambler, Mccall S. ([id number]) -------------------------------------------------------------------------------- Compression Therapy Details Patient Name: Benjamin [name], [name] Date of Service: 05/07/2019 12:30 PM Medical Record Number: [id number] Patient Account Number: [id number] Date of Birth/Sex: 1951-09-16 (68 y.o. M) Treating RN: Army Melia Primary Care Yannet Rincon: Salome Holmes Other Clinician: Referring Jerardo Costabile: Salome Holmes Treating Ayson Cherubini/Extender: Melburn Hake, HOYT Weeks in Treatment: 1 Compression Therapy Performed for Wound Assessment: Wound #1 Left,Anterior Lower Leg Performed By: Clinician Army Melia, RN Compression Type:  Three Layer Pre Treatment ABI: 1.5 Electronic Signature(s) Signed: 05/07/2019 4:22:16 PM By: Army Melia Entered By: Army Melia on 05/07/2019 12:52:09 Benjamin [name], Benjamin [name] ([id number]) -------------------------------------------------------------------------------- Encounter Discharge Information Details Patient Name: Benjamin [name], [name] Date of Service: 05/07/2019 12:30 PM Medical Record Number: [id number] Patient Account Number: [id number] Date of Birth/Sex: May 17, 1951 (68 y.o. M) Treating RN: Army Melia Primary Care Haide Klinker: Salome Holmes Other Clinician: Referring Tawana Pasch: Salome Holmes Treating Nehemie Casserly/Extender: Melburn Hake, HOYT Weeks in Treatment: 1 Encounter Discharge Information Items Discharge Condition: Stable Ambulatory Status: Ambulatory Discharge Destination: Home Transportation: Private Auto Accompanied By: wife Schedule Follow-up Appointment: Yes Clinical Summary of Care: Electronic Signature(s) Signed: 05/07/2019 4:22:16 PM By: Army Melia Entered By: Army Melia on 05/07/2019 13:04:31 Benjamin [name], [name] ([id number]) -------------------------------------------------------------------------------- Wound Assessment Details Patient Name: Benjamin [name], [name] Date of Service: 05/07/2019 12:30 PM Medical Record Number: [id number] Patient Account Number: [id number] Date of Birth/Sex: Feb 25, 1951 (68 y.o. M) Treating RN: Army Melia Primary Care Vang Kraeger: Salome Holmes Other Clinician: Referring Patricia Perales: Salome Holmes Treating Kosta Schnitzler/Extender: Melburn Hake, HOYT Weeks in Treatment: 1 Wound Status Wound Number: 1 Primary Etiology: Trauma, Other Wound Location: Left Lower Leg - Anterior Secondary Etiology: Venous Leg Ulcer Wounding Event: Trauma Wound Status: Open Date Acquired: 03/11/2019 Weeks Of Treatment: 1 Clustered Wound: No Photos Photo Uploaded By: Army Melia on 05/07/2019 15:05:47 Wound Measurements Length: (cm) 3.2 Width: (cm) 2.1 Depth:  (cm) 6.2 Area: (cm) 5.278 Volume: (cm) 32.723 % Reduction in Area: -49.3% % Reduction in Volume: -4528.4% Epithelialization: Small (1-33%) Tunneling: Yes Position (o'clock): 12 Maximum Distance: (cm) 6.2 Wound Description Classification: Partial Thickness Wound Margin: Flat and Intact Exudate Amount: Large Exudate Type: Serous Exudate Color: amber Foul Odor After Cleansing: No Slough/Fibrino Yes Wound Bed Granulation Amount: Medium (34-66%) Exposed Structure Granulation Quality: Red Fascia Exposed: No Necrotic Amount: Medium (34-66%) Fat Layer (Subcutaneous Tissue) Exposed: Yes Necrotic Quality: Adherent Slough Tendon  Exposed: No Muscle Exposed: No Joint Exposed: No Bone Exposed: No Benjamin [name], Benjamin [place]. ([id number]) Treatment Notes Wound #1 (Left, Anterior Lower Leg) Notes Silver alginate packed, xtrasorb, 3 layer wrap Electronic Signature(s) Signed: 05/07/2019 4:22:16 PM By: Army Melia Entered By: Army Melia on 05/07/2019 12:51:47

## 2019-05-12 ENCOUNTER — Encounter: Payer: Medicare Other | Admitting: Internal Medicine

## 2019-05-12 ENCOUNTER — Other Ambulatory Visit: Payer: Self-pay

## 2019-05-12 DIAGNOSIS — L97822 Non-pressure chronic ulcer of other part of left lower leg with fat layer exposed: Secondary | ICD-10-CM | POA: Diagnosis not present

## 2019-05-15 NOTE — Progress Notes (Signed)
Benjamin [name] ([id number]) Visit Report for 05/12/2019 HPI Details Patient Name: Benjamin [name], Benjamin [name]. Date of Service: 05/12/2019 8:15 AM Medical Record Number: [id number] Patient Account Number: [id number] Date of Birth/Sex: Mar 20, 1951 (68 y.o. M) Treating RN: Benjamin [name] Primary Care Provider: Salome [name] Other Clinician: Referring Provider: Salome [name] Treating Provider/Extender: Benjamin [name] in Treatment: 2 History of Present Illness HPI Description: 04/26/19 on evaluation today patient presents for initial evaluation our clinic concerning an issue that he has been having with the wound on the left anterior lower extremity which occurred in April 2020 as a result of a traumatic injury when he was working on a tree stump in his yard He states that the tree stump flew back and hit him in the shin causing a significant amount of bruising and abrasion even through his jeans. He did end up in the hospital for time where he was given antibiotics fortunately he's done with those at this point although he still appears to have a significant wound that drains quite a bit especially if he pushes at the 12 o'clock location he will have a significant amount of yellow drainage not really purulent noted. He is not have any significant pain at this time which is good news. He does state that he occasionally has gotten tissue as well as blood clots out of the wound. 05/05/2019; this is a patient who suffered a tree stump laceration injury on the left anterior mid tibia area. He was seen and admitted to clinic last week. Use silver alginate packing and a Kerlix Coban compression. I believe he took this off at some point and his wife put a gauze wrap on him. He complained about the wrap falling down. 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 His ABI in our clinic was noncompressible. He has 3 more days of Keflex which we apparently gave him last week. I do not see any cultures 6/24; traumatic wound on the left anterior mid tibia likely in the setting of chronic venous insufficiency with some degree of secondary lymphedema. He tolerated the 3 layer wrap I put him on him last week and the superior tunnel is gone from over 6 cm to 2.6 cm today. What I can see if the tissue actually looks quite good. We would using silver alginate as the primary dressing Electronic Signature(s) Signed: 05/12/2019 4:13:17 PM By: Benjamin Ham MD Entered By: Benjamin [name] on 05/12/2019 08:52:18 Benjamin [name] ([id number]) -------------------------------------------------------------------------------- Physical Exam Details Patient Name: Benjamin [name], [name] Date of Service: 05/12/2019 8:15 AM Medical Record Number: [id number] Patient Account Number: [id number] Date of Birth/Sex: 14-Aug-1951 (68 y.o. M) Treating RN: Benjamin [name] Primary Care Provider: Salome [name] Other Clinician: Referring Provider: Salome [name] Treating Provider/Extender: Benjamin [name] in Treatment: 2 Constitutional Patient is hypertensive.. Pulse regular and within target range for patient.Marland Kitchen Respirations regular, non-labored and within target range.. Temperature is normal and within the target range for the patient.Marland Kitchen appears in no distress. Eyes Conjunctivae clear. No discharge. Respiratory Respiratory effort is easy and symmetric bilaterally. Rate is normal at rest and on room air.. Cardiovascular Dorsalis pedis pulse and posterior tibial pulse on the left are palpable. Excellent edema control. Integumentary (Hair, Skin) Patient points out tissue discoloration superiorly and laterally to the actual wound I suspect  this is probably resolving bruising. There is no tenderness no crepitus in this area.Marland Kitchen Psychiatric No  evidence of depression, anxiety, or agitation. Calm, cooperative, and communicative. Appropriate interactions and affect.. Notes Wound exam; patient has a fairly significant wound over the left anterior tibia. The tunneling superiorly has come in nicely. What I can see of the tissue both in the wound bed and tunnel looks quite healthy. There is no erythema no crepitus no palpable tenderness. Electronic Signature(s) Signed: 05/12/2019 4:13:17 PM By: Benjamin Ham MD Entered By: Benjamin [name] on 05/12/2019 08:49:56 Benjamin [name] ([id number]) -------------------------------------------------------------------------------- Physician Orders Details Patient Name: Benjamin [name], [name] Date of Service: 05/12/2019 8:15 AM Medical Record Number: [id number] Patient Account Number: [id number] Date of Birth/Sex: 03-13-1951 (68 y.o. M) Treating RN: Benjamin [name] Primary Care Provider: Salome [name] Other Clinician: Referring Provider: Salome [name] Treating Provider/Extender: Benjamin [name] in Treatment: 2 Verbal / Phone Orders: No Diagnosis Coding Wound Cleansing Wound #1 Left,Anterior Lower Leg o Clean wound with Normal Saline. Anesthetic (add to Medication List) Wound #1 Left,Anterior Lower Leg o Topical Lidocaine 4% cream applied to wound bed prior to debridement (In Clinic Only). Primary Wound Dressing Wound #1 Left,Anterior Lower Leg o Silver Alginate - Packed into tunnel at 12:00. Secondary Dressing Wound #1 Left,Anterior Lower Leg o XtraSorb Follow-up Appointments Wound #1 Left,Anterior Lower Leg o Return Appointment in 1 week. Edema Control Wound #1 Left,Anterior Lower Leg o 3 Layer Compression System - Left Lower Extremity Electronic Signature(s) Signed: 05/12/2019 4:13:17 PM By: Benjamin Ham MD Signed: 05/14/2019 4:38:11 PM By: Benjamin [name], BSN, RN, CWS, Benjamin [name] Entered By: Benjamin [name], BSN, RN, CWS, Benjamin on 05/12/2019 08:42:41 Benjamin [name], Benjamin [name]  ([id number]) -------------------------------------------------------------------------------- Problem List Details Patient Name: BOHANON, [name] Date of Service: 05/12/2019 8:15 AM Medical Record Number: [id number] Patient Account Number: [id number] Date of Birth/Sex: 25-Jun-1951 (68 y.o. M) Treating RN: Benjamin [name] Primary Care Provider: Salome [name] Other Clinician: Referring Provider: Salome [name] Treating Provider/Extender: Benjamin [name] in Treatment: 2 Active Problems ICD-10 Evaluated Encounter Code Description Active Date Today Diagnosis I87.2 Venous insufficiency (chronic) (peripheral) 04/26/2019 No Yes L97.822 Non-pressure chronic ulcer of other part of left lower leg with 04/26/2019 No Yes fat layer exposed I89.0 Lymphedema, not elsewhere classified 05/05/2019 No Yes Inactive Problems Resolved Problems Electronic Signature(s) Signed: 05/12/2019 4:13:17 PM By: Benjamin Ham MD Entered By: Benjamin [name] on 05/12/2019 08:45:11 Benjamin [name], [name] ([id number]) -------------------------------------------------------------------------------- Progress Note Details Patient Name: Benjamin [name], [name] Date of Service: 05/12/2019 8:15 AM Medical Record Number: [id number] Patient Account Number: [id number] Date of Birth/Sex: 1950/11/29 (68 y.o. M) Treating RN: Benjamin [name] Primary Care Provider: Salome [name] Other Clinician: Referring Provider: Salome [name] Treating Provider/Extender: Benjamin [name] in Treatment: 2 Subjective History of Present Illness (HPI) 04/26/19 on evaluation today patient presents for initial evaluation our clinic concerning an issue that he has been having with the wound on the left anterior lower extremity which occurred in April 2020 as a result of a traumatic injury when he was working on a tree stump in his yard He states that the tree stump flew back and hit him in the shin causing a significant amount of bruising and abrasion even  through his jeans. He did end up in the hospital for time where he was given antibiotics fortunately he's done with those at this point although he still appears to have a significant wound that drains quite a bit especially if he pushes at the 12 o'clock location he will have  a significant amount of yellow drainage not really purulent noted. He is not have any significant pain at this time which is good news. He does state that he occasionally has gotten tissue as well as blood clots out of the wound. 05/05/2019; this is a patient who suffered a tree stump laceration injury on the left anterior mid tibia area. He was seen and admitted to clinic last week. Use silver alginate packing and a Kerlix Coban compression. I believe he took this off at some point and his wife put a gauze wrap on him. He complained about the wrap falling down. 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 His ABI in our clinic was noncompressible. He has 3 more days of Keflex which we apparently gave him last week. I do not see any cultures 6/24; traumatic wound on the left anterior mid tibia likely in the setting of chronic venous insufficiency with some degree of secondary lymphedema. He tolerated the 3 layer wrap I put him on him last week and the superior tunnel is gone from over 6 cm to 2.6 cm today. What I can see if the tissue actually looks quite good. We would using silver alginate as the primary dressing Objective Constitutional Patient is hypertensive.. Pulse regular and within target range for patient.Marland Kitchen Respirations regular, non-labored and within target range.. Temperature is normal and within the target range for the patient.Marland Kitchen appears in no distress. Vitals Time Taken: 8:25 AM, Height: 68 in, Weight: 275 lbs, BMI: 41.8,  Temperature: 98.2 F, Pulse: 85 bpm, Respiratory Rate: 16 breaths/min, Blood Pressure: 153/63 mmHg. Eyes Conjunctivae clear. No discharge. Respiratory Respiratory effort is easy and symmetric bilaterally. Rate is normal at rest and on room air.. Cardiovascular Mol, Callahan S. ([id number]) Dorsalis pedis pulse and posterior tibial pulse on the left are palpable. Excellent edema control. Psychiatric No evidence of depression, anxiety, or agitation. Calm, cooperative, and communicative. Appropriate interactions and affect.. General Notes: Wound exam; patient has a fairly significant wound over the left anterior tibia. The tunneling superiorly has come in nicely. What I can see of the tissue both in the wound bed and tunnel looks quite healthy. There is no erythema no crepitus no palpable tenderness. Integumentary (Hair, Skin) Patient points out tissue discoloration superiorly and laterally to the actual wound I suspect this is probably resolving bruising. There is no tenderness no crepitus in this area.. Wound #1 status is Open. Original cause of wound was Trauma. The wound is located on the Left,Anterior Lower Leg. The wound measures 3.1cm length x 1.5cm width x 1cm depth; 3.652cm^2 area and 3.652cm^3 volume. There is Fat Layer (Subcutaneous Tissue) Exposed exposed. There is no undermining noted, however, there is tunneling at 12:00 with a maximum distance of 2.6cm. There is a large amount of serous drainage noted. The wound margin is flat and intact. There is large (67-100%) red granulation within the wound bed. There is a small (1-33%) amount of necrotic tissue within the wound bed including Adherent Slough. Assessment Active Problems ICD-10 Venous insufficiency (chronic) (peripheral) Non-pressure chronic ulcer of other part of left lower leg with fat layer exposed Lymphedema, not elsewhere classified Plan Wound Cleansing: Wound #1 Left,Anterior Lower Leg: Clean wound with Normal  Saline. Anesthetic (add to Medication List): Wound #1 Left,Anterior Lower Leg: Topical Lidocaine 4%  cream applied to wound bed prior to debridement (In Clinic Only). Primary Wound Dressing: Wound #1 Left,Anterior Lower Leg: Silver Alginate - Packed into tunnel at 12:00. Secondary Dressing: Wound #1 Left,Anterior Lower Leg: XtraSorb Follow-up Appointments: Wound #1 Left,Anterior Lower Leg: Return Appointment in 1 week. Edema Control: Wound #1 Left,Anterior Lower Leg: 3 Layer Compression System - Left Lower Extremity Benjamin [name], [name] ([id number]) 1. For this week I am continuing with the silver alginate without a nice improvement in the tunneling area superiorly. 2. When I first saw this I thought we might need a wound VAC I am less inclined that that is going to be necessary 3. Possible changed to silver collagen next week. 4. He has tolerated the 3 layer compression well, his ABIs are noncompressible however his pulses are palpable I do not think tissue ischemia plays a role in this Electronic Signature(s) Signed: 05/12/2019 8:52:52 AM By: Benjamin Ham MD Entered By: Benjamin [name] on 05/12/2019 08:52:52 Benjamin [name], [name] ([id number]) -------------------------------------------------------------------------------- SuperBill Details Patient Name: Benjamin [name], [name] Date of Service: 05/12/2019 Medical Record Number: [id number] Patient Account Number: [id number] Date of Birth/Sex: 06-26-1951 (68 y.o. M) Treating RN: Benjamin [name] Primary Care Provider: Salome [name] Other Clinician: Referring Provider: Salome [name] Treating Provider/Extender: Benjamin [name] in Treatment: 2 Diagnosis Coding ICD-10 Codes Code Description I87.2 Venous insufficiency (chronic) (peripheral) L97.822 Non-pressure chronic ulcer of other part of left lower leg with fat layer exposed I89.0 Lymphedema, not elsewhere classified Facility Procedures CPT4 Code: [id number] Description:  (Facility Use Only) [phone] - [place] LWR LT LEG Modifier: Quantity: 1 Physician Procedures CPT4 Code Description: [id number] - WC PHYS LEVEL 3 - EST PT ICD-10 Diagnosis Description L97.822 Non-pressure chronic ulcer of other part of left lower leg wit I89.0 Lymphedema, not elsewhere classified I87.2 Venous insufficiency (chronic)  (peripheral) Modifier: h fat layer expos Quantity: 1 ed Electronic Signature(s) Signed: 05/12/2019 4:13:17 PM By: Benjamin Ham MD Entered By: Benjamin [name] on 05/12/2019 08:53:29

## 2019-05-15 NOTE — Progress Notes (Signed)
Benjamin [name], Benjamin [name] ([id number]) Visit Report for 05/12/2019 Arrival Information Details Patient Name: Benjamin [name], Benjamin [name]. Date of Service: 05/12/2019 8:15 AM Medical Record Number: [id number] Patient Account Number: [id number] Date of Birth/Sex: Oct 18, 1951 (68 y.o. M) Treating RN: Montey Hora Primary Care Hollee Fate: Salome Holmes Other Clinician: Referring Towanda Hornstein: Salome Holmes Treating Ralphael Southgate/Extender: Tito Dine in Treatment: 2 Visit Information History Since Last Visit Added or deleted any medications: No Patient Arrived: Ambulatory Any new allergies or adverse reactions: No Arrival Time: 08:25 Had a fall or experienced change in No Accompanied By: self activities of daily living that may affect Transfer Assistance: None risk of falls: Patient Identification Verified: Yes Signs or symptoms of abuse/neglect since last visito No Secondary Verification Process Completed: Yes Hospitalized since last visit: No Implantable device outside of the clinic excluding No cellular tissue based products placed in the center since last visit: Has Dressing in Place as Prescribed: Yes Has Compression in Place as Prescribed: Yes Pain Present Now: No Electronic Signature(s) Signed: 05/13/2019 5:10:09 PM By: Montey Hora Entered By: Montey Hora on 05/12/2019 08:25:40 Benjamin [name], [name] ([id number]) -------------------------------------------------------------------------------- Encounter Discharge Information Details Patient Name: Benjamin [name], [name] Date of Service: 05/12/2019 8:15 AM Medical Record Number: [id number] Patient Account Number: [id number] Date of Birth/Sex: 1951/10/25 (68 y.o. M) Treating RN: Cornell Barman Primary Care Catriona Dillenbeck: Salome Holmes Other Clinician: Referring Shaily Librizzi: Salome Holmes Treating Aristea Posada/Extender: Tito Dine in Treatment: 2 Encounter Discharge Information Items Discharge Condition: Stable Ambulatory Status:  Ambulatory Discharge Destination: Home Transportation: Private Auto Accompanied By: self Schedule Follow-up Appointment: Yes Clinical Summary of Care: Electronic Signature(s) Signed: 05/14/2019 4:38:11 PM By: Gretta Cool, BSN, RN, CWS, Kim RN, BSN Entered By: Gretta Cool, BSN, RN, CWS, Kim on 05/12/2019 08:53:40 Turski, Benjamin [name] Loron ([id number]) -------------------------------------------------------------------------------- Lower Extremity Assessment Details Patient Name: Ems, Caelum S. Date of Service: 05/12/2019 8:15 AM Medical Record Number: [id number] Patient Account Number: [id number] Date of Birth/Sex: July 18, 1951 (68 y.o. M) Treating RN: Montey Hora Primary Care Kensi Karr: Salome Holmes Other Clinician: Referring Zayah Keilman: Salome Holmes Treating Marte Celani/Extender: Tito Dine in Treatment: 2 Edema Assessment Assessed: [Left: No] [Right: No] Edema: [Left: N] [Right: o] Calf Left: Right: Point of Measurement: 32 cm From Medial Instep 44 cm cm Ankle Left: Right: Point of Measurement: 12 cm From Medial Instep 26.5 cm cm Vascular Assessment Pulses: Dorsalis Pedis Palpable: [Left:Yes] Electronic Signature(s) Signed: 05/13/2019 5:10:09 PM By: Montey Hora Entered By: Montey Hora on 05/12/2019 08:29:55 Benjamin [name], [name] ([id number]) -------------------------------------------------------------------------------- Multi Wound Chart Details Patient Name: Benjamin [name], [name] Date of Service: 05/12/2019 8:15 AM Medical Record Number: [id number] Patient Account Number: [id number] Date of Birth/Sex: 1951-05-20 (68 y.o. M) Treating RN: Cornell Barman Primary Care Carry Weesner: Salome Holmes Other Clinician: Referring Majed Pellegrin: Salome Holmes Treating Taima Rada/Extender: Tito Dine in Treatment: 2 Vital Signs Height(in): 68 Pulse(bpm): 85 Weight(lbs): 275 Blood Pressure(mmHg): 153/63 Body Mass Index(BMI): 42 Temperature(F): 98.2 Respiratory  Rate 16 (breaths/min): Photos: [N/A:N/A] Wound Location: Left Lower Leg - Anterior N/A N/A Wounding Event: Trauma N/A N/A Primary Etiology: Trauma, Other N/A N/A Secondary Etiology: Venous Leg Ulcer N/A N/A Date Acquired: 03/11/2019 N/A N/A Weeks of Treatment: 2 N/A N/A Wound Status: Open N/A N/A Measurements L x W x D 3.1x1.5x1 N/A N/A (cm) Area (cm) : 3.652 N/A N/A Volume (cm) : 3.652 N/A N/A % Reduction in Area: -3.30% N/A N/A % Reduction in Volume: -416.50% N/A N/A Position 1 (o'clock): 12 Maximum Distance 1 (cm): 2.6 Tunneling: Yes N/A N/A Classification: Partial Thickness N/A N/A Exudate  Amount: Large N/A N/A Exudate Type: Serous N/A N/A Exudate Color: amber N/A N/A Wound Margin: Flat and Intact N/A N/A Granulation Amount: Large (67-100%) N/A N/A Granulation Quality: Red N/A N/A Necrotic Amount: Small (1-33%) N/A N/A Exposed Structures: Fat Layer (Subcutaneous N/A N/A Tissue) Exposed: Yes Fascia: No Tendon: No Muscle: No Benjamin [name], [name] ([id number]) Joint: No Bone: No Epithelialization: Small (1-33%) N/A N/A Treatment Notes Electronic Signature(s) Signed: 05/12/2019 4:13:17 PM By: Linton Ham MD Entered By: Linton Ham on 05/12/2019 08:45:31 Benjamin [name], Benjamin [name] ([id number]) -------------------------------------------------------------------------------- Multi-Disciplinary Care Plan Details Patient Name: Benjamin Castle. Date of Service: 05/12/2019 8:15 AM Medical Record Number: [id number] Patient Account Number: [id number] Date of Birth/Sex: March 02, 1951 (68 y.o. M) Treating RN: Cornell Barman Primary Care Benjamin Kistler: Salome Holmes Other Clinician: Referring Dustina Scoggin: Salome Holmes Treating Jaquille Kau/Extender: Tito Dine in Treatment: 2 Active Inactive Abuse / Safety / Falls / Self Care Management Nursing Diagnoses: Potential for falls Goals: Patient will remain injury free related to falls Date Initiated: 04/27/2019 Target  Resolution Date: 07/24/2019 Goal Status: Active Interventions: Assess fall risk on admission and as needed Notes: Orientation to the Wound Care Program Nursing Diagnoses: Knowledge deficit related to the wound healing center program Goals: Patient/caregiver will verbalize understanding of the [place] Program Date Initiated: 04/27/2019 Target Resolution Date: 07/24/2019 Goal Status: Active Interventions: Provide education on orientation to the wound center Notes: Pain, Acute or Chronic Nursing Diagnoses: Pain, acute or chronic: actual or potential Goals: Patient will verbalize adequate pain control and receive pain control interventions during procedures as needed Date Initiated: 04/26/2019 Target Resolution Date: 05/28/2019 Goal Status: Active Interventions: Assess comfort goal upon admission BLAIRE, HODSDON. ([id number]) Complete pain assessment as per visit requirements Notes: Wound/Skin Impairment Nursing Diagnoses: Impaired tissue integrity Goals: Ulcer/skin breakdown will have a volume reduction of 30% by week 4 Date Initiated: 04/26/2019 Target Resolution Date: 05/24/2019 Goal Status: Active Ulcer/skin breakdown will have a volume reduction of 50% by week 8 Date Initiated: 04/26/2019 Target Resolution Date: 06/21/2019 Goal Status: Active Interventions: Assess patient/caregiver ability to obtain necessary supplies Assess ulceration(s) every visit Notes: Electronic Signature(s) Signed: 05/14/2019 4:38:11 PM By: Gretta Cool, BSN, RN, CWS, Kim RN, BSN Entered By: Gretta Cool, BSN, RN, CWS, Kim on 05/12/2019 08:41:55 Benjamin [name], [name] ([id number]) -------------------------------------------------------------------------------- Pain Assessment Details Patient Name: Benjamin [name], [name] Date of Service: 05/12/2019 8:15 AM Medical Record Number: [id number] Patient Account Number: [id number] Date of Birth/Sex: 07/20/1951 (68 y.o. M) Treating RN: Montey Hora Primary Care  Johny Pitstick: Salome Holmes Other Clinician: Referring Trampas Stettner: Salome Holmes Treating Jadwiga Faidley/Extender: Tito Dine in Treatment: 2 Active Problems Location of Pain Severity and Description of Pain Patient Has Paino Yes Site Locations Pain Location: Pain in Ulcers With Dressing Change: Yes Duration of the Pain. Constant / Intermittento Intermittent Pain Management and Medication Current Pain Management: Electronic Signature(s) Signed: 05/13/2019 5:10:09 PM By: Montey Hora Entered By: Montey Hora on 05/12/2019 08:25:51 Benjamin [name], [name] ([id number]) -------------------------------------------------------------------------------- Patient/Caregiver Education Details Patient Name: Benjamin [name], Edis S. Date of Service: 05/12/2019 8:15 AM Medical Record Number: [id number] Patient Account Number: [id number] Date of Birth/Gender: 05/31/1951 (68 y.o. M) Treating RN: Cornell Barman Primary Care Physician: Salome Holmes Other Clinician: Referring Physician: Salome Holmes Treating Physician/Extender: Tito Dine in Treatment: 2 Education Assessment Education Provided To: Patient Education Topics Provided Venous: Controlling Swelling with Multilayered Compression Wraps, Other: call if gets wet or slides down more than an Handouts: inch Methods: Demonstration, Explain/Verbal Responses: State content correctly Electronic Signature(s) Signed: 05/14/2019 4:38:11 PM  By: Gretta Cool, BSN, RN, CWS, Kim RN, BSN Entered By: Gretta Cool, BSN, RN, CWS, Kim on 05/12/2019 08:53:02 Benjamin [name], Benjamin [name] Loron ([id number]) -------------------------------------------------------------------------------- Wound Assessment Details Patient Name: Conery, Marina S. Date of Service: 05/12/2019 8:15 AM Medical Record Number: [id number] Patient Account Number: [id number] Date of Birth/Sex: 1951-06-26 (68 y.o. M) Treating RN: Montey Hora Primary Care Corrie Brannen: Salome Holmes Other  Clinician: Referring Tyon Cerasoli: Salome Holmes Treating Lizbeth Feijoo/Extender: Tito Dine in Treatment: 2 Wound Status Wound Number: 1 Primary Etiology: Trauma, Other Wound Location: Left Lower Leg - Anterior Secondary Etiology: Venous Leg Ulcer Wounding Event: Trauma Wound Status: Open Date Acquired: 03/11/2019 Weeks Of Treatment: 2 Clustered Wound: No Photos Wound Measurements Length: (cm) 3.1 % Reduction i Width: (cm) 1.5 % Reduction i Depth: (cm) 1 Epithelializa Area: (cm) 3.652 Tunneling: Volume: (cm) 3.652 Position Maximum Di n Area: -3.3% n Volume: -416.5% tion: Small (1-33%) Yes (o'clock): 12 stance: (cm) 2.6 Undermining: No Wound Description Classification: Partial Thickness Foul Odor Af Wound Margin: Flat and Intact Slough/Fibri Exudate Amount: Large Exudate Type: Serous Exudate Color: amber ter Cleansing: No no Yes Wound Bed Granulation Amount: Large (67-100%) Exposed Structure Granulation Quality: Red Fascia Exposed: No Necrotic Amount: Small (1-33%) Fat Layer (Subcutaneous Tissue) Exposed: Yes Necrotic Quality: Adherent Slough Tendon Exposed: No Muscle Exposed: No Joint Exposed: No Bone Exposed: No Bolds, Tallie S. ([id number]) Treatment Notes Wound #1 (Left, Anterior Lower Leg) Notes Silver alginate packed, xtrasorb, 3 layer wrap Electronic Signature(s) Signed: 05/13/2019 5:10:09 PM By: Montey Hora Entered By: Montey Hora on 05/12/2019 08:35:40 Satchell, Lorcan S. ([id number]) -------------------------------------------------------------------------------- Vitals Details Patient Name: Gargan, Esa S. Date of Service: 05/12/2019 8:15 AM Medical Record Number: [id number] Patient Account Number: [id number] Date of Birth/Sex: Apr 17, 1951 (68 y.o. M) Treating RN: Montey Hora Primary Care Stepheni Cameron: Salome Holmes Other Clinician: Referring Chamar Broughton: Salome Holmes Treating Betsaida [place]/Extender: Tito Dine in  Treatment: 2 Vital Signs Time Taken: 08:25 Temperature (F): 98.2 Height (in): 68 Pulse (bpm): 85 Weight (lbs): 275 Respiratory Rate (breaths/min): 16 Body Mass Index (BMI): 41.8 Blood Pressure (mmHg): 153/63 Reference Range: 80 - 120 mg / dl Electronic Signature(s) Signed: 05/13/2019 5:10:09 PM By: Montey Hora Entered By: Montey Hora on 05/12/2019 08:26:19

## 2019-05-19 ENCOUNTER — Encounter: Payer: Medicare Other | Attending: Internal Medicine | Admitting: Internal Medicine

## 2019-05-19 ENCOUNTER — Other Ambulatory Visit: Payer: Self-pay

## 2019-05-19 DIAGNOSIS — L97822 Non-pressure chronic ulcer of other part of left lower leg with fat layer exposed: Secondary | ICD-10-CM | POA: Diagnosis not present

## 2019-05-19 DIAGNOSIS — I890 Lymphedema, not elsewhere classified: Secondary | ICD-10-CM | POA: Diagnosis not present

## 2019-05-19 DIAGNOSIS — I872 Venous insufficiency (chronic) (peripheral): Secondary | ICD-10-CM | POA: Diagnosis not present

## 2019-05-25 NOTE — Progress Notes (Signed)
Benjamin [name] ([id number]) Visit Report for 05/19/2019 HPI Details Patient Name: Benjamin, [name]. Date of Service: 05/19/2019 9:00 AM Medical Record Number: [id number] Patient Account Number: [id number] Date of Birth/Sex: Aug 02, 1951 (68 y.o. M) Treating RN: Benjamin [name] Primary Care Provider: Salome [name] Other Clinician: Referring Provider: Salome [name] Treating Provider/Extender: Benjamin [name] in Treatment: 3 History of Present Illness HPI Description: 04/26/19 on evaluation today patient presents for initial evaluation our clinic concerning an issue that he has been having with the wound on the left anterior lower extremity which occurred in April 2020 as a result of a traumatic injury when he was working on a tree stump in his yard He states that the tree stump flew back and hit him in the shin causing a significant amount of bruising and abrasion even through his jeans. He did end up in the hospital for time where he was given antibiotics fortunately he's done with those at this point although he still appears to have a significant wound that drains quite a bit especially if he pushes at the 12 o'clock location he will have a significant amount of yellow drainage not really purulent noted. He is not have any significant pain at this time which is good news. He does state that he occasionally has gotten tissue as well as blood clots out of the wound. 05/05/2019; this is a patient who suffered a tree stump laceration injury on the left anterior mid tibia area. He was seen and admitted to clinic last week. Use silver alginate packing and a Kerlix Coban compression. I believe he took this off at some point and his wife put a gauze wrap on him. He complained about the wrap falling down. 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 His ABI in our clinic was noncompressible. He has 3 more days of Keflex which we apparently gave him last week. I do not see any cultures 6/24; traumatic wound on the left anterior mid tibia likely in the setting of chronic venous insufficiency with some degree of secondary lymphedema. He tolerated the 3 layer wrap I put him on him last week and the superior tunnel is gone from over 6 cm to 2.6 cm today. What I can see if the tissue actually looks quite good. We would using silver alginate as the primary dressing 7/1; traumatic wound on the left anterior mid tibia in the setting of chronic venous insufficiency mild degree of lymphedema. He is tolerating the 3 layer compression. He has a tunnel superiorly that continues to come in nicely. Otherwise the surface area of the wound looks about the same. Slight hyper granulation we have been using silver alginate Electronic Signature(s) Signed: 05/19/2019 6:06:19 PM By: Benjamin Ham [name] Entered By: Benjamin [name] on 05/19/2019 10:16:38 [name], [name] ([id number]) -------------------------------------------------------------------------------- CHEM CAUT GRANULATION TISS Details Patient Name: [name], [name] Date of Service: 05/19/2019 9:00 AM Medical Record Number: [id number] Patient Account Number: [id number] Date of Birth/Sex: 12/10/1950 (68 y.o. M) Treating RN: Benjamin [name] Primary Care Provider: Salome [name] Other Clinician: Referring Provider: Salome [name] Treating Provider/Extender: Benjamin [name] in Treatment: 3 Procedure Performed for: Wound #1 Left,Anterior Lower Leg Performed By: Physician Benjamin [name] Post Procedure Diagnosis Same as Pre-procedure Notes 2 nitrate sticks used Electronic Signature(s) Signed:  05/19/2019 6:06:19 PM By: Benjamin Ham [name] Entered By: Benjamin [name] on 05/19/2019 09:58:10 [name], [name]  ([id number]) -------------------------------------------------------------------------------- Physical Exam Details Patient Name: [name], [name] Date of Service: 05/19/2019 9:00 AM Medical Record Number: [id number] Patient Account Number: [id number] Date of Birth/Sex: September 09, 1951 (68 y.o. M) Treating RN: Benjamin [name] Primary Care Provider: Salome [name] Other Clinician: Referring Provider: Salome [name] Treating Provider/Extender: Benjamin [name] in Treatment: 3 Constitutional Sitting or standing Blood Pressure is within target range for patient.. Pulse regular and within target range for patient.Marland Kitchen Respirations regular, non-labored and within target range.. Temperature is normal and within the target range for the patient.Marland Kitchen appears in no distress. Cardiovascular Pedal pulses are palpable on the left. Notes Wound exam; patient has a clean looking wound over the left anterior tibia. Hyper granulation in this wound medially. The tunnel superiorly appears to have come in in terms of measurements. Currently at 1.5 cm today. There is no evidence of infection. I used silver nitrate to cauterize hyper granulation on the medial aspect of the wound. Electronic Signature(s) Signed: 05/19/2019 6:06:19 PM By: Benjamin Ham [name] Entered By: Benjamin [name] on 05/19/2019 10:17:47 [name], Benjamin Loron ([id number]) -------------------------------------------------------------------------------- Physician Orders Details Patient Name: [name], [name] Date of Service: 05/19/2019 9:00 AM Medical Record Number: [id number] Patient Account Number: [id number] Date of Birth/Sex: [birth year] (68 y.o. M) Treating RN: Benjamin [name] Primary Care Provider: Salome [name] Other Clinician: Referring Provider: Salome [name] Treating Provider/Extender: Benjamin [name] in Treatment: 3 Verbal / Phone Orders: No Diagnosis Coding Wound Cleansing Wound #1 Left,Anterior Lower Leg o Clean wound with  Normal Saline. Anesthetic (add to Medication List) Wound #1 Left,Anterior Lower Leg o Topical Lidocaine 4% cream applied to wound bed prior to debridement (In Clinic Only). Primary Wound Dressing Wound #1 Left,Anterior Lower Leg o Silver Alginate - Packed into tunnel at 12:00. Secondary Dressing Wound #1 Left,Anterior Lower Leg o XtraSorb Follow-up Appointments Wound #1 Left,Anterior Lower Leg o Return Appointment in 1 week. Edema Control Wound #1 Left,Anterior Lower Leg o 3 Layer Compression System - Left Lower Extremity Electronic Signature(s) Signed: 05/19/2019 6:06:19 PM By: Benjamin Ham [name] Signed: 05/24/2019 5:43:16 PM By: Gretta Cool, BSN, RN, CWS, Kim RN, BSN Entered By: Gretta Cool, BSN, RN, CWS, Kim on 05/19/2019 09:50:30 Rings, Benjamin Loron ([id number]) -------------------------------------------------------------------------------- Problem List Details Patient Name: [name], [name] Date of Service: 05/19/2019 9:00 AM Medical Record Number: [id number] Patient Account Number: [id number] Date of Birth/Sex: 07-21-1951 (68 y.o. M) Treating RN: Benjamin [name] Primary Care Provider: Salome [name] Other Clinician: Referring Provider: Salome [name] Treating Provider/Extender: Benjamin [name] in Treatment: 3 Active Problems ICD-10 Evaluated Encounter Code Description Active Date Today Diagnosis I87.2 Venous insufficiency (chronic) (peripheral) 04/26/2019 No Yes L97.822 Non-pressure chronic ulcer of other part of left lower leg with 04/26/2019 No Yes fat layer exposed I89.0 Lymphedema, not elsewhere classified 05/05/2019 No Yes Inactive Problems Resolved Problems Electronic Signature(s) Signed: 05/19/2019 6:06:19 PM By: Benjamin Ham [name] Entered By: Benjamin [name] on 05/19/2019 09:57:45 Vana, Jacy S. ([id number]) -------------------------------------------------------------------------------- Progress Note Details Patient Name: [name], [name] Date of  Service: 05/19/2019 9:00 AM Medical Record Number: [id number] Patient Account Number: [id number] Date of Birth/Sex: Aug 16, 1951 (68 y.o. M) Treating RN: Benjamin [name] Primary Care Provider: Salome [name] Other Clinician: Referring Provider: Salome [name] Treating Provider/Extender: Benjamin [name] in Treatment: 3 Subjective History of Present Illness (HPI) 04/26/19 on evaluation today patient presents for initial evaluation our clinic concerning an issue that he has been having with the wound  on the left anterior lower extremity which occurred in April 2020 as a result of a traumatic injury when he was working on a tree stump in his yard He states that the tree stump flew back and hit him in the shin causing a significant amount of bruising and abrasion even through his jeans. He did end up in the hospital for time where he was given antibiotics fortunately he's done with those at this point although he still appears to have a significant wound that drains quite a bit especially if he pushes at the 12 o'clock location he will have a significant amount of yellow drainage not really purulent noted. He is not have any significant pain at this time which is good news. He does state that he occasionally has gotten tissue as well as blood clots out of the wound. 05/05/2019; this is a patient who suffered a tree stump laceration injury on the left anterior mid tibia area. He was seen and admitted to clinic last week. Use silver alginate packing and a Kerlix Coban compression. I believe he took this off at some point and his wife put a gauze wrap on him. He complained about the wrap falling down. 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 His ABI in our clinic was noncompressible. He has 3 more days of Keflex which  we apparently gave him last week. I do not see any cultures 6/24; traumatic wound on the left anterior mid tibia likely in the setting of chronic venous insufficiency with some degree of secondary lymphedema. He tolerated the 3 layer wrap I put him on him last week and the superior tunnel is gone from over 6 cm to 2.6 cm today. What I can see if the tissue actually looks quite good. We would using silver alginate as the primary dressing 7/1; traumatic wound on the left anterior mid tibia in the setting of chronic venous insufficiency mild degree of lymphedema. He is tolerating the 3 layer compression. He has a tunnel superiorly that continues to come in nicely. Otherwise the surface area of the wound looks about the same. Slight hyper granulation we have been using silver alginate Objective Constitutional Sitting or standing Blood Pressure is within target range for patient.. Pulse regular and within target range for patient.Marland Kitchen Respirations regular, non-labored and within target range.. Temperature is normal and within the target range for the patient.Marland Kitchen appears in no distress. Vitals Time Taken: 9:20 AM, Height: 68 in, Weight: 275 lbs, BMI: 41.8, Temperature: 98.1 F, Pulse: 78 bpm, Respiratory Rate: 18 breaths/min, Blood Pressure: 123/68 mmHg. Cardiovascular Pedal pulses are palpable on the left. Benjamin [name], Benjamin [name] ([id number]) General Notes: Wound exam; patient has a clean looking wound over the left anterior tibia. Hyper granulation in this wound medially. The tunnel superiorly appears to have come in in terms of measurements. Currently at 1.5 cm today. There is no evidence of infection. I used silver nitrate to cauterize hyper granulation on the medial aspect of the wound. Integumentary (Hair, Skin) Wound #1 status is Open. Original cause of wound was Trauma. The wound is located on the Left,Anterior Lower Leg. The wound measures 2.7cm length x 1cm width x 0.8cm depth; 2.121cm^2 area and  1.696cm^3 volume. There is Fat Layer (Subcutaneous Tissue) Exposed exposed. There is no undermining noted,  however, there is tunneling at 12:00 with a maximum distance of 1.4cm. There is a large amount of serous drainage noted. The wound margin is flat and intact. There is large (67-100%) red granulation within the wound bed. There is a small (1-33%) amount of necrotic tissue within the wound bed including Adherent Slough. Assessment Active Problems ICD-10 Venous insufficiency (chronic) (peripheral) Non-pressure chronic ulcer of other part of left lower leg with fat layer exposed Lymphedema, not elsewhere classified Procedures Wound #1 Pre-procedure diagnosis of Wound #1 is a Trauma, Other located on the Left,Anterior Lower Leg . An CHEM CAUT GRANULATION TISS procedure was performed by Benjamin [name]. Post procedure Diagnosis Wound #1: Same as Pre-Procedure Notes: 2 nitrate sticks used Plan Wound Cleansing: Wound #1 Left,Anterior Lower Leg: Clean wound with Normal Saline. Anesthetic (add to Medication List): Wound #1 Left,Anterior Lower Leg: Topical Lidocaine 4% cream applied to wound bed prior to debridement (In Clinic Only). Primary Wound Dressing: Wound #1 Left,Anterior Lower Leg: Silver Alginate - Packed into tunnel at 12:00. Secondary Dressing: Wound #1 Left,Anterior Lower Leg: XtraSorb Follow-up Appointments: Wound #1 Left,Anterior Lower Leg: Benjamin [name], Benjamin [name]. ([id number]) Return Appointment in 1 week. Edema Control: Wound #1 Left,Anterior Lower Leg: 3 Layer Compression System - Left Lower Extremity 1. Continue with silver alginate including packing into the tunnel at 12:00 2. If we can get the tunnel to close down then silver collagen would make a lot of sense here. Electronic Signature(s) Signed: 05/19/2019 6:06:19 PM By: Benjamin Ham [name] Entered By: Benjamin [name] on 05/19/2019 10:18:24 Benjamin [name]  ([id number]) -------------------------------------------------------------------------------- SuperBill Details Patient Name: [name], [name] Date of Service: 05/19/2019 Medical Record Number: [id number] Patient Account Number: [id number] Date of Birth/Sex: 12/29/1950 (68 y.o. M) Treating RN: Benjamin [name] Primary Care Provider: Salome [name] Other Clinician: Referring Provider: Salome [name] Treating Provider/Extender: Benjamin [name] in Treatment: 3 Diagnosis Coding ICD-10 Codes Code Description I87.2 Venous insufficiency (chronic) (peripheral) L97.822 Non-pressure chronic ulcer of other part of left lower leg with fat layer exposed I89.0 Lymphedema, not elsewhere classified Facility Procedures CPT4 Code Description: [id number] - CHEM CAUT GRANULATION TISS ICD-10 Diagnosis Description L97.822 Non-pressure chronic ulcer of other part of left lower leg with Modifier: fat layer expos Quantity: 1 ed Physician Procedures CPT4 Code Description: [id number] - WC PHYS CHEM CAUT GRAN TISSUE ICD-10 Diagnosis Description L97.822 Non-pressure chronic ulcer of other part of left lower leg with Modifier: fat layer expos Quantity: 1 ed Electronic Signature(s) Signed: 05/19/2019 6:06:19 PM By: Benjamin Ham [name] Entered By: Benjamin [name] on 05/19/2019 10:18:54

## 2019-05-25 NOTE — Progress Notes (Signed)
Benjamin [name], Benjamin [name] ([id number]) Visit Report for 05/19/2019 Arrival Information Details Patient Name: Benjamin [name], Benjamin [name]. Date of Service: 05/19/2019 9:00 AM Medical Record Number: [id number] Patient Account Number: [id number] Date of Birth/Sex: [birth year] (68 y.o. M) Treating RN: Montey Hora Primary Care Vollie Aaron: Salome Holmes Other Clinician: Referring Amier Hoyt: Salome Holmes Treating Basilia Stuckert/Extender: Tito Dine in Treatment: 3 Visit Information History Since Last Visit Added or deleted any medications: No Patient Arrived: Ambulatory Any new allergies or adverse reactions: No Arrival Time: 09:11 Had a fall or experienced change in No Accompanied By: wife activities of daily living that may affect Transfer Assistance: None risk of falls: Patient Identification Verified: Yes Signs or symptoms of abuse/neglect since last visito No Secondary Verification Process Completed: Yes Hospitalized since last visit: No Implantable device outside of the clinic excluding No cellular tissue based products placed in the center since last visit: Has Dressing in Place as Prescribed: Yes Has Compression in Place as Prescribed: Yes Pain Present Now: No Electronic Signature(s) Signed: 05/19/2019 5:19:05 PM By: Montey Hora Entered By: Montey Hora on 05/19/2019 09:20:44 Benjamin [name], Benjamin [name] ([id number]) -------------------------------------------------------------------------------- Encounter Discharge Information Details Patient Name: Benjamin [name], [name] Date of Service: 05/19/2019 9:00 AM Medical Record Number: [id number] Patient Account Number: [id number] Date of Birth/Sex: 10-Jun-1951 (68 y.o. M) Treating RN: Army Melia Primary Care Tynia Wiers: Salome Holmes Other Clinician: Referring Aija Scarfo: Salome Holmes Treating Alleen Kehm/Extender: Tito Dine in Treatment: 3 Encounter Discharge Information Items Discharge Condition: Stable Ambulatory Status:  Ambulatory Discharge Destination: Home Transportation: Private Auto Accompanied By: wife Schedule Follow-up Appointment: Yes Clinical Summary of Care: Electronic Signature(s) Signed: 05/19/2019 11:08:06 AM By: Army Melia Entered By: Army Melia on 05/19/2019 10:12:42 Depace, Hyman S. ([id number]) -------------------------------------------------------------------------------- Lower Extremity Assessment Details Patient Name: Mester, Chrisotpher S. Date of Service: 05/19/2019 9:00 AM Medical Record Number: [id number] Patient Account Number: [id number] Date of Birth/Sex: May 07, 1951 (68 y.o. M) Treating RN: Montey Hora Primary Care Kiyana Vazguez: Salome Holmes Other Clinician: Referring Manjinder Breau: Salome Holmes Treating Cleven Jansma/Extender: Tito Dine in Treatment: 3 Edema Assessment Assessed: [Left: No] [Right: No] Edema: [Left: Ye] [Right: s] Calf Left: Right: Point of Measurement: 32 cm From Medial Instep 43.5 cm cm Ankle Left: Right: Point of Measurement: 12 cm From Medial Instep 26.5 cm cm Vascular Assessment Pulses: Dorsalis Pedis Palpable: [Left:Yes] Electronic Signature(s) Signed: 05/19/2019 5:19:05 PM By: Montey Hora Entered By: Montey Hora on 05/19/2019 09:22:10 Benjamin [name], [name] ([id number]) -------------------------------------------------------------------------------- Multi Wound Chart Details Patient Name: Benjamin [name], [name] Date of Service: 05/19/2019 9:00 AM Medical Record Number: [id number] Patient Account Number: [id number] Date of Birth/Sex: [birth year] (68 y.o. M) Treating RN: Cornell Barman Primary Care Sueann Brownley: Salome Holmes Other Clinician: Referring Trevonne Nyland: Salome Holmes Treating Kuper Rennels/Extender: Tito Dine in Treatment: 3 Vital Signs Height(in): 68 Pulse(bpm): 78 Weight(lbs): 275 Blood Pressure(mmHg): 123/68 Body Mass Index(BMI): 42 Temperature(F): 98.1 Respiratory Rate 18 (breaths/min): Photos:  [N/A:N/A] Wound Location: Left Lower Leg - Anterior N/A N/A Wounding Event: Trauma N/A N/A Primary Etiology: Trauma, Other N/A N/A Secondary Etiology: Venous Leg Ulcer N/A N/A Date Acquired: 03/11/2019 N/A N/A Weeks of Treatment: 3 N/A N/A Wound Status: Open N/A N/A Measurements L x W x D 2.7x1x0.8 N/A N/A (cm) Area (cm) : 2.121 N/A N/A Volume (cm) : 1.696 N/A N/A % Reduction in Area: 40.00% N/A N/A % Reduction in Volume: -139.90% N/A N/A Position 1 (o'clock): 12 Maximum Distance 1 (cm): 1.4 Tunneling: Yes N/A N/A Classification: Partial Thickness N/A N/A Exudate Amount: Large N/A N/A Exudate Type: Serous N/A  N/A Exudate Color: amber N/A N/A Wound Margin: Flat and Intact N/A N/A Granulation Amount: Large (67-100%) N/A N/A Granulation Quality: Red N/A N/A Necrotic Amount: Small (1-33%) N/A N/A Exposed Structures: Fat Layer (Subcutaneous N/A N/A Tissue) Exposed: Yes Fascia: No Tendon: No Muscle: No Benjamin [name], [name] ([id number]) Joint: No Bone: No Epithelialization: Small (1-33%) N/A N/A Procedures Performed: CHEM CAUT GRANULATION N/A N/A TISS Treatment Notes Electronic Signature(s) Signed: 05/19/2019 6:06:19 PM By: Linton Ham MD Entered By: Linton Ham on 05/19/2019 09:58:00 Pelfrey, Dary Chauncey [name] ([id number]) -------------------------------------------------------------------------------- Multi-Disciplinary Care Plan Details Patient Name: Benjamin Castle. Date of Service: 05/19/2019 9:00 AM Medical Record Number: [id number] Patient Account Number: [id number] Date of Birth/Sex: 04/10/1951 (68 y.o. M) Treating RN: Cornell Barman Primary Care Jadrien Narine: Salome Holmes Other Clinician: Referring Ketih Goodie: Salome Holmes Treating Sahory Nordling/Extender: Tito Dine in Treatment: 3 Active Inactive Abuse / Safety / Falls / Self Care Management Nursing Diagnoses: Potential for falls Goals: Patient will remain injury free related to falls Date Initiated:  04/27/2019 Target Resolution Date: 07/24/2019 Goal Status: Active Interventions: Assess fall risk on admission and as needed Notes: Orientation to the Wound Care Program Nursing Diagnoses: Knowledge deficit related to the wound healing center program Goals: Patient/caregiver will verbalize understanding of the [place] Program Date Initiated: 04/27/2019 Target Resolution Date: 07/24/2019 Goal Status: Active Interventions: Provide education on orientation to the wound center Notes: Pain, Acute or Chronic Nursing Diagnoses: Pain, acute or chronic: actual or potential Goals: Patient will verbalize adequate pain control and receive pain control interventions during procedures as needed Date Initiated: 04/26/2019 Target Resolution Date: 05/28/2019 Goal Status: Active Interventions: Assess comfort goal upon admission MAMORU, TAKESHITA. ([id number]) Complete pain assessment as per visit requirements Notes: Wound/Skin Impairment Nursing Diagnoses: Impaired tissue integrity Goals: Ulcer/skin breakdown will have a volume reduction of 30% by week 4 Date Initiated: 04/26/2019 Target Resolution Date: 05/24/2019 Goal Status: Active Ulcer/skin breakdown will have a volume reduction of 50% by week 8 Date Initiated: 04/26/2019 Target Resolution Date: 06/21/2019 Goal Status: Active Interventions: Assess patient/caregiver ability to obtain necessary supplies Assess ulceration(s) every visit Notes: Electronic Signature(s) Signed: 05/24/2019 5:43:16 PM By: Gretta Cool, BSN, RN, CWS, Kim RN, BSN Entered By: Gretta Cool, BSN, RN, CWS, Kim on 05/19/2019 09:48:58 Benjamin [name], Benjamin [name] ([id number]) -------------------------------------------------------------------------------- Pain Assessment Details Patient Name: Benjamin [name], [name] Date of Service: 05/19/2019 9:00 AM Medical Record Number: [id number] Patient Account Number: [id number] Date of Birth/Sex: [birth year] (68 y.o. M) Treating RN: Montey Hora Primary Care Dionta Larke: Salome Holmes Other Clinician: Referring Treyshaun Keatts: Salome Holmes Treating Derec Mozingo/Extender: Tito Dine in Treatment: 3 Active Problems Location of Pain Severity and Description of Pain Patient Has Paino No Site Locations Pain Management and Medication Current Pain Management: Electronic Signature(s) Signed: 05/19/2019 5:19:05 PM By: Montey Hora Entered By: Montey Hora on 05/19/2019 09:20:51 Benjamin [name], Benjamin [name] ([id number]) -------------------------------------------------------------------------------- Patient/Caregiver Education Details Patient Name: Benjamin [name], Benjamin [name]. Date of Service: 05/19/2019 9:00 AM Medical Record Number: [id number] Patient Account Number: [id number] Date of Birth/Gender: [birth year] (68 y.o. M) Treating RN: Cornell Barman Primary Care Physician: Salome Holmes Other Clinician: Referring Physician: Salome Holmes Treating Physician/Extender: Tito Dine in Treatment: 3 Education Assessment Education Provided To: Patient Education Topics Provided Venous: Handouts: Controlling Swelling with Multilayered Compression Wraps Methods: Demonstration, Explain/Verbal Responses: State content correctly Wound/Skin Impairment: Handouts: Caring for Your Ulcer, Other: continue wound care as prescribed Methods: Demonstration, Explain/Verbal Responses: State content correctly Electronic Signature(s) Signed: 05/24/2019 5:43:16 PM By: Gretta Cool, BSN, RN, CWS, Kim RN, BSN Entered  By: Gretta Cool, BSN, RN, CWS, Kim on 05/19/2019 09:51:22 Benjamin [name], Benjamin [name] ([id number]) -------------------------------------------------------------------------------- Wound Assessment Details Patient Name: Nofsinger, Daune S. Date of Service: 05/19/2019 9:00 AM Medical Record Number: [id number] Patient Account Number: [id number] Date of Birth/Sex: 1950-12-12 (68 y.o. M) Treating RN: Montey Hora Primary Care Japleen Tornow: Salome Holmes Other  Clinician: Referring Fedra Lanter: Salome Holmes Treating Kaden Dunkel/Extender: Tito Dine in Treatment: 3 Wound Status Wound Number: 1 Primary Etiology: Trauma, Other Wound Location: Left Lower Leg - Anterior Secondary Etiology: Venous Leg Ulcer Wounding Event: Trauma Wound Status: Open Date Acquired: 03/11/2019 Weeks Of Treatment: 3 Clustered Wound: No Photos Wound Measurements Length: (cm) 2.7 % Reduction i Width: (cm) 1 % Reduction i Depth: (cm) 0.8 Epithelializa Area: (cm) 2.121 Tunneling: Volume: (cm) 1.696 Position Maximum Di n Area: 40% n Volume: -139.9% tion: Small (1-33%) Yes (o'clock): 12 stance: (cm) 1.4 Undermining: No Wound Description Classification: Partial Thickness Foul Odor Af Wound Margin: Flat and Intact Slough/Fibri Exudate Amount: Large Exudate Type: Serous Exudate Color: amber ter Cleansing: No no Yes Wound Bed Granulation Amount: Large (67-100%) Exposed Structure Granulation Quality: Red Fascia Exposed: No Necrotic Amount: Small (1-33%) Fat Layer (Subcutaneous Tissue) Exposed: Yes Necrotic Quality: Adherent Slough Tendon Exposed: No Muscle Exposed: No Joint Exposed: No Bone Exposed: No Astarita, Benjamin [name] S. ([id number]) Treatment Notes Wound #1 (Left, Anterior Lower Leg) Notes Silver alginate packed, xtrasorb, 3 layer wrap Electronic Signature(s) Signed: 05/19/2019 5:19:05 PM By: Montey Hora Entered By: Montey Hora on 05/19/2019 09:27:40 Bowerman, Naresh S. ([id number]) -------------------------------------------------------------------------------- Vitals Details Patient Name: Brouse, Logun S. Date of Service: 05/19/2019 9:00 AM Medical Record Number: [id number] Patient Account Number: [id number] Date of Birth/Sex: 1951-05-12 (68 y.o. M) Treating RN: Montey Hora Primary Care Kyros Salzwedel: Salome Holmes Other Clinician: Referring Shandie Bertz: Salome Holmes Treating Houston Zapien/Extender: Tito Dine in  Treatment: 3 Vital Signs Time Taken: 09:20 Temperature (F): 98.1 Height (in): 68 Pulse (bpm): 78 Weight (lbs): 275 Respiratory Rate (breaths/min): 18 Body Mass Index (BMI): 41.8 Blood Pressure (mmHg): 123/68 Reference Range: 80 - 120 mg / dl Electronic Signature(s) Signed: 05/19/2019 5:19:05 PM By: Montey Hora Entered By: Montey Hora on 05/19/2019 09:21:15

## 2019-05-26 ENCOUNTER — Encounter: Payer: Medicare Other | Admitting: Internal Medicine

## 2019-05-26 ENCOUNTER — Other Ambulatory Visit: Payer: Self-pay

## 2019-05-26 DIAGNOSIS — L97822 Non-pressure chronic ulcer of other part of left lower leg with fat layer exposed: Secondary | ICD-10-CM | POA: Diagnosis not present

## 2019-05-27 NOTE — Progress Notes (Signed)
DARONTE, SHOSTAK ([id number]) Visit Report for 05/26/2019 Arrival Information Details Patient Name: Benjamin [name], Benjamin [name]. Date of Service: 05/26/2019 8:15 AM Medical Record Number: [id number] Patient Account Number: [id number] Date of Birth/Sex: 1950-12-05 (68 y.o. M) Treating RN: Harold Barban Primary Care Ibrohim Simmers: Salome Holmes Other Clinician: Referring Steve Gregg: Salome Holmes Treating Lesley Galentine/Extender: Tito Dine in Treatment: 4 Visit Information History Since Last Visit Added or deleted any medications: No Patient Arrived: Ambulatory Any new allergies or adverse reactions: No Arrival Time: 08:21 Had a fall or experienced change in No Accompanied By: wife activities of daily living that may affect Transfer Assistance: None risk of falls: Patient Identification Verified: Yes Signs or symptoms of abuse/neglect since last visito No Secondary Verification Process Completed: Yes Hospitalized since last visit: No Has Dressing in Place as Prescribed: Yes Has Compression in Place as Prescribed: Yes Pain Present Now: No Electronic Signature(s) Signed: 05/26/2019 4:22:05 PM By: Harold Barban Entered By: Harold Barban on 05/26/2019 08:22:22 Reeves, Benjamin [name] ([id number]) -------------------------------------------------------------------------------- Encounter Discharge Information Details Patient Name: KIRKSEY, [name] Date of Service: 05/26/2019 8:15 AM Medical Record Number: [id number] Patient Account Number: [id number] Date of Birth/Sex: 04/27/1951 (68 y.o. M) Treating RN: Army Melia Primary Care Jules Baty: Salome Holmes Other Clinician: Referring Ellenora Talton: Salome Holmes Treating Beverly Suriano/Extender: Tito Dine in Treatment: 4 Encounter Discharge Information Items Discharge Condition: Stable Ambulatory Status: Ambulatory Discharge Destination: Home Transportation: Private Auto Accompanied By: wife Schedule Follow-up Appointment:  Yes Clinical Summary of Care: Electronic Signature(s) Signed: 05/26/2019 9:06:30 AM By: Army Melia Entered By: Army Melia on 05/26/2019 08:52:07 Chenoweth, [name] ([id number]) -------------------------------------------------------------------------------- Lower Extremity Assessment Details Patient Name: Spivack, [name] Date of Service: 05/26/2019 8:15 AM Medical Record Number: [id number] Patient Account Number: [id number] Date of Birth/Sex: Aug 05, 1951 (68 y.o. M) Treating RN: Harold Barban Primary Care Sabrena Gavitt: Salome Holmes Other Clinician: Referring Romel Dumond: Salome Holmes Treating Lindsey Hommel/Extender: Tito Dine in Treatment: 4 Edema Assessment Assessed: [Left: No] [Right: No] [Left: Edema] [Right: :] Calf Left: Right: Point of Measurement: 32 cm From Medial Instep 44 cm cm Ankle Left: Right: Point of Measurement: 12 cm From Medial Instep 26 cm cm Vascular Assessment Pulses: Dorsalis Pedis Palpable: [Left:Yes] Posterior Tibial Palpable: [Left:Yes] Electronic Signature(s) Signed: 05/26/2019 4:22:05 PM By: Harold Barban Entered By: Harold Barban on 05/26/2019 08:27:54 Ringenberg, [name] ([id number]) -------------------------------------------------------------------------------- Multi Wound Chart Details Patient Name: Quebedeaux, [name] Date of Service: 05/26/2019 8:15 AM Medical Record Number: [id number] Patient Account Number: [id number] Date of Birth/Sex: Jul 01, 1951 (68 y.o. M) Treating RN: Cornell Barman Primary Care Musa Rewerts: Salome Holmes Other Clinician: Referring Jannifer Fischler: Salome Holmes Treating Tykisha Areola/Extender: Tito Dine in Treatment: 4 Vital Signs Height(in): 68 Pulse(bpm): 71 Weight(lbs): 275 Blood Pressure(mmHg): 136/55 Body Mass Index(BMI): 42 Temperature(F): 97.8 Respiratory Rate 18 (breaths/min): Photos: [N/A:N/A] Wound Location: Left Lower Leg - Anterior N/A N/A Wounding Event: Trauma N/A N/A Primary  Etiology: Trauma, Other N/A N/A Secondary Etiology: Venous Leg Ulcer N/A N/A Date Acquired: 03/11/2019 N/A N/A Weeks of Treatment: 4 N/A N/A Wound Status: Open N/A N/A Measurements L x W x D 2x0.9x0.6 N/A N/A (cm) Area (cm) : 1.414 N/A N/A Volume (cm) : 0.848 N/A N/A % Reduction in Area: 60.00% N/A N/A % Reduction in Volume: -19.90% N/A N/A Position 1 (o'clock): 12 Maximum Distance 1 (cm): 1.5 Tunneling: Yes N/A N/A Classification: Partial Thickness N/A N/A Exudate Amount: Large N/A N/A Exudate Type: Serous N/A N/A Exudate Color: amber N/A N/A Wound Margin: Flat and Intact N/A N/A Granulation Amount: Large (  67-100%) N/A N/A Granulation Quality: Red N/A N/A Necrotic Amount: Small (1-33%) N/A N/A Exposed Structures: Fat Layer (Subcutaneous N/A N/A Tissue) Exposed: Yes Fascia: No Tendon: No Muscle: No Rudd, [name] ([id number]) Joint: No Bone: No Epithelialization: Small (1-33%) N/A N/A Procedures Performed: CHEM CAUT GRANULATION N/A N/A TISS Treatment Notes Electronic Signature(s) Signed: 05/26/2019 5:45:20 PM By: Linton Ham MD Entered By: Linton Ham on 05/26/2019 08:39:45 Nierman, Benjamin [name] ([id number]) -------------------------------------------------------------------------------- [place] Details Patient Name: RINN, Benjamin [name]. Date of Service: 05/26/2019 8:15 AM Medical Record Number: [id number] Patient Account Number: [id number] Date of Birth/Sex: 04-Jul-1951 (68 y.o. M) Treating RN: Cornell Barman Primary Care Lashante Fryberger: Salome Holmes Other Clinician: Referring Jenness Stemler: Salome Holmes Treating Mandisa Persinger/Extender: Tito Dine in Treatment: 4 Active Inactive Abuse / Safety / Falls / Self Care Management Nursing Diagnoses: Potential for falls Goals: Patient will remain injury free related to falls Date Initiated: 04/27/2019 Target Resolution Date: 07/24/2019 Goal Status: Active Interventions: Assess fall risk on  admission and as needed Notes: Orientation to the Wound Care Program Nursing Diagnoses: Knowledge deficit related to the wound healing center program Goals: Patient/caregiver will verbalize understanding of the [place] Program Date Initiated: 04/27/2019 Target Resolution Date: 07/24/2019 Goal Status: Active Interventions: Provide education on orientation to the wound center Notes: Pain, Acute or Chronic Nursing Diagnoses: Pain, acute or chronic: actual or potential Goals: Patient will verbalize adequate pain control and receive pain control interventions during procedures as needed Date Initiated: 04/26/2019 Target Resolution Date: 05/28/2019 Goal Status: Active Interventions: Assess comfort goal upon admission SAXON, BARICH. ([id number]) Complete pain assessment as per visit requirements Notes: Wound/Skin Impairment Nursing Diagnoses: Impaired tissue integrity Goals: Ulcer/skin breakdown will have a volume reduction of 30% by week 4 Date Initiated: 04/26/2019 Target Resolution Date: 05/24/2019 Goal Status: Active Ulcer/skin breakdown will have a volume reduction of 50% by week 8 Date Initiated: 04/26/2019 Target Resolution Date: 06/21/2019 Goal Status: Active Interventions: Assess patient/caregiver ability to obtain necessary supplies Assess ulceration(s) every visit Notes: Electronic Signature(s) Signed: 05/26/2019 5:54:22 PM By: Gretta Cool, BSN, RN, CWS, Kim RN, BSN Entered By: Gretta Cool, BSN, RN, CWS, Kim on 05/26/2019 08:32:58 Dangler, Benjamin [name] ([id number]) -------------------------------------------------------------------------------- Pain Assessment Details Patient Name: GAIL, [name] Date of Service: 05/26/2019 8:15 AM Medical Record Number: [id number] Patient Account Number: [id number] Date of Birth/Sex: June 21, 1951 (68 y.o. M) Treating RN: Harold Barban Primary Care Emerie Vanderkolk: Salome Holmes Other Clinician: Referring Tayquan Gassman: Salome Holmes Treating  Lula Michaux/Extender: Tito Dine in Treatment: 4 Active Problems Location of Pain Severity and Description of Pain Patient Has Paino No Site Locations Pain Management and Medication Current Pain Management: Electronic Signature(s) Signed: 05/26/2019 4:22:05 PM By: Harold Barban Entered By: Harold Barban on 05/26/2019 08:22:31 Devereux, Benjamin [name] ([id number]) -------------------------------------------------------------------------------- Patient/Caregiver Education Details Patient Name: [name], Benjamin [name]. Date of Service: 05/26/2019 8:15 AM Medical Record Number: [id number] Patient Account Number: [id number] Date of Birth/Gender: 1951/05/14 (68 y.o. M) Treating RN: Cornell Barman Primary Care Physician: Salome Holmes Other Clinician: Referring Physician: Salome Holmes Treating Physician/Extender: Tito Dine in Treatment: 4 Education Assessment Education Provided To: Patient Education Topics Provided Venous: Handouts: Controlling Swelling with Multilayered Compression Wraps Methods: Demonstration, Explain/Verbal Responses: State content correctly Wound Debridement: Wound/Skin Impairment: Handouts: Caring for Your Ulcer Methods: Demonstration, Explain/Verbal Responses: State content correctly Electronic Signature(s) Signed: 05/26/2019 5:54:22 PM By: Gretta Cool, BSN, RN, CWS, Kim RN, BSN Entered By: Gretta Cool, BSN, RN, CWS, Kim on 05/26/2019 08:37:08 Kinderman, Benjamin [name] ([id number]) -------------------------------------------------------------------------------- Wound Assessment Details Patient Name: PROVINCE,  [name] Date of Service: 05/26/2019 8:15 AM Medical Record Number: [id number] Patient Account Number: [id number] Date of Birth/Sex: June 17, 1951 (68 y.o. M) Treating RN: Harold Barban Primary Care Omair Dettmer: Salome Holmes Other Clinician: Referring Navdeep Fessenden: Salome Holmes Treating Sherolyn Trettin/Extender: Tito Dine in Treatment: 4 Wound  Status Wound Number: 1 Primary Etiology: Trauma, Other Wound Location: Left Lower Leg - Anterior Secondary Etiology: Venous Leg Ulcer Wounding Event: Trauma Wound Status: Open Date Acquired: 03/11/2019 Weeks Of Treatment: 4 Clustered Wound: No Photos Wound Measurements Length: (cm) 2 % Reduction i Width: (cm) 0.9 % Reduction i Depth: (cm) 0.6 Epithelializa Area: (cm) 1.414 Tunneling: Volume: (cm) 0.848 Position Maximum Di n Area: 60% n Volume: -19.9% tion: Small (1-33%) Yes (o'clock): 12 stance: (cm) 1.5 Undermining: No Wound Description Classification: Partial Thickness Foul Odor Af Wound Margin: Flat and Intact Slough/Fibri Exudate Amount: Large Exudate Type: Serous Exudate Color: amber ter Cleansing: No no Yes Wound Bed Granulation Amount: Large (67-100%) Exposed Structure Granulation Quality: Red Fascia Exposed: No Necrotic Amount: Small (1-33%) Fat Layer (Subcutaneous Tissue) Exposed: Yes Necrotic Quality: Adherent Slough Tendon Exposed: No Muscle Exposed: No Joint Exposed: No Bone Exposed: No Giammarco, [name] ([id number]) Treatment Notes Wound #1 (Left, Anterior Lower Leg) Notes prisma packed, xtrasorb, 3 layer wrap Electronic Signature(s) Signed: 05/26/2019 4:22:05 PM By: Harold Barban Signed: 05/26/2019 5:54:22 PM By: Gretta Cool, BSN, RN, CWS, Kim RN, BSN Entered By: Gretta Cool, BSN, RN, CWS, Kim on 05/26/2019 08:32:41 Sangha, Benjamin [name] ([id number]) -------------------------------------------------------------------------------- Vitals Details Patient Name: Mcconaghy, [name] Date of Service: 05/26/2019 8:15 AM Medical Record Number: [id number] Patient Account Number: [id number] Date of Birth/Sex: 1951-02-06 (68 y.o. M) Treating RN: Harold Barban Primary Care Jazel Nimmons: Salome Holmes Other Clinician: Referring Mayson Sterbenz: Salome Holmes Treating Lothar Prehn/Extender: Tito Dine in Treatment: 4 Vital Signs Time Taken: 08:20 Temperature  (F): 97.8 Height (in): 68 Pulse (bpm): 71 Weight (lbs): 275 Respiratory Rate (breaths/min): 18 Body Mass Index (BMI): 41.8 Blood Pressure (mmHg): 136/55 Reference Range: 80 - 120 mg / dl Electronic Signature(s) Signed: 05/26/2019 4:22:05 PM By: Harold Barban Entered By: Harold Barban on 05/26/2019 08:22:54

## 2019-05-27 NOTE — Progress Notes (Signed)
MARTINE, TRAGESER ([id number]) Visit Report for 05/26/2019 HPI Details Patient Name: Benjamin [name], Benjamin [name]. Date of Service: 05/26/2019 8:15 AM Medical Record Number: [id number] Patient Account Number: [id number] Date of Birth/Sex: 03-27-1951 (68 y.o. M) Treating RN: Cornell Barman Primary Care Provider: Salome Holmes Other Clinician: Referring Provider: Salome Holmes Treating Provider/Extender: Tito Dine in Treatment: 4 History of Present Illness HPI Description: 04/26/19 on evaluation today patient presents for initial evaluation our clinic concerning an issue that he has been having with the wound on the left anterior lower extremity which occurred in April 2020 as a result of a traumatic injury when he was working on a tree stump in his yard He states that the tree stump flew back and hit him in the shin causing a significant amount of bruising and abrasion even through his jeans. He did end up in the hospital for time where he was given antibiotics fortunately he's done with those at this point although he still appears to have a significant wound that drains quite a bit especially if he pushes at the 12 o'clock location he will have a significant amount of yellow drainage not really purulent noted. He is not have any significant pain at this time which is good news. He does state that he occasionally has gotten tissue as well as blood clots out of the wound. 05/05/2019; this is a patient who suffered a tree stump laceration injury on the left anterior mid tibia area. He was seen and admitted to clinic last week. Use silver alginate packing and a Kerlix Coban compression. I believe he took this off at some point and his wife put a gauze wrap on him. He complained about the wrap falling down. 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 His ABI in our clinic was noncompressible. He has 3 more days of Keflex which we apparently gave him last week. I do not see any cultures 6/24; traumatic wound on the left anterior mid tibia likely in the setting of chronic venous insufficiency with some degree of secondary lymphedema. He tolerated the 3 layer wrap I put him on him last week and the superior tunnel is gone from over 6 cm to 2.6 cm today. What I can see if the tissue actually looks quite good. We would using silver alginate as the primary dressing 7/1; traumatic wound on the left anterior mid tibia in the setting of chronic venous insufficiency mild degree of lymphedema. He is tolerating the 3 layer compression. He has a tunnel superiorly that continues to come in nicely. Otherwise the surface area of the wound looks about the same. Slight hyper granulation we have been using silver alginate 7/8; wound surface area is improving however the tunnel at 12:00 still is at 1.5 cm which is about the same as last week. This had previously come down nicely. We have been using silver alginate changed him to silver collagen Electronic Signature(s) Signed: 05/26/2019 5:45:20 PM By: Linton Ham MD Entered By: Linton Ham on 05/26/2019 08:43:22 Benjamin [name], [name] ([id number]) -------------------------------------------------------------------------------- CHEM CAUT GRANULATION TISS Details Patient Name: [name], [name] Date of Service: 05/26/2019 8:15 AM Medical Record Number: [id number] Patient Account Number: [id number] Date of Birth/Sex: [birth year] (68 y.o. M) Treating RN: Cornell Barman Primary Care Provider: Salome Holmes Other Clinician: Referring  Provider: Salome Holmes Treating Provider/Extender: Tito Dine in Treatment: 4 Procedure Performed for: Wound #1 Left,Anterior Lower Leg Performed By: Physician Ricard Dillon, MD Post Procedure Diagnosis Same as  Pre-procedure Notes 2 silver nitrate sticks used Electronic Signature(s) Signed: 05/26/2019 5:45:20 PM By: Linton Ham MD Entered By: Linton Ham on 05/26/2019 08:39:56 Benjamin [name], Benjamin [name] ([id number]) -------------------------------------------------------------------------------- Physical Exam Details Patient Name: [name], [name] Date of Service: 05/26/2019 8:15 AM Medical Record Number: [id number] Patient Account Number: [id number] Date of Birth/Sex: Oct 09, 1951 (68 y.o. M) Treating RN: Cornell Barman Primary Care Provider: Salome Holmes Other Clinician: Referring Provider: Salome Holmes Treating Provider/Extender: Tito Dine in Treatment: 4 Notes Wound exam; clean but hyper granulated wound bed. This is come in nicely in terms of surface area. The tunnel is 1.5 cm as measured by myself today. This is the same as last week but this really came down nicely from when he was first here. Hyper granulated surface I continue to use silver nitrate on this I also use this in the tunneled area. 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 Date of Service: 05/26/2019 8:15 AM Medical Record Number: [id number] Patient Account Number: [id number] Date of Birth/Sex: 1951/02/23 (68 y.o. M) Treating RN: Cornell Barman Primary Care Provider: Salome Holmes Other Clinician: Referring Provider: Salome Holmes Treating Provider/Extender: Tito Dine in Treatment: 4 Verbal / Phone Orders: No Diagnosis Coding Wound Cleansing Wound #1 Left,Anterior Lower Leg o Clean wound with Normal  Saline. Anesthetic (add to Medication List) Wound #1 Left,Anterior Lower Leg o Topical Lidocaine 4% cream applied to wound bed prior to debridement (In Clinic Only). Skin Barriers/Peri-Wound Care Wound #1 Left,Anterior Lower Leg o Moisturizing lotion Primary Wound Dressing Wound #1 Left,Anterior Lower Leg o Silver Collagen - Packed lightly into tunnel at 12:00. Secondary Dressing Wound #1 Left,Anterior Lower Leg o XtraSorb Dressing Change Frequency Wound #1 Left,Anterior Lower Leg o Change dressing every week Follow-up Appointments Wound #1 Left,Anterior Lower Leg o Return Appointment in 1 week. o Nurse Visit as needed Edema Control Wound #1 Left,Anterior Lower Leg o 3 Layer Compression System - Left Lower Extremity Electronic Signature(s) Signed: 05/26/2019 5:45:20 PM By: Linton Ham MD Signed: 05/26/2019 5:54:22 PM By: Gretta Cool, BSN, RN, CWS, Kim RN, BSN Entered By: Gretta Cool, BSN, RN, CWS, Kim on 05/26/2019 08:36:02 Benjamin [name], Benjamin [name] ([id number]) -------------------------------------------------------------------------------- Problem List Details Patient Name: ADACHI, Monique S. Date of Service: 05/26/2019 8:15 AM Medical Record Number: [id number] Patient Account Number: [id number] Date of Birth/Sex: 12-31-1950 (68 y.o. M) Treating RN: Cornell Barman Primary Care Provider: Salome Holmes Other Clinician: Referring Provider: Salome Holmes Treating Provider/Extender: Tito Dine in Treatment: 4 Active Problems ICD-10 Evaluated Encounter Code Description Active Date Today Diagnosis I87.2 Venous insufficiency (chronic) (peripheral) 04/26/2019 No Yes L97.822 Non-pressure chronic ulcer of other part of left lower leg with 04/26/2019 No Yes fat layer exposed I89.0 Lymphedema, not elsewhere classified 05/05/2019 No Yes Inactive Problems Resolved Problems Electronic Signature(s) Signed: 05/26/2019 5:45:20 PM By: Linton Ham MD Entered By: Linton Ham  on 05/26/2019 08:39:24 Benjamin [name], Benjamin [name] ([id number]) -------------------------------------------------------------------------------- Progress Note Details Patient Name: Benjamin [name], [name] Date of Service: 05/26/2019 8:15 AM Medical Record Number: [id number] Patient Account Number: [id number] Date of Birth/Sex: 1951-11-07 (68 y.o. M)  Treating RN: Cornell Barman Primary Care Provider: Salome Holmes Other Clinician: Referring Provider: Salome Holmes Treating Provider/Extender: Tito Dine in Treatment: 4 Subjective History of Present Illness (HPI) 04/26/19 on evaluation today patient presents for initial evaluation our clinic concerning an issue that he has been having with the wound on the left anterior lower extremity which occurred in April 2020 as a result of a traumatic injury when he was working on a tree stump in his yard He states that the tree stump flew back and hit him in the shin causing a significant amount of bruising and abrasion even through his jeans. He did end up in the hospital for time where he was given antibiotics fortunately he's done with those at this point although he still appears to have a significant wound that drains quite a bit especially if he pushes at the 12 o'clock location he will have a significant amount of yellow drainage not really purulent noted. He is not have any significant pain at this time which is good news. He does state that he occasionally has gotten tissue as well as blood clots out of the wound. 05/05/2019; this is a patient who suffered a tree stump laceration injury on the left anterior mid tibia area. He was seen and admitted to clinic last week. Use silver alginate packing and a Kerlix Coban compression. I believe he took this off at some point and his wife put a gauze wrap on him. He complained about the wrap falling down. 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 His ABI in our clinic was noncompressible. He has 3 more days of Keflex which we apparently gave him last week. I do not see any cultures 6/24; traumatic wound on the left anterior mid tibia likely in the setting of chronic venous insufficiency with some degree of secondary lymphedema. He tolerated the 3 layer wrap I put him on him last week and the superior tunnel is gone from over 6 cm to 2.6 cm today. What I can see if the tissue actually looks quite good. We would using silver alginate as the primary dressing 7/1; traumatic wound on the left anterior mid tibia in the setting of chronic venous insufficiency mild degree of lymphedema. He is tolerating the 3 layer compression. He has a tunnel superiorly that continues to come in nicely. Otherwise the surface area of the wound looks about the same. Slight hyper granulation we have been using silver alginate 7/8; wound surface area is improving however the tunnel at 12:00 still is at 1.5 cm which is about the same as last week. This had previously come down nicely. We have been using silver alginate changed him to silver collagen Objective Constitutional Vitals Time Taken: 8:20 AM, Height: 68 in, Weight: 275 lbs, BMI: 41.8, Temperature: 97.8 F, Pulse: 71 bpm, Respiratory Rate: 18 breaths/min, Blood Pressure: 136/55 mmHg. Integumentary (Hair, Skin) Wound #1 status is Open. Original cause of wound was Trauma. The wound is located on the Left,Anterior Lower Leg. The wound measures 2cm length x 0.9cm width x 0.6cm depth; 1.414cm^2 area and 0.848cm^3 volume. There is Fat Layer (Subcutaneous Tissue) Exposed exposed. There is no undermining noted, however, there is tunneling at 12:00 with a Liskey, [name] ([id number]) maximum distance of 1.5cm. There is a large  amount of serous drainage noted. The wound margin is flat and  intact. There is large (67-100%) red granulation within the wound bed. There is a small (1-33%) amount of necrotic tissue within the wound bed including Adherent Slough. Assessment Active Problems ICD-10 Venous insufficiency (chronic) (peripheral) Non-pressure chronic ulcer of other part of left lower leg with fat layer exposed Lymphedema, not elsewhere classified Procedures Wound #1 Pre-procedure diagnosis of Wound #1 is a Trauma, Other located on the Left,Anterior Lower Leg . An CHEM CAUT GRANULATION TISS procedure was performed by Ricard Dillon, MD. Post procedure Diagnosis Wound #1: Same as Pre-Procedure Notes: 2 silver nitrate sticks used Plan Wound Cleansing: Wound #1 Left,Anterior Lower Leg: Clean wound with Normal Saline. Anesthetic (add to Medication List): Wound #1 Left,Anterior Lower Leg: Topical Lidocaine 4% cream applied to wound bed prior to debridement (In Clinic Only). Skin Barriers/Peri-Wound Care: Wound #1 Left,Anterior Lower Leg: Moisturizing lotion Primary Wound Dressing: Wound #1 Left,Anterior Lower Leg: Silver Collagen - Packed lightly into tunnel at 12:00. Secondary Dressing: Wound #1 Left,Anterior Lower Leg: XtraSorb Dressing Change Frequency: Wound #1 Left,Anterior Lower Leg: Change dressing every week Follow-up Appointments: Wound #1 Left,Anterior Lower Leg: Return Appointment in 1 week. Nurse Visit as needed Edema Control: JANSON, Benjamin [name]. ([id number]) Wound #1 Left,Anterior Lower Leg: 3 Layer Compression System - Left Lower Extremity 1. Change the primary dressing to silver collagen moistened with hydrogel 2. Still the same compression which we are changing weekly he has some degree of chronic venous insufficiency with secondary lymphedema 3. No evidence of infection peripheral pulses are palpable. Electronic Signature(s) Signed: 05/26/2019 5:45:20 PM By: Linton Ham MD Entered By: Linton Ham on 05/26/2019 08:45:59 Benjamin [name],  Benjamin [name] ([id number]) -------------------------------------------------------------------------------- [place] Details Patient Name: Benjamin [name], Benjamin [name] S. Date of Service: 05/26/2019 Medical Record Number: [id number] Patient Account Number: [id number] Date of Birth/Sex: [birth year] (68 y.o. M) Treating RN: Cornell Barman Primary Care Provider: Salome Holmes Other Clinician: Referring Provider: Salome Holmes Treating Provider/Extender: Tito Dine in Treatment: 4 Diagnosis Coding ICD-10 Codes Code Description I87.2 Venous insufficiency (chronic) (peripheral) L97.822 Non-pressure chronic ulcer of other part of left lower leg with fat layer exposed I89.0 Lymphedema, not elsewhere classified Facility Procedures CPT4 Code Description: [id number] - CHEM CAUT GRANULATION TISS ICD-10 Diagnosis Description L97.822 Non-pressure chronic ulcer of other part of left lower leg with Modifier: fat layer expos Quantity: 1 ed Physician Procedures CPT4 Code Description: [id number] - WC PHYS CHEM CAUT GRAN TISSUE ICD-10 Diagnosis Description L97.822 Non-pressure chronic ulcer of other part of left lower leg with Modifier: fat layer expos Quantity: 1 ed Electronic Signature(s) Signed: 05/26/2019 5:45:20 PM By: Linton Ham MD Entered By: Linton Ham on 05/26/2019 08:46:25

## 2019-06-02 ENCOUNTER — Other Ambulatory Visit: Payer: Self-pay

## 2019-06-02 ENCOUNTER — Encounter: Payer: Medicare Other | Admitting: Internal Medicine

## 2019-06-02 DIAGNOSIS — L97822 Non-pressure chronic ulcer of other part of left lower leg with fat layer exposed: Secondary | ICD-10-CM | POA: Diagnosis not present

## 2019-06-03 NOTE — Progress Notes (Signed)
BURNELL, HURTA ([id number]) Visit Report for 06/02/2019 Arrival Information Details Patient Name: Benjamin [name], Benjamin [name]. Date of Service: 06/02/2019 8:15 AM Medical Record Number: [id number] Patient Account Number: [id number] Date of Birth/Sex: 12/22/1950 (68 y.o. M) Treating RN: Harold Barban Primary Care Makaiah Terwilliger: Salome Holmes Other Clinician: Referring Chastidy Ranker: Salome Holmes Treating Tyesha Joffe/Extender: Tito Dine in Treatment: 5 Visit Information History Since Last Visit Added or deleted any medications: No Patient Arrived: Ambulatory Any new allergies or adverse reactions: No Arrival Time: 08:24 Had a fall or experienced change in No Accompanied By: self activities of daily living that may affect Transfer Assistance: None risk of falls: Patient Identification Verified: Yes Signs or symptoms of abuse/neglect since last visito No Secondary Verification Process Completed: Yes Hospitalized since last visit: No Has Dressing in Place as Prescribed: Yes Has Compression in Place as Prescribed: Yes Pain Present Now: Yes Electronic Signature(s) Signed: 06/02/2019 3:56:42 PM By: Harold Barban Entered By: Harold Barban on 06/02/2019 08:25:05 Stegner, [name] ([id number]) -------------------------------------------------------------------------------- Compression Therapy Details Patient Name: Benjamin [name], [name] Date of Service: 06/02/2019 8:15 AM Medical Record Number: [id number] Patient Account Number: [id number] Date of Birth/Sex: [birth year] (68 y.o. M) Treating RN: Cornell Barman Primary Care Lyann Hagstrom: Salome Holmes Other Clinician: Referring Markes Shatswell: Salome Holmes Treating Johnice Riebe/Extender: Tito Dine in Treatment: 5 Compression Therapy Performed for Wound Assessment: Wound #1 Left,Anterior Lower Leg Performed By: Clinician Cornell Barman, RN Compression Type: Three Layer Pre Treatment ABI: 1.5 Post Procedure Diagnosis Same as  Pre-procedure Electronic Signature(s) Signed: 06/02/2019 4:20:22 PM By: Gretta Cool, BSN, RN, CWS, Kim RN, BSN Entered By: Gretta Cool, BSN, RN, CWS, Kim on 06/02/2019 08:47:02 Deremer, Benjamin [name] ([id number]) -------------------------------------------------------------------------------- Encounter Discharge Information Details Patient Name: MAYALL, [name] Date of Service: 06/02/2019 8:15 AM Medical Record Number: [id number] Patient Account Number: [id number] Date of Birth/Sex: 1951/08/02 (68 y.o. M) Treating RN: Cornell Barman Primary Care Keonta Monceaux: Salome Holmes Other Clinician: Referring Taelyn Broecker: Salome Holmes Treating Tramya Schoenfelder/Extender: Tito Dine in Treatment: 5 Encounter Discharge Information Items Discharge Condition: Stable Ambulatory Status: Ambulatory Discharge Destination: Home Transportation: Private Auto Accompanied By: self Schedule Follow-up Appointment: Yes Clinical Summary of Care: Electronic Signature(s) Signed: 06/02/2019 4:20:22 PM By: Gretta Cool, BSN, RN, CWS, Kim RN, BSN Entered By: Gretta Cool, BSN, RN, CWS, Kim on 06/02/2019 08:49:33 Fieldhouse, Benjamin [name] ([id number]) -------------------------------------------------------------------------------- Lower Extremity Assessment Details Patient Name: Hincapie, Damyon S. Date of Service: 06/02/2019 8:15 AM Medical Record Number: [id number] Patient Account Number: [id number] Date of Birth/Sex: [birth year] (68 y.o. M) Treating RN: Harold Barban Primary Care Tylea Hise: Salome Holmes Other Clinician: Referring Paarth Cropper: Salome Holmes Treating Alima Naser/Extender: Tito Dine in Treatment: 5 Edema Assessment Assessed: [Left: No] [Right: No] [Left: Edema] [Right: :] Calf Left: Right: Point of Measurement: 33 cm From Medial Instep 43.7 cm cm Ankle Left: Right: Point of Measurement: 10 cm From Medial Instep 25.5 cm cm Vascular Assessment Pulses: Dorsalis Pedis Palpable: [Left:Yes] Posterior  Tibial Palpable: [Left:Yes] Electronic Signature(s) Signed: 06/02/2019 3:56:42 PM By: Harold Barban Entered By: Harold Barban on 06/02/2019 08:37:59 Heider, Maikel S. ([id number]) -------------------------------------------------------------------------------- Multi Wound Chart Details Patient Name: Benjamin [name], [name] Date of Service: 06/02/2019 8:15 AM Medical Record Number: [id number] Patient Account Number: [id number] Date of Birth/Sex: 1951/04/12 (68 y.o. M) Treating RN: Cornell Barman Primary Care Loraine Bhullar: Salome Holmes Other Clinician: Referring Wisdom Seybold: Salome Holmes Treating Zay Yeargan/Extender: Tito Dine in Treatment: 5 Vital Signs Height(in): 68 Pulse(bpm): 75 Weight(lbs): 275 Blood Pressure(mmHg): 152/70 Body Mass Index(BMI): 42 Temperature(F): 98.0 Respiratory Rate 18 (breaths/min): Photos: [N/A:N/A] Wound  Location: Left Lower Leg - Anterior N/A N/A Wounding Event: Trauma N/A N/A Primary Etiology: Trauma, Other N/A N/A Secondary Etiology: Venous Leg Ulcer N/A N/A Date Acquired: 03/11/2019 N/A N/A Weeks of Treatment: 5 N/A N/A Wound Status: Open N/A N/A Measurements L x W x D 1.8x0.8x0.4 N/A N/A (cm) Area (cm) : 1.131 N/A N/A Volume (cm) : 0.452 N/A N/A % Reduction in Area: 68.00% N/A N/A % Reduction in Volume: 36.10% N/A N/A Position 1 (o'clock): 12 Maximum Distance 1 (cm): 0.7 Tunneling: Yes N/A N/A Classification: Partial Thickness N/A N/A Exudate Amount: Large N/A N/A Exudate Type: Serous N/A N/A Exudate Color: amber N/A N/A Wound Margin: Flat and Intact N/A N/A Granulation Amount: Large (67-100%) N/A N/A Granulation Quality: Red N/A N/A Necrotic Amount: Small (1-33%) N/A N/A Exposed Structures: Fat Layer (Subcutaneous N/A N/A Tissue) Exposed: Yes Fascia: No Tendon: No Muscle: No Sainsbury, Ryne S. ([id number]) Joint: No Bone: No Epithelialization: Small (1-33%) N/A N/A Procedures Performed: Compression Therapy N/A  N/A Treatment Notes Wound #1 (Left, Anterior Lower Leg) Notes prisma packed lightly into tunnel, abd, 3 layer wrap Electronic Signature(s) Signed: 06/02/2019 4:31:45 PM By: Linton Ham MD Entered By: Linton Ham on 06/02/2019 08:51:17 Bur, Tell Chauncey [name] ([id number]) -------------------------------------------------------------------------------- Multi-Disciplinary Care Plan Details Patient Name: POPLASKI, [name] Date of Service: 06/02/2019 8:15 AM Medical Record Number: [id number] Patient Account Number: [id number] Date of Birth/Sex: Apr 14, 1951 (68 y.o. M) Treating RN: Cornell Barman Primary Care Beauty Pless: Salome Holmes Other Clinician: Referring Contina Strain: Salome Holmes Treating Karlisa Gaubert/Extender: Tito Dine in Treatment: 5 Active Inactive Abuse / Safety / Falls / Self Care Management Nursing Diagnoses: Potential for falls Goals: Patient will remain injury free related to falls Date Initiated: 04/27/2019 Target Resolution Date: 07/24/2019 Goal Status: Active Interventions: Assess fall risk on admission and as needed Notes: Orientation to the Wound Care Program Nursing Diagnoses: Knowledge deficit related to the wound healing center program Goals: Patient/caregiver will verbalize understanding of the [facility] Date Initiated: 04/27/2019 Target Resolution Date: 07/24/2019 Goal Status: Active Interventions: Provide education on orientation to the wound center Notes: Pain, Acute or Chronic Nursing Diagnoses: Pain, acute or chronic: actual or potential Goals: Patient will verbalize adequate pain control and receive pain control interventions during procedures as needed Date Initiated: 04/26/2019 Target Resolution Date: 05/28/2019 Goal Status: Active Interventions: Assess comfort goal upon admission CINQUE, BEGLEY. ([id number]) Complete pain assessment as per visit requirements Notes: Wound/Skin Impairment Nursing Diagnoses: Impaired  tissue integrity Goals: Ulcer/skin breakdown will have a volume reduction of 30% by week 4 Date Initiated: 04/26/2019 Target Resolution Date: 05/24/2019 Goal Status: Active Ulcer/skin breakdown will have a volume reduction of 50% by week 8 Date Initiated: 04/26/2019 Target Resolution Date: 06/21/2019 Goal Status: Active Interventions: Assess patient/caregiver ability to obtain necessary supplies Assess ulceration(s) every visit Notes: Electronic Signature(s) Signed: 06/02/2019 4:20:22 PM By: Gretta Cool, BSN, RN, CWS, Kim RN, BSN Entered By: Gretta Cool, BSN, RN, CWS, Kim on 06/02/2019 08:46:14 Vandewater, Benjamin [name] ([id number]) -------------------------------------------------------------------------------- Pain Assessment Details Patient Name: QUANT, [name] Date of Service: 06/02/2019 8:15 AM Medical Record Number: [id number] Patient Account Number: [id number] Date of Birth/Sex: 03/14/1951 (68 y.o. M) Treating RN: Harold Barban Primary Care Ermon Sagan: Salome Holmes Other Clinician: Referring Eupha Lobb: Salome Holmes Treating Keandre Linden/Extender: Tito Dine in Treatment: 5 Active Problems Location of Pain Severity and Description of Pain Patient Has Paino Yes Site Locations Pain Location: Pain in Ulcers Rate the pain. Current Pain Level: 2 Pain Management and Medication Current Pain Management: Notes Stinging  pain Electronic Signature(s) Signed: 06/02/2019 3:56:42 PM By: Harold Barban Entered By: Harold Barban on 06/02/2019 08:25:34 Fehnel, Benjamin [name] ([id number]) -------------------------------------------------------------------------------- Patient/Caregiver Education Details Patient Name: Inda Castle. Date of Service: 06/02/2019 8:15 AM Medical Record Number: [id number] Patient Account Number: [id number] Date of Birth/Gender: Sep 20, 1951 (68 y.o. M) Treating RN: Cornell Barman Primary Care Physician: Salome Holmes Other Clinician: Referring Physician: Salome Holmes Treating Physician/Extender: Tito Dine in Treatment: 5 Education Assessment Education Provided To: Patient Education Topics Provided Wound/Skin Impairment: Handouts: Caring for Your Ulcer Methods: Demonstration, Explain/Verbal Responses: State content correctly Electronic Signature(s) Signed: 06/02/2019 4:20:22 PM By: Gretta Cool, BSN, RN, CWS, Kim RN, BSN Entered By: Gretta Cool, BSN, RN, CWS, Kim on 06/02/2019 08:48:38 Benjamin [name], Benjamin [name] ([id number]) -------------------------------------------------------------------------------- Wound Assessment Details Patient Name: Benjamin [name], [name] Date of Service: 06/02/2019 8:15 AM Medical Record Number: [id number] Patient Account Number: [id number] Date of Birth/Sex: [birth year] (68 y.o. M) Treating RN: Harold Barban Primary Care Jamari Moten: Salome Holmes Other Clinician: Referring Roch Quach: Salome Holmes Treating Micheala Morissette/Extender: Tito Dine in Treatment: 5 Wound Status Wound Number: 1 Primary Etiology: Trauma, Other Wound Location: Left Lower Leg - Anterior Secondary Etiology: Venous Leg Ulcer Wounding Event: Trauma Wound Status: Open Date Acquired: 03/11/2019 Weeks Of Treatment: 5 Clustered Wound: No Photos Wound Measurements Length: (cm) 1.8 % Reduction i Width: (cm) 0.8 % Reduction i Depth: (cm) 0.4 Epithelializa Area: (cm) 1.131 Tunneling: Volume: (cm) 0.452 Position Maximum Di n Area: 68% n Volume: 36.1% tion: Small (1-33%) Yes (o'clock): 12 stance: (cm) 0.7 Undermining: No Wound Description Classification: Partial Thickness Foul Odor Af Wound Margin: Flat and Intact Slough/Fibri Exudate Amount: Large Exudate Type: Serous Exudate Color: amber ter Cleansing: No no Yes Wound Bed Granulation Amount: Large (67-100%) Exposed Structure Granulation Quality: Red Fascia Exposed: No Necrotic Amount: Small (1-33%) Fat Layer (Subcutaneous Tissue) Exposed: Yes Necrotic Quality: Adherent  Slough Tendon Exposed: No Muscle Exposed: No Joint Exposed: No Bone Exposed: No [place]uan, [name] ([id number]) Treatment Notes Wound #1 (Left, Anterior Lower Leg) Notes prisma packed lightly into tunnel, abd, 3 layer wrap Electronic Signature(s) Signed: 06/02/2019 3:56:42 PM By: Harold Barban Signed: 06/02/2019 4:20:22 PM By: Gretta Cool, BSN, RN, CWS, Kim RN, BSN Entered By: Gretta Cool, BSN, RN, CWS, Kim on 06/02/2019 08:45:28 Brookens, Benjamin Chauncey [name] ([id number]) -------------------------------------------------------------------------------- Vitals Details Patient Name: SOUDER, Jamarea S. Date of Service: 06/02/2019 8:15 AM Medical Record Number: [id number] Patient Account Number: [id number] Date of Birth/Sex: Aug 27, 1951 (68 y.o. M) Treating RN: Harold Barban Primary Care Santi Troung: Salome Holmes Other Clinician: Referring Yumi Insalaco: Salome Holmes Treating Margot Oriordan/Extender: Tito Dine in Treatment: 5 Vital Signs Time Taken: 08:25 Temperature (F): 98.0 Height (in): 68 Pulse (bpm): 75 Weight (lbs): 275 Respiratory Rate (breaths/min): 18 Body Mass Index (BMI): 41.8 Blood Pressure (mmHg): 152/70 Reference Range: 80 - 120 mg / dl Electronic Signature(s) Signed: 06/02/2019 3:56:42 PM By: Harold Barban Entered By: Harold Barban on 06/02/2019 08:29:19

## 2019-06-03 NOTE — Progress Notes (Signed)
AMARIS, GARRETTE ([id number]) Visit Report for 06/02/2019 HPI Details Patient Name: Benjamin [name], Benjamin [name]. Date of Service: 06/02/2019 8:15 AM Medical Record Number: [id number] Patient Account Number: [id number] Date of Birth/Sex: 1951-10-06 (68 y.o. M) Treating RN: Cornell Barman Primary Care Provider: Salome Holmes Other Clinician: Referring Provider: Salome Holmes Treating Provider/Extender: Tito Dine in Treatment: 5 History of Present Illness HPI Description: 04/26/19 on evaluation today patient presents for initial evaluation our clinic concerning an issue that he has been having with the wound on the left anterior lower extremity which occurred in April 2020 as a result of a traumatic injury when he was working on a tree stump in his yard He states that the tree stump flew back and hit him in the shin causing a significant amount of bruising and abrasion even through his jeans. He did end up in the hospital for time where he was given antibiotics fortunately he's done with those at this point although he still appears to have a significant wound that drains quite a bit especially if he pushes at the 12 o'clock location he will have a significant amount of yellow drainage not really purulent noted. He is not have any significant pain at this time which is good news. He does state that he occasionally has gotten tissue as well as blood clots out of the wound. 05/05/2019; this is a patient who suffered a tree stump laceration injury on the left anterior mid tibia area. He was seen and admitted to clinic last week. Use silver alginate packing and a Kerlix Coban compression. I believe he took this off at some point and his wife put a gauze wrap on him. He complained about the wrap falling down. 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 His ABI in our clinic was noncompressible. He has 3 more days of Keflex which we apparently gave him last week. I do not see any cultures 6/24; traumatic wound on the left anterior mid tibia likely in the setting of chronic venous insufficiency with some degree of secondary lymphedema. He tolerated the 3 layer wrap I put him on him last week and the superior tunnel is gone from over 6 cm to 2.6 cm today. What I can see if the tissue actually looks quite good. We would using silver alginate as the primary dressing 7/1; traumatic wound on the left anterior mid tibia in the setting of chronic venous insufficiency mild degree of lymphedema. He is tolerating the 3 layer compression. He has a tunnel superiorly that continues to come in nicely. Otherwise the surface area of the wound looks about the same. Slight hyper granulation we have been using silver alginate 7/8; wound surface area is improving however the tunnel at 12:00 still is at 1.5 cm which is about the same as last week. This had previously come down nicely. We have been using silver alginate changed him to silver collagen 7/15. The wound has contracted nicely. Superior tunnel is even come in with a maximum depth of 0.7 cm almost down 50% from last week. We have been using silver collagen under compression Electronic Signature(s) Signed: 06/02/2019 4:31:45 PM By: Linton Ham MD Entered By: Linton Ham on 06/02/2019 08:52:02 Jablonsky, [place]. ([id number]) -------------------------------------------------------------------------------- Physical Exam Details Patient Name: [name], [name] Date of  Service: 06/02/2019 8:15 AM Medical Record Number: [id number] Patient Account Number: [id number] Date of Birth/Sex: 05-Mar-1951 (68 y.o. M) Treating RN: Cornell Barman Primary Care Provider: Salome Holmes Other Clinician: Referring Provider: Salome Holmes Treating Provider/Extender: Tito Dine in Treatment: 5 Constitutional Patient is hypertensive.. Pulse regular and within target range for patient.Marland Kitchen Respirations regular, non-labored and within target range.. Temperature is normal and within the target range for the patient.Marland Kitchen appears in no distress. Respiratory Respiratory effort is easy and symmetric bilaterally. Rate is normal at rest and on room air.. Cardiovascular Pedal pulses palpable. Edema is under excellent control. Integumentary (Hair, Skin) No surrounding erythema seen. Psychiatric No evidence of depression, anxiety, or agitation. Calm, cooperative, and communicative. Appropriate interactions and affect.. Notes Wound exam; wound bed looks very healthy. Tunnel is come in.from 1.5 to 0.7 cm. Everything looks good here. No surrounding erythema no purulent drainage no tenderness. The patient states that he episodically will get short bursts of pain. This may reflect superficial nerve injury we discussed this Electronic Signature(s) Signed: 06/02/2019 4:31:45 PM By: Linton Ham MD Entered By: Linton Ham on 06/02/2019 08:56:08 Oettinger, [name] ([id number]) -------------------------------------------------------------------------------- Physician Orders Details Patient Name: [name], [name] Date of Service: 06/02/2019 8:15 AM Medical Record Number: [id number] Patient Account Number: [id number] Date of Birth/Sex: 12/21/1950 (68 y.o. M) Treating RN: Cornell Barman Primary Care Provider: Salome Holmes Other Clinician: Referring Provider: Salome Holmes Treating Provider/Extender: Tito Dine in Treatment: 5 Verbal / Phone Orders: No Diagnosis Coding Wound Cleansing Wound #1 Left,Anterior Lower Leg o Clean wound with Normal Saline. Anesthetic (add to Medication List) Wound #1 Left,Anterior Lower Leg o Topical Lidocaine 4% cream applied to wound bed prior to debridement (In Clinic Only). Skin Barriers/Peri-Wound Care Wound  #1 Left,Anterior Lower Leg o Moisturizing lotion Primary Wound Dressing Wound #1 Left,Anterior Lower Leg o Silver Collagen - Packed lightly into tunnel at 12:00. Secondary Dressing Wound #1 Left,Anterior Lower Leg o ABD pad Dressing Change Frequency Wound #1 Left,Anterior Lower Leg o Change dressing every week Follow-up Appointments Wound #1 Left,Anterior Lower Leg o Return Appointment in 1 week. o Nurse Visit as needed Edema Control Wound #1 Left,Anterior Lower Leg o 3 Layer Compression System - Left Lower Extremity Electronic Signature(s) Signed: 06/02/2019 4:20:22 PM By: Gretta Cool, BSN, RN, CWS, Kim RN, BSN Signed: 06/02/2019 4:31:45 PM By: Linton Ham MD Entered By: Gretta Cool, BSN, RN, CWS, Kim on 06/02/2019 08:47:44 Saathoff, Jamarcus Chauncey Cruel ([id number]) -------------------------------------------------------------------------------- Problem List Details Patient Name: GLUTH, Vineet S. Date of Service: 06/02/2019 8:15 AM Medical Record Number: [id number] Patient Account Number: [id number] Date of Birth/Sex: 1951/07/12 (68 y.o. M) Treating RN: Cornell Barman Primary Care Provider: Salome Holmes Other Clinician: Referring Provider: Salome Holmes Treating Provider/Extender: Tito Dine in Treatment: 5 Active Problems ICD-10 Evaluated Encounter Code Description Active Date Today Diagnosis I87.2 Venous insufficiency (chronic) (peripheral) 04/26/2019 No Yes L97.822 Non-pressure chronic ulcer of other part of left lower leg with 04/26/2019 No Yes fat layer exposed I89.0 Lymphedema, not elsewhere classified 05/05/2019 No Yes Inactive Problems Resolved Problems Electronic Signature(s) Signed: 06/02/2019 4:31:45 PM By: Linton Ham MD Entered By: Linton Ham on 06/02/2019 08:50:59 Simmonds, Karman S. ([id number]) -------------------------------------------------------------------------------- Progress Note Details Patient Name: Savidge, Dallen S. Date of  Service: 06/02/2019 8:15 AM Medical Record Number: [id number] Patient Account Number: [id number] Date of Birth/Sex: 31-Mar-1951 (68 y.o. M) Treating RN: Cornell Barman Primary Care Provider: Salome Holmes Other Clinician: Referring Provider: Salome Holmes Treating Provider/Extender: Tito Dine in Treatment: 5 Subjective History  of Present Illness (HPI) 04/26/19 on evaluation today patient presents for initial evaluation our clinic concerning an issue that he has been having with the wound on the left anterior lower extremity which occurred in April 2020 as a result of a traumatic injury when he was working on a tree stump in his yard He states that the tree stump flew back and hit him in the shin causing a significant amount of bruising and abrasion even through his jeans. He did end up in the hospital for time where he was given antibiotics fortunately he's done with those at this point although he still appears to have a significant wound that drains quite a bit especially if he pushes at the 12 o'clock location he will have a significant amount of yellow drainage not really purulent noted. He is not have any significant pain at this time which is good news. He does state that he occasionally has gotten tissue as well as blood clots out of the wound. 05/05/2019; this is a patient who suffered a tree stump laceration injury on the left anterior mid tibia area. He was seen and admitted to clinic last week. Use silver alginate packing and a Kerlix Coban compression. I believe he took this off at some point and his wife put a gauze wrap on him. He complained about the wrap falling down. 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 His ABI in our clinic was noncompressible. He has 3 more days of Keflex  which we apparently gave him last week. I do not see any cultures 6/24; traumatic wound on the left anterior mid tibia likely in the setting of chronic venous insufficiency with some degree of secondary lymphedema. He tolerated the 3 layer wrap I put him on him last week and the superior tunnel is gone from over 6 cm to 2.6 cm today. What I can see if the tissue actually looks quite good. We would using silver alginate as the primary dressing 7/1; traumatic wound on the left anterior mid tibia in the setting of chronic venous insufficiency mild degree of lymphedema. He is tolerating the 3 layer compression. He has a tunnel superiorly that continues to come in nicely. Otherwise the surface area of the wound looks about the same. Slight hyper granulation we have been using silver alginate 7/8; wound surface area is improving however the tunnel at 12:00 still is at 1.5 cm which is about the same as last week. This had previously come down nicely. We have been using silver alginate changed him to silver collagen 7/15. The wound has contracted nicely. Superior tunnel is even come in with a maximum depth of 0.7 cm almost down 50% from last week. We have been using silver collagen under compression Objective Constitutional Patient is hypertensive.. Pulse regular and within target range for patient.Marland Kitchen Respirations regular, non-labored and within target range.. Temperature is normal and within the target range for the patient.Marland Kitchen appears in no distress. Vitals Time Taken: 8:25 AM, Height: 68 in, Weight: 275 lbs, BMI: 41.8, Temperature: 98.0 F, Pulse: 75 bpm, Respiratory Rate: 18 breaths/min, Blood Pressure: 152/70 mmHg. Steuart, Devanta S. ([id number]) Respiratory Respiratory effort is easy and symmetric bilaterally. Rate is normal at rest and on room air.. Cardiovascular Pedal pulses palpable. Edema is under excellent control. Psychiatric No  evidence of depression, anxiety, or agitation. Calm,  cooperative, and communicative. Appropriate interactions and affect.. General Notes: Wound exam; wound bed looks very healthy. Tunnel is come in.from 1.5 to 0.7 cm. Everything looks good here. No surrounding erythema no purulent drainage no tenderness. The patient states that he episodically will get short bursts of pain. This may reflect superficial nerve injury we discussed this Integumentary (Hair, Skin) No surrounding erythema seen. Wound #1 status is Open. Original cause of wound was Trauma. The wound is located on the Left,Anterior Lower Leg. The wound measures 1.8cm length x 0.8cm width x 0.4cm depth; 1.131cm^2 area and 0.452cm^3 volume. There is Fat Layer (Subcutaneous Tissue) Exposed exposed. There is no undermining noted, however, there is tunneling at 12:00 with a maximum distance of 0.7cm. There is a large amount of serous drainage noted. The wound margin is flat and intact. There is large (67-100%) red granulation within the wound bed. There is a small (1-33%) amount of necrotic tissue within the wound bed including Adherent Slough. Assessment Active Problems ICD-10 Venous insufficiency (chronic) (peripheral) Non-pressure chronic ulcer of other part of left lower leg with fat layer exposed Lymphedema, not elsewhere classified Procedures Wound #1 Pre-procedure diagnosis of Wound #1 is a Trauma, Other located on the Left,Anterior Lower Leg . There was a Three Layer Compression Therapy Procedure with a pre-treatment ABI of 1.5 by Cornell Barman, RN. Post procedure Diagnosis Wound #1: Same as Pre-Procedure Plan Wound Cleansing: Wound #1 Left,Anterior Lower Leg: Clean wound with Normal Saline. CLEE, PANDIT ([id number]) Anesthetic (add to Medication List): Wound #1 Left,Anterior Lower Leg: Topical Lidocaine 4% cream applied to wound bed prior to debridement (In Clinic Only). Skin Barriers/Peri-Wound Care: Wound #1 Left,Anterior Lower Leg: Moisturizing lotion Primary Wound  Dressing: Wound #1 Left,Anterior Lower Leg: Silver Collagen - Packed lightly into tunnel at 12:00. Secondary Dressing: Wound #1 Left,Anterior Lower Leg: ABD pad Dressing Change Frequency: Wound #1 Left,Anterior Lower Leg: Change dressing every week Follow-up Appointments: Wound #1 Left,Anterior Lower Leg: Return Appointment in 1 week. Nurse Visit as needed Edema Control: Wound #1 Left,Anterior Lower Leg: 3 Layer Compression System - Left Lower Extremity 1. Left anterior lower leg. The wound continues to contract nicely. Tunnel was markedly improved. We are going to continue silver collagen under 3 layer compression. 2. This appears to be progressing towards closure. No evidence of infection or ischemia is seen today Electronic Signature(s) Signed: 06/02/2019 8:56:45 AM By: Linton Ham MD Entered By: Linton Ham on 06/02/2019 08:56:44 Sickinger, Bexley Chauncey Cruel ([id number]) -------------------------------------------------------------------------------- SuperBill Details Patient Name: [name], Dov S. Date of Service: 06/02/2019 Medical Record Number: [id number] Patient Account Number: [id number] Date of Birth/Sex: 05/08/1951 (68 y.o. M) Treating RN: Cornell Barman Primary Care Provider: Salome Holmes Other Clinician: Referring Provider: Salome Holmes Treating Provider/Extender: Tito Dine in Treatment: 5 Diagnosis Coding ICD-10 Codes Code Description I87.2 Venous insufficiency (chronic) (peripheral) L97.822 Non-pressure chronic ulcer of other part of left lower leg with fat layer exposed I89.0 Lymphedema, not elsewhere classified Facility Procedures CPT4 Code: [id number] Description: (Facility Use Only) [phone] - [facility] LWR LT LEG Modifier: Quantity: 1 Physician Procedures CPT4 Code Description: [id number] - WC PHYS LEVEL 3 - EST PT ICD-10 Diagnosis Description L97.822 Non-pressure chronic ulcer of other part of left lower leg wit I87.2 Venous  insufficiency (chronic) (peripheral) I89.0 Lymphedema, not elsewhere  classified Modifier: h fat layer expos Quantity: 1 ed Electronic Signature(s) Signed: 06/02/2019 4:31:45 PM By: Linton Ham MD Entered By: Linton Ham on 06/02/2019 08:54:48

## 2019-06-09 ENCOUNTER — Encounter: Payer: Medicare Other | Admitting: Internal Medicine

## 2019-06-09 ENCOUNTER — Other Ambulatory Visit: Payer: Self-pay

## 2019-06-09 DIAGNOSIS — L97822 Non-pressure chronic ulcer of other part of left lower leg with fat layer exposed: Secondary | ICD-10-CM | POA: Diagnosis not present

## 2019-06-09 NOTE — Progress Notes (Signed)
NEHEMIAH, MCFARREN ([id number]) Visit Report for 06/09/2019 HPI Details Patient Name: Benjamin [name], Benjamin [name]. Date of Service: 06/09/2019 8:15 AM Medical Record Number: [id number] Patient Account Number: [id number] Date of Birth/Sex: [birth year] (68 y.o. M) Treating RN: Cornell Barman Primary Care Provider: Salome Holmes Other Clinician: Referring Provider: Salome Holmes Treating Provider/Extender: Tito Dine in Treatment: 6 History of Present Illness HPI Description: 04/26/19 on evaluation today patient presents for initial evaluation our clinic concerning an issue that he has been having with the wound on the left anterior lower extremity which occurred in April 2020 as a result of a traumatic injury when he was working on a tree stump in his yard He states that the tree stump flew back and hit him in the shin causing a significant amount of bruising and abrasion even through his jeans. He did end up in the hospital for time where he was given antibiotics fortunately he's done with those at this point although he still appears to have a significant wound that drains quite a bit especially if he pushes at the 12 o'clock location he will have a significant amount of yellow drainage not really purulent noted. He is not have any significant pain at this time which is good news. He does state that he occasionally has gotten tissue as well as blood clots out of the wound. 05/05/2019; this is a patient who suffered a tree stump laceration injury on the left anterior mid tibia area. He was seen and admitted to clinic last week. Use silver alginate packing and a Kerlix Coban compression. I believe he took this off at some point and his wife put a gauze wrap on him. He complained about the wrap falling down. 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 His ABI in our clinic was noncompressible. He has 3 more days of Keflex which we apparently gave him last week. I do not see any cultures 6/24; traumatic wound on the left anterior mid tibia likely in the setting of chronic venous insufficiency with some degree of secondary lymphedema. He tolerated the 3 layer wrap I put him on him last week and the superior tunnel is gone from over 6 cm to 2.6 cm today. What I can see if the tissue actually looks quite good. We would using silver alginate as the primary dressing 7/1; traumatic wound on the left anterior mid tibia in the setting of chronic venous insufficiency mild degree of lymphedema. He is tolerating the 3 layer compression. He has a tunnel superiorly that continues to come in nicely. Otherwise the surface area of the wound looks about the same. Slight hyper granulation we have been using silver alginate 7/8; wound surface area is improving however the tunnel at 12:00 still is at 1.5 cm which is about the same as last week. This had previously come down nicely. We have been using silver alginate changed him to silver collagen 7/15. The wound has contracted nicely. Superior tunnel is even come in with a maximum depth of 0.7 cm almost down 50% from last week. We have been using silver collagen under compression 7/22; the wound is contracted nicely. The superior tunnel is now unmeasurable. We have been using silver collagen. No debridement is required Electronic Signature(s) Signed: 06/09/2019 4:35:36 PM By: Linton Ham MD  Entered By: Linton Ham on 06/09/2019 09:08:53 Hall, Darrow Chauncey Cruel ([id number]) -------------------------------------------------------------------------------- Physical Exam Details Patient Name: Benjamin [name], [name] Date of Service: 06/09/2019 8:15 AM Medical Record Number: [id number] Patient Account Number: [id number] Date of Birth/Sex: 03-18-1951 (68 y.o. M) Treating RN:  Cornell Barman Primary Care Provider: Salome Holmes Other Clinician: Referring Provider: Salome Holmes Treating Provider/Extender: Tito Dine in Treatment: 6 Constitutional Sitting or standing Blood Pressure is within target range for patient.. Pulse regular and within target range for patient.Marland Kitchen Respirations regular, non-labored and within target range.. Temperature is normal and within the target range for the patient.Marland Kitchen appears in no distress. Eyes Conjunctivae clear. No discharge. Respiratory Respiratory effort is easy and symmetric bilaterally. Rate is normal at rest and on room air.. Cardiovascular Pedal pulses are palpable. Edema control is excellent. Integumentary (Hair, Skin) There is no erythema around the wound. Psychiatric No evidence of depression, anxiety, or agitation. Calm, cooperative, and communicative. Appropriate interactions and affect.. Notes Wound exam; the wound bed continues to contract. The tunnel is now unmeasurable. Healthy looking granulation. No debridement is felt to be necessary unless there is stalls for 1 reason or another. May be closed in the next week or 2 Electronic Signature(s) Signed: 06/09/2019 4:35:36 PM By: Linton Ham MD Entered By: Linton Ham on 06/09/2019 09:16:24 Benjamin [name], Benjamin [name] ([id number]) -------------------------------------------------------------------------------- Physician Orders Details Patient Name: DESHLER, Jaymason S. Date of Service: 06/09/2019 8:15 AM Medical Record Number: [id number] Patient Account Number: [id number] Date of Birth/Sex: [birth year] (68 y.o. M) Treating RN: Cornell Barman Primary Care Provider: Salome Holmes Other Clinician: Referring Provider: Salome Holmes Treating Provider/Extender: Tito Dine in Treatment: 6 Verbal / Phone Orders: No Diagnosis Coding Wound Cleansing Wound #1 Left,Anterior Lower Leg o Clean wound with Normal Saline. Anesthetic (add to Medication  List) Wound #1 Left,Anterior Lower Leg o Topical Lidocaine 4% cream applied to wound bed prior to debridement (In Clinic Only). Skin Barriers/Peri-Wound Care Wound #1 Left,Anterior Lower Leg o Moisturizing lotion Primary Wound Dressing Wound #1 Left,Anterior Lower Leg o Silver Collagen - Packed lightly into tunnel at 12:00. Secondary Dressing Wound #1 Left,Anterior Lower Leg o ABD pad Dressing Change Frequency Wound #1 Left,Anterior Lower Leg o Change dressing every week Follow-up Appointments Wound #1 Left,Anterior Lower Leg o Return Appointment in 1 week. o Nurse Visit as needed Edema Control Wound #1 Left,Anterior Lower Leg o 3 Layer Compression System - Left Lower Extremity Electronic Signature(s) Signed: 06/09/2019 4:35:36 PM By: Linton Ham MD Signed: 06/09/2019 4:45:53 PM By: Gretta Cool, BSN, RN, CWS, Kim RN, BSN Entered By: Gretta Cool, BSN, RN, CWS, Kim on 06/09/2019 08:54:13 Bedrosian, [name] ([id number]) -------------------------------------------------------------------------------- Problem List Details Patient Name: Benjamin [name], [name] Date of Service: 06/09/2019 8:15 AM Medical Record Number: [id number] Patient Account Number: [id number] Date of Birth/Sex: Mar 03, 1951 (68 y.o. 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 ([id number]) -------------------------------------------------------------------------------- Progress Note Details Patient Name: Benjamin [name], [name] Date of Service: 06/09/2019 8:15 AM Medical Record Number: [id number] Patient Account Number: [id number] Date of  Birth/Sex: May 13, 1951 (68 y.o. M) Treating RN: Cornell Barman Primary Care Provider: Salome Holmes Other Clinician: Referring Provider: Salome Holmes Treating Provider/Extender: Tito Dine in Treatment: 6 Subjective History of Present Illness (HPI) 04/26/19 on evaluation today patient presents for initial evaluation our clinic concerning an issue that he has been having with the wound on the left anterior lower extremity which occurred in April 2020 as a result of a traumatic injury when he was working on a tree stump in his yard He states that the tree stump flew back and hit him in the shin causing a significant amount of bruising and abrasion even through his jeans. He did end up in the hospital for time where he was given antibiotics fortunately he's done with those at this point although he still appears to have a significant wound that drains quite a bit especially if he pushes at the 12 o'clock location he will have a significant amount of yellow drainage not really purulent noted. He is not have any significant pain at this time which is good news. He does state that he occasionally has gotten tissue as well as blood clots out of the wound. 05/05/2019; this is a patient who suffered a tree stump laceration injury on the left anterior mid tibia area. He was seen and admitted to clinic last week. Use silver alginate packing and a Kerlix Coban compression. I believe he took this off at some point and his wife put a gauze wrap on him. He complained about the wrap falling down. 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 His ABI in our clinic was noncompressible. He has 3 more days of Keflex which we apparently gave him last week. I do not see any cultures 6/24; traumatic wound on the left anterior mid tibia likely in the setting of chronic venous insufficiency with some degree of secondary lymphedema. He tolerated the 3 layer wrap I put him on him last week and the superior tunnel is gone from over 6 cm to 2.6 cm today. What I can see if the tissue actually looks quite good. We would using silver alginate as the primary dressing 7/1; traumatic wound on the left anterior mid tibia in the setting of chronic venous insufficiency mild degree of lymphedema. He is tolerating the 3 layer compression. He has a tunnel superiorly that continues to come in nicely. Otherwise the surface area of the wound looks about the same. Slight hyper granulation we have been using silver alginate 7/8; wound surface area is improving however the tunnel at 12:00 still is at 1.5 cm which is about the same as last week. This had previously come down nicely. We have been using silver alginate changed him to silver collagen 7/15. The wound has contracted nicely. Superior tunnel is even come in with a maximum depth of 0.7 cm almost down 50% from last week. We have been using silver collagen under compression 7/22; the wound is contracted nicely. The superior tunnel is now unmeasurable. We have been using silver collagen. No debridement is required Objective Constitutional Sitting or standing Blood Pressure is within target range for patient.. Pulse regular and within target range for patient.Marland Kitchen Respirations regular, non-labored and within target range.. Temperature is normal and within the target range for the patient.Marland Kitchen appears in  no distress. Benjamin [name], Benjamin [name] ([id number]) Vitals Time Taken: 8:28 AM, Height: 68 in, Weight: 275 lbs, BMI:  41.8, Temperature: 98.2 F, Pulse: 75 bpm, Respiratory Rate: 18 breaths/min, Blood Pressure: 143/70 mmHg. Eyes Conjunctivae clear. No discharge. Respiratory Respiratory effort is easy and symmetric bilaterally. Rate is normal at rest and on room air.. Cardiovascular Pedal pulses are palpable. Edema control is excellent. Psychiatric No evidence of depression, anxiety, or agitation. Calm, cooperative, and communicative. Appropriate interactions and affect.. General Notes: Wound exam; the wound bed continues to contract. The tunnel is now unmeasurable. Healthy looking granulation. No debridement is felt to be necessary unless there is stalls for 1 reason or another. May be closed in the next week or 2 Integumentary (Hair, Skin) There is no erythema around the wound. Wound #1 status is Open. Original cause of wound was Trauma. The wound is located on the Left,Anterior Lower Leg. The wound measures 1.8cm length x 0.6cm width x 0.3cm depth; 0.848cm^2 area and 0.254cm^3 volume. There is Fat Layer (Subcutaneous Tissue) Exposed exposed. There is no tunneling or undermining noted. There is a large amount of serous drainage noted. The wound margin is flat and intact. There is large (67-100%) red granulation within the wound bed. There is a small (1-33%) amount of necrotic tissue within the wound bed including Adherent Slough. Assessment Active Problems ICD-10 Venous insufficiency (chronic) (peripheral) Non-pressure chronic ulcer of other part of left lower leg with fat layer exposed Lymphedema, not elsewhere classified Procedures Wound #1 Pre-procedure diagnosis of Wound #1 is a Trauma, Other located on the Left,Anterior Lower Leg . There was a Three Layer Compression Therapy Procedure with a pre-treatment ABI of 1.5 by Cornell Barman, RN. Post procedure Diagnosis Wound #1: Same as Pre-Procedure Benjamin [name], Benjamin [name] ([id number]) Plan Wound Cleansing: Wound #1 Left,Anterior Lower Leg: Clean wound  with Normal Saline. Anesthetic (add to Medication List): Wound #1 Left,Anterior Lower Leg: Topical Lidocaine 4% cream applied to wound bed prior to debridement (In Clinic Only). Skin Barriers/Peri-Wound Care: Wound #1 Left,Anterior Lower Leg: Moisturizing lotion Primary Wound Dressing: Wound #1 Left,Anterior Lower Leg: Silver Collagen - Packed lightly into tunnel at 12:00. Secondary Dressing: Wound #1 Left,Anterior Lower Leg: ABD pad Dressing Change Frequency: Wound #1 Left,Anterior Lower Leg: Change dressing every week Follow-up Appointments: Wound #1 Left,Anterior Lower Leg: Return Appointment in 1 week. Nurse Visit as needed Edema Control: Wound #1 Left,Anterior Lower Leg: 3 Layer Compression System - Left Lower Extremity 1. Continuing with the silver collagen under 3 layer compression 2. The patient continues to do well 3. He is asking me about some injection he is supposed to get in his neck for what sounds like a nerve root issue question steroids. Apparently this is been on hold pending resolution of the wound. He states he needs another injection. I asked him to talk to me about this next week Electronic Signature(s) Signed: 06/09/2019 4:35:36 PM By: Linton Ham MD Entered By: Linton Ham on 06/09/2019 09:17:33 Benjamin [name], [name] ([id number]) -------------------------------------------------------------------------------- [place] Details Patient Name: Benjamin [name], [name] Date of Service: 06/09/2019 Medical Record Number: [id number] Patient Account Number: [id number] Date of Birth/Sex: November 08, 1951 (68 y.o. M) Treating RN: Cornell Barman Primary Care Provider: Salome Holmes Other Clinician: Referring Provider: Salome Holmes Treating Provider/Extender: Tito Dine in Treatment: 6 Diagnosis Coding ICD-10 Codes Code Description I87.2 Venous insufficiency (chronic) (peripheral) L97.822 Non-pressure chronic ulcer of other part of left lower leg with  fat layer exposed I89.0 Lymphedema, not elsewhere classified Facility Procedures CPT4 Code: [id number] Description: (  Facility Use Only) [id number] - [facility] LWR LT LEG Modifier: Quantity: 1 Physician Procedures CPT4 Code Description: [id number] - WC PHYS LEVEL 3 - EST PT ICD-10 Diagnosis Description L97.822 Non-pressure chronic ulcer of other part of left lower leg wit I89.0 Lymphedema, not elsewhere classified Modifier: h fat layer expos Quantity: 1 ed Electronic Signature(s) Signed: 06/09/2019 4:35:36 PM By: Linton Ham MD Entered By: Linton Ham on 06/09/2019 09:17:58

## 2019-06-10 NOTE — Progress Notes (Signed)
KORION, CUEVAS ([id number]) Visit Report for 06/09/2019 Arrival Information Details Patient Name: Benjamin [name], Benjamin [name]. Date of Service: 06/09/2019 8:15 AM Medical Record Number: [id number] Patient Account Number: [id number] Date of Birth/Sex: 1951-11-10 (68 y.o. M) Treating RN: Harold Barban Primary Care Ed Rayson: Salome Holmes Other Clinician: Referring Hadyn Blanck: Salome Holmes Treating [place] Nolton/Extender: Tito Dine in Treatment: 6 Visit Information History Since Last Visit Added or deleted any medications: No Patient Arrived: Ambulatory Any new allergies or adverse reactions: No Arrival Time: 08:27 Had a fall or experienced change in No Accompanied By: self activities of daily living that may affect Transfer Assistance: None risk of falls: Patient Identification Verified: Yes Signs or symptoms of abuse/neglect since last visito No Secondary Verification Process Completed: Yes Hospitalized since last visit: No Has Dressing in Place as Prescribed: Yes Has Compression in Place as Prescribed: Yes Pain Present Now: Yes Electronic Signature(s) Signed: 06/10/2019 3:44:22 PM By: Harold Barban Entered By: Harold Barban on 06/09/2019 08:27:34 Pavlov, Susana S. ([id number]) -------------------------------------------------------------------------------- Compression Therapy Details Patient Name: Benjamin [name], [name] Date of Service: 06/09/2019 8:15 AM Medical Record Number: [id number] Patient Account Number: [id number] Date of Birth/Sex: 1951/04/21 (68 y.o. M) Treating RN: Cornell Barman Primary Care Lovina Zuver: Salome Holmes Other Clinician: Referring Angelyna Henderson: Salome Holmes Treating Jameer Storie/Extender: Tito Dine in Treatment: 6 Compression Therapy Performed for Wound Assessment: Wound #1 Left,Anterior Lower Leg Performed By: Clinician Cornell Barman, RN Compression Type: Three Layer Pre Treatment ABI: 1.5 Post Procedure Diagnosis Same as  Pre-procedure Electronic Signature(s) Signed: 06/09/2019 4:45:53 PM By: Gretta Cool, BSN, RN, CWS, Kim RN, BSN Entered By: Gretta Cool, BSN, RN, CWS, Kim on 06/09/2019 08:55:30 Exantus, [name] ([id number]) -------------------------------------------------------------------------------- Encounter Discharge Information Details Patient Name: [name], [name] Date of Service: 06/09/2019 8:15 AM Medical Record Number: [id number] Patient Account Number: [id number] Date of Birth/Sex: [birth year] (68 y.o. M) Treating RN: Cornell Barman Primary Care Sylvia Helms: Salome Holmes Other Clinician: Referring Merelin Human: Salome Holmes Treating Winna Golla/Extender: Tito Dine in Treatment: 6 Encounter Discharge Information Items Discharge Condition: Stable Ambulatory Status: Ambulatory Discharge Destination: Home Transportation: Private Auto Accompanied By: self Schedule Follow-up Appointment: Yes Clinical Summary of Care: Electronic Signature(s) Signed: 06/09/2019 4:45:53 PM By: Gretta Cool, BSN, RN, CWS, Kim RN, BSN Entered By: Gretta Cool, BSN, RN, CWS, Kim on 06/09/2019 08:56:44 Leachman, [name] ([id number]) -------------------------------------------------------------------------------- Lower Extremity Assessment Details Patient Name: [name], [name] Date of Service: 06/09/2019 8:15 AM Medical Record Number: [id number] Patient Account Number: [id number] Date of Birth/Sex: [birth year] (68 y.o. M) Treating RN: Harold Barban Primary Care Jaeda Bruso: Salome Holmes Other Clinician: Referring Tarig Zimmers: Salome Holmes Treating Kimaya Whitlatch/Extender: Tito Dine in Treatment: 6 Edema Assessment Assessed: [Left: No] [Right: No] [Left: Edema] [Right: :] Calf Left: Right: Point of Measurement: 33 cm From Medial Instep 44.3 cm cm Ankle Left: Right: Point of Measurement: 10 cm From Medial Instep 26 cm cm Vascular Assessment Pulses: Dorsalis Pedis Palpable: [Left:Yes] Posterior Tibial Palpable:  [Left:Yes] Electronic Signature(s) Signed: 06/10/2019 3:44:22 PM By: Harold Barban Entered By: Harold Barban on 06/09/2019 08:39:37 Wynter, Aquan S. ([id number]) -------------------------------------------------------------------------------- Multi Wound Chart Details Patient Name: Benjamin [name], [name] Date of Service: 06/09/2019 8:15 AM Medical Record Number: [id number] Patient Account Number: [id number] Date of Birth/Sex: June 23, 1951 (68 y.o. M) Treating RN: Cornell Barman Primary Care Shaneta Cervenka: Salome Holmes Other Clinician: Referring Durenda Pechacek: Salome Holmes Treating Shelley Cocke/Extender: Tito Dine in Treatment: 6 Vital Signs Height(in): 68 Pulse(bpm): 75 Weight(lbs): 275 Blood Pressure(mmHg): 143/70 Body Mass Index(BMI): 42 Temperature(F): 98.2 Respiratory Rate 18 (breaths/min): Photos: [N/A:N/A] Wound  Location: Left Lower Leg - Anterior N/A N/A Wounding Event: Trauma N/A N/A Primary Etiology: Trauma, Other N/A N/A Secondary Etiology: Venous Leg Ulcer N/A N/A Date Acquired: 03/11/2019 N/A N/A Weeks of Treatment: 6 N/A N/A Wound Status: Open N/A N/A Measurements L x W x D 1.8x0.6x0.3 N/A N/A (cm) Area (cm) : 0.848 N/A N/A Volume (cm) : 0.254 N/A N/A % Reduction in Area: 76.00% N/A N/A % Reduction in Volume: 64.10% N/A N/A Classification: Partial Thickness N/A N/A Exudate Amount: Large N/A N/A Exudate Type: Serous N/A N/A Exudate Color: amber N/A N/A Wound Margin: Flat and Intact N/A N/A Granulation Amount: Large (67-100%) N/A N/A Granulation Quality: Red N/A N/A Necrotic Amount: Small (1-33%) N/A N/A Exposed Structures: Fat Layer (Subcutaneous N/A N/A Tissue) Exposed: Yes Fascia: No Tendon: No Muscle: No Joint: No Bone: No Epithelialization: Small (1-33%) N/A N/A Helbling, Yuriel S. ([id number]) Procedures Performed: Compression Therapy N/A N/A Treatment Notes Wound #1 (Left, Anterior Lower Leg) Notes prisma, abd, 3 layer  wrap Electronic Signature(s) Signed: 06/09/2019 4:35:36 PM By: Linton Ham MD Entered By: Linton Ham on 06/09/2019 09:07:53 Bealer, Korver Chauncey Cruel ([id number]) -------------------------------------------------------------------------------- Multi-Disciplinary Care Plan Details Patient Name: ISHAM, Aulton S. Date of Service: 06/09/2019 8:15 AM Medical Record Number: [id number] Patient Account Number: [id number] Date of Birth/Sex: 31-Dec-1950 (68 y.o. M) Treating RN: Cornell Barman Primary Care Jamie Hafford: Salome Holmes Other Clinician: Referring Rosalio Catterton: Salome Holmes Treating Vernal Rutan/Extender: Tito Dine in Treatment: 6 Active Inactive Abuse / Safety / Falls / Self Care Management Nursing Diagnoses: Potential for falls Goals: Patient will remain injury free related to falls Date Initiated: 04/27/2019 Target Resolution Date: 07/24/2019 Goal Status: Active Interventions: Assess fall risk on admission and as needed Notes: Orientation to the Wound Care Program Nursing Diagnoses: Knowledge deficit related to the wound healing center program Goals: Patient/caregiver will verbalize understanding of the [facility] Date Initiated: 04/27/2019 Target Resolution Date: 07/24/2019 Goal Status: Active Interventions: Provide education on orientation to the wound center Notes: Pain, Acute or Chronic Nursing Diagnoses: Pain, acute or chronic: actual or potential Goals: Patient will verbalize adequate pain control and receive pain control interventions during procedures as needed Date Initiated: 04/26/2019 Target Resolution Date: 05/28/2019 Goal Status: Active Interventions: Assess comfort goal upon admission JAUAN, WOHL. ([id number]) Complete pain assessment as per visit requirements Notes: Wound/Skin Impairment Nursing Diagnoses: Impaired tissue integrity Goals: Ulcer/skin breakdown will have a volume reduction of 30% by week 4 Date Initiated:  04/26/2019 Target Resolution Date: 05/24/2019 Goal Status: Active Ulcer/skin breakdown will have a volume reduction of 50% by week 8 Date Initiated: 04/26/2019 Target Resolution Date: 06/21/2019 Goal Status: Active Interventions: Assess patient/caregiver ability to obtain necessary supplies Assess ulceration(s) every visit Notes: Electronic Signature(s) Signed: 06/09/2019 4:45:53 PM By: Gretta Cool, BSN, RN, CWS, Kim RN, BSN Entered By: Gretta Cool, BSN, RN, CWS, Kim on 06/09/2019 08:53:39 Bearden, Dorance S. ([id number]) -------------------------------------------------------------------------------- Pain Assessment Details Patient Name: TREANOR, Rosalie S. Date of Service: 06/09/2019 8:15 AM Medical Record Number: [id number] Patient Account Number: [id number] Date of Birth/Sex: 07-05-1951 (68 y.o. M) Treating RN: Harold Barban Primary Care Tineshia Becraft: Salome Holmes Other Clinician: Referring Tempie Gibeault: Salome Holmes Treating Kazimir Hartnett/Extender: Tito Dine in Treatment: 6 Active Problems Location of Pain Severity and Description of Pain Patient Has Paino Yes Site Locations Rate the pain. Current Pain Level: 3 Pain Management and Medication Current Pain Management: Electronic Signature(s) Signed: 06/10/2019 3:44:22 PM By: Harold Barban Entered By: Harold Barban on 06/09/2019 08:35:01 Leachman, Kunaal S. ([id number]) -------------------------------------------------------------------------------- Patient/Caregiver Education Details  Patient Name: TAJ, NEVINS. Date of Service: 06/09/2019 8:15 AM Medical Record Number: [id number] Patient Account Number: [id number] Date of Birth/Gender: [birth year] (68 y.o. M) Treating RN: Cornell Barman Primary Care Physician: Salome Holmes Other Clinician: Referring Physician: Salome Holmes Treating Physician/Extender: Tito Dine in Treatment: 6 Education Assessment Education Provided To: Patient Education Topics  Provided Wound Debridement: Wound/Skin Impairment: Handouts: Caring for Your Ulcer Methods: Demonstration, Explain/Verbal Responses: State content correctly Electronic Signature(s) Signed: 06/09/2019 4:45:53 PM By: Gretta Cool, BSN, RN, CWS, Kim RN, BSN Entered By: Gretta Cool, BSN, RN, CWS, Kim on 06/09/2019 08:56:04 Biddinger, [name] ([id number]) -------------------------------------------------------------------------------- Wound Assessment Details Patient Name: Heesch, Mariana S. Date of Service: 06/09/2019 8:15 AM Medical Record Number: [id number] Patient Account Number: [id number] Date of Birth/Sex: 1951-06-25 (68 y.o. M) Treating RN: Harold Barban Primary Care Tully Mcinturff: Salome Holmes Other Clinician: Referring Falisha Osment: Salome Holmes Treating Francena Zender/Extender: Tito Dine in Treatment: 6 Wound Status Wound Number: 1 Primary Etiology: Trauma, Other Wound Location: Left Lower Leg - Anterior Secondary Etiology: Venous Leg Ulcer Wounding Event: Trauma Wound Status: Open Date Acquired: 03/11/2019 Weeks Of Treatment: 6 Clustered Wound: No Photos Wound Measurements Length: (cm) 1.8 % Reduction i Width: (cm) 0.6 % Reduction i Depth: (cm) 0.3 Epithelializa Area: (cm) 0.848 Tunneling: Volume: (cm) 0.254 Undermining: n Area: 76% n Volume: 64.1% tion: Small (1-33%) No No Wound Description Classification: Partial Thickness Foul Odor Af Wound Margin: Flat and Intact Slough/Fibri Exudate Amount: Large Exudate Type: Serous Exudate Color: amber ter Cleansing: No no Yes Wound Bed Granulation Amount: Large (67-100%) Exposed Structure Granulation Quality: Red Fascia Exposed: No Necrotic Amount: Small (1-33%) Fat Layer (Subcutaneous Tissue) Exposed: Yes Necrotic Quality: Adherent Slough Tendon Exposed: No Muscle Exposed: No Joint Exposed: No Bone Exposed: No Treatment Notes Bellemare, Terrius S. ([id number]) Wound #1 (Left, Anterior Lower Leg) Notes prisma,  abd, 3 layer wrap Electronic Signature(s) Signed: 06/10/2019 3:44:22 PM By: Harold Barban Entered By: Harold Barban on 06/09/2019 08:38:00 Nieman, Katherine S. ([id number]) -------------------------------------------------------------------------------- Vitals Details Patient Name: Benjamin [name], Caidence S. Date of Service: 06/09/2019 8:15 AM Medical Record Number: [id number] Patient Account Number: [id number] Date of Birth/Sex: September 28, 1951 (68 y.o. M) Treating RN: Harold Barban Primary Care Tyshia Fenter: Salome Holmes Other Clinician: Referring Azarian Starace: Salome Holmes Treating Beza Steppe/Extender: Tito Dine in Treatment: 6 Vital Signs Time Taken: 08:28 Temperature (F): 98.2 Height (in): 68 Pulse (bpm): 75 Weight (lbs): 275 Respiratory Rate (breaths/min): 18 Body Mass Index (BMI): 41.8 Blood Pressure (mmHg): 143/70 Reference Range: 80 - 120 mg / dl Electronic Signature(s) Signed: 06/10/2019 3:44:22 PM By: Harold Barban Entered By: Harold Barban on 06/09/2019 08:35:17

## 2019-06-16 ENCOUNTER — Encounter: Payer: Medicare Other | Admitting: Internal Medicine

## 2019-06-16 ENCOUNTER — Other Ambulatory Visit: Payer: Self-pay

## 2019-06-16 DIAGNOSIS — L97822 Non-pressure chronic ulcer of other part of left lower leg with fat layer exposed: Secondary | ICD-10-CM | POA: Diagnosis not present

## 2019-06-17 NOTE — Progress Notes (Signed)
ANGELINA, NEECE ([id number]) Visit Report for 06/16/2019 HPI Details Patient Name: Benjamin [name], Benjamin [name]. Date of Service: 06/16/2019 8:15 AM Medical Record Number: [id number] Patient Account Number: [id number] Date of Birth/Sex: 27-Dec-1950 (68 y.o. M) Treating RN: Benjamin [name] Primary Care Provider: Salome [name] Other Clinician: Referring Provider: Salome [name] Treating Provider/Extender: Benjamin [name] in Treatment: 7 History of Present Illness HPI Description: 04/26/19 on evaluation today patient presents for initial evaluation our clinic concerning an issue that he has been having with the wound on the left anterior lower extremity which occurred in April 2020 as a result of a traumatic injury when he was working on a tree stump in his yard He states that the tree stump flew back and hit him in the shin causing a significant amount of bruising and abrasion even through his jeans. He did end up in the hospital for time where he was given antibiotics fortunately he's done with those at this point although he still appears to have a significant wound that drains quite a bit especially if he pushes at the 12 o'clock location he will have a significant amount of yellow drainage not really purulent noted. He is not have any significant pain at this time which is good news. He does state that he occasionally has gotten tissue as well as blood clots out of the wound. 05/05/2019; this is a patient who suffered a tree stump laceration injury on the left anterior mid tibia area. He was seen and admitted to clinic last week. Use silver alginate packing and a Kerlix Coban compression. I believe he took this off at some point and his wife put a gauze wrap on him. He complained about the wrap falling down. 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 His ABI in our clinic was noncompressible. He has 3 more days of Keflex which we apparently gave him last week. I do not see any cultures 6/24; traumatic wound on the left anterior mid tibia likely in the setting of chronic venous insufficiency with some degree of secondary lymphedema. He tolerated the 3 layer wrap I put him on him last week and the superior tunnel is gone from over 6 cm to 2.6 cm today. What I can see if the tissue actually looks quite good. We would using silver alginate as the primary dressing 7/1; traumatic wound on the left anterior mid tibia in the setting of chronic venous insufficiency mild degree of lymphedema. He is tolerating the 3 layer compression. He has a tunnel superiorly that continues to come in nicely. Otherwise the surface area of the wound looks about the same. Slight hyper granulation we have been using silver alginate 7/8; wound surface area is improving however the tunnel at 12:00 still is at 1.5 cm which is about the same as last week. This had previously come down nicely. We have been using silver alginate changed him to silver collagen 7/15. The wound has contracted nicely. Superior tunnel is even come in with a maximum depth of 0.7 cm almost down 50% from last week. We have been using silver collagen under compression 7/22; the wound is contracted nicely. The superior tunnel is now unmeasurable. We have been using silver collagen. No debridement is required 7/29; wound continues to contract. There was some eschar over  the wound moving this still reveals some opening here somewhat wet. Silver collagen was stuck to the wound Electronic Signature(s) Signed: 06/16/2019 5:12:30 PM By: Benjamin Ham MD Entered By: Benjamin [name] on 06/16/2019 08:46:41 Magri, Benjamin [name] ([id number]) -------------------------------------------------------------------------------- Physical Exam Details Patient Name:  [name], [name] Date of Service: 06/16/2019 8:15 AM Medical Record Number: [id number] Patient Account Number: [id number] Date of Birth/Sex: 05-23-1951 (68 y.o. M) Treating RN: Benjamin [name] Primary Care Provider: Salome [name] Other Clinician: Referring Provider: Salome [name] Treating Provider/Extender: Benjamin [name] in Treatment: 7 Constitutional Sitting or standing Blood Pressure is within target range for patient.. Pulse regular and within target range for patient.. Temperature is normal and within the target range for the patient.Marland Kitchen appears in no distress. Eyes Conjunctivae clear. No discharge. Respiratory Respiratory effort is easy and symmetric bilaterally. Rate is normal at rest and on room air.. Cardiovascular Pedal pulses palpable and strong bilaterally.. Integumentary (Hair, Skin) No surrounding erythema. The tunnel superiorly is unmeasurable. Psychiatric No evidence of depression, anxiety, or agitation. Calm, cooperative, and communicative. Appropriate interactions and affect.. Notes Wound exam; wound bed covered with dry skin and eschar. After this was removed there is still some areas that I do not think are fully epithelialized. The tunnel that he had superiorly is no longer measurable. There is no surrounding infection. Electronic Signature(s) Signed: 06/16/2019 5:12:30 PM By: Benjamin Ham MD Entered By: Benjamin [name] on 06/16/2019 08:48:46 [name], [name] -------------------------------------------------------------------------------- Physician Orders Details Patient Name: [name], [name] Date of Service: 06/16/2019 8:15 AM Medical Record Number: [id number] Patient Account Number: [id number] Date of Birth/Sex: 12/21/1950 (68 y.o. M) Treating RN: Benjamin [name] Primary Care Provider: Salome [name] Other Clinician: Referring Provider: Salome [name] Treating Provider/Extender: Benjamin [name] in Treatment: 7 Verbal  / Phone Orders: No Diagnosis Coding Wound Cleansing Wound #1 Left,Anterior Lower Leg o Clean wound with Normal Saline. Anesthetic (add to Medication List) Wound #1 Left,Anterior Lower Leg o Topical Lidocaine 4% cream applied to wound bed prior to debridement (In Clinic Only). Skin Barriers/Peri-Wound Care Wound #1 Left,Anterior Lower Leg o Moisturizing lotion Primary Wound Dressing Wound #1 Left,Anterior Lower Leg o Silver Alginate Secondary Dressing Wound #1 Left,Anterior Lower Leg o ABD pad Dressing Change Frequency Wound #1 Left,Anterior Lower Leg o Change dressing every week Follow-up Appointments Wound #1 Left,Anterior Lower Leg o Return Appointment in 1 week. o Nurse Visit as needed Edema Control Wound #1 Left,Anterior Lower Leg o 3 Layer Compression System - Left Lower Extremity Electronic Signature(s) Signed: 06/16/2019 4:53:39 PM By: Benjamin [name] Signed: 06/16/2019 5:12:30 PM By: Benjamin Ham MD Entered By: Benjamin [name] on 06/16/2019 08:43:51 [name], [name] ([id number]) -------------------------------------------------------------------------------- Problem List Details Patient Name: [name], [name] Date of Service: 06/16/2019 8:15 AM Medical Record Number: [id number] Patient Account Number: [id number] Date of Birth/Sex: 06-27-1951 (68 y.o. M) Treating RN: Benjamin [name] Primary Care Provider: Salome [name] Other Clinician: Referring Provider: Salome [name] Treating Provider/Extender: Benjamin [name] in Treatment: 7 Active Problems ICD-10 Evaluated Encounter Code Description Active Date Today Diagnosis I87.2 Venous insufficiency (chronic) (peripheral) 04/26/2019 No Yes L97.822 Non-pressure chronic ulcer of other part of left lower leg with 04/26/2019 No Yes fat layer exposed I89.0 Lymphedema, not elsewhere classified 05/05/2019 No Yes Inactive Problems Resolved Problems Electronic Signature(s) Signed: 06/16/2019  5:12:30 PM By: Benjamin Ham MD Entered By: Benjamin [name] on 06/16/2019 08:43:37 [name], [name] ([id number]) -------------------------------------------------------------------------------- Progress Note Details Patient Name: [name], [name] Date of Service: 06/16/2019 8:15 AM Medical Record Number: [id number]  Patient Account Number: [id number] Date of Birth/Sex: 10/09/1951 (68 y.o. M) Treating RN: Benjamin [name] Primary Care Provider: Salome [name] Other Clinician: Referring Provider: Salome [name] Treating Provider/Extender: Benjamin [name] in Treatment: 7 Subjective History of Present Illness (HPI) 04/26/19 on evaluation today patient presents for initial evaluation our clinic concerning an issue that he has been having with the wound on the left anterior lower extremity which occurred in April 2020 as a result of a traumatic injury when he was working on a tree stump in his yard He states that the tree stump flew back and hit him in the shin causing a significant amount of bruising and abrasion even through his jeans. He did end up in the hospital for time where he was given antibiotics fortunately he's done with those at this point although he still appears to have a significant wound that drains quite a bit especially if he pushes at the 12 o'clock location he will have a significant amount of yellow drainage not really purulent noted. He is not have any significant pain at this time which is good news. He does state that he occasionally has gotten tissue as well as blood clots out of the wound. 05/05/2019; this is a patient who suffered a tree stump laceration injury on the left anterior mid tibia area. He was seen and admitted to clinic last week. Use silver alginate packing and a Kerlix Coban compression. I believe he took this off at some point and his wife put a gauze wrap on him. He complained about the wrap falling down. 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 His ABI in our clinic was noncompressible. He has 3 more days of Keflex which we apparently gave him last week. I do not see any cultures 6/24; traumatic wound on the left anterior mid tibia likely in the setting of chronic venous insufficiency with some degree of secondary lymphedema. He tolerated the 3 layer wrap I put him on him last week and the superior tunnel is gone from over 6 cm to 2.6 cm today. What I can see if the tissue actually looks quite good. We would using silver alginate as the primary dressing 7/1; traumatic wound on the left anterior mid tibia in the setting of chronic venous insufficiency mild degree of lymphedema. He is tolerating the 3 layer compression. He has a tunnel superiorly that continues to come in nicely. Otherwise the surface area of the wound looks about the same. Slight hyper granulation we have been using silver alginate 7/8; wound surface area is improving however the tunnel at 12:00 still is at 1.5 cm which is about the same as last week. This had previously come down nicely. We have been using silver alginate changed him to silver collagen 7/15. The wound has contracted nicely. Superior tunnel is even come in with a maximum depth of 0.7 cm almost down 50% from last week. We have been using silver collagen under compression 7/22; the wound is contracted nicely. The superior tunnel is now unmeasurable. We have been using silver collagen. No debridement is required 7/29; wound continues to contract. There was some eschar over the wound moving this still reveals some opening here somewhat wet. Silver collagen was stuck to the wound Objective Constitutional Sitting or standing Blood Pressure  is within target range for patient.. Pulse regular and within target  range for patient.. Temperature is normal and within the target range for the patient.Marland Kitchen appears in no distress. Benjamin [name], Benjamin Johari S. ([id number]) Vitals Time Taken: 8:24 AM, Height: 68 in, Weight: 275 lbs, BMI: 41.8, Temperature: 97.6 F, Pulse: 72 bpm, Respiratory Rate: 18 breaths/min, Blood Pressure: 130/70 mmHg. Eyes Conjunctivae clear. No discharge. Respiratory Respiratory effort is easy and symmetric bilaterally. Rate is normal at rest and on room air.. Cardiovascular Pedal pulses palpable and strong bilaterally.Marland Kitchen Psychiatric No evidence of depression, anxiety, or agitation. Calm, cooperative, and communicative. Appropriate interactions and affect.. General Notes: Wound exam; wound bed covered with dry skin and eschar. After this was removed there is still some areas that I do not think are fully epithelialized. The tunnel that he had superiorly is no longer measurable. There is no surrounding infection. Integumentary (Hair, Skin) No surrounding erythema. The tunnel superiorly is unmeasurable. Wound #1 status is Open. Original cause of wound was Trauma. The wound is located on the Left,Anterior Lower Leg. The wound measures 0.3cm length x 0.2cm width x 0.1cm depth; 0.047cm^2 area and 0.005cm^3 volume. There is Fat Layer (Subcutaneous Tissue) Exposed exposed. There is no tunneling or undermining noted. There is a small amount of serous drainage noted. The wound margin is flat and intact. There is large (67-100%) red granulation within the wound bed. There is a small (1-33%) amount of necrotic tissue within the wound bed including Adherent Slough. Assessment Active Problems ICD-10 Venous insufficiency (chronic) (peripheral) Non-pressure chronic ulcer of other part of left lower leg with fat layer exposed Lymphedema, not elsewhere classified Plan Wound Cleansing: Wound #1 Left,Anterior Lower Leg: Clean wound with Normal Saline. Anesthetic (add to Medication List): Wound #1  Left,Anterior Lower Leg: Topical Lidocaine 4% cream applied to wound bed prior to debridement (In Clinic Only). Skin Barriers/Peri-Wound Care: Wound #1 Left,Anterior Lower Leg: SEANPAUL, PREECE. ([id number]) Moisturizing lotion Primary Wound Dressing: Wound #1 Left,Anterior Lower Leg: Silver Alginate Secondary Dressing: Wound #1 Left,Anterior Lower Leg: ABD pad Dressing Change Frequency: Wound #1 Left,Anterior Lower Leg: Change dressing every week Follow-up Appointments: Wound #1 Left,Anterior Lower Leg: Return Appointment in 1 week. Nurse Visit as needed Edema Control: Wound #1 Left,Anterior Lower Leg: 3 Layer Compression System - Left Lower Extremity 1. Not quite closed. 2. The wound was moist somewhat wet. I put silver alginate on this under compression. Hopefully this will be closed next week. I still wanted him in compression. He does not have compression stockings Electronic Signature(s) Signed: 06/16/2019 5:12:30 PM By: Benjamin Ham MD Entered By: Benjamin [name] on 06/16/2019 08:49:40 [name], [name] ([id number]) -------------------------------------------------------------------------------- SuperBill Details Patient Name: Benjamin [name] Silos, Masson S. Date of Service: 06/16/2019 Medical Record Number: [id number] Patient Account Number: [id number] Date of Birth/Sex: 02/24/1951 (68 y.o. M) Treating RN: Benjamin [name] Primary Care Provider: Salome [name] Other Clinician: Referring Provider: Salome [name] Treating Provider/Extender: Benjamin [name] in Treatment: 7 Diagnosis Coding ICD-10 Codes Code Description I87.2 Venous insufficiency (chronic) (peripheral) L97.822 Non-pressure chronic ulcer of other part of left lower leg with fat layer exposed I89.0 Lymphedema, not elsewhere classified Facility Procedures CPT4 Code: [id number] Description: [phone] - WOUND CARE VISIT-LEV 2 EST PT Modifier: Quantity: 1 Physician Procedures CPT4 Code Description: [id number]  [id number] - WC PHYS LEVEL 3 - EST PT ICD-10 Diagnosis Description L97.822 Non-pressure chronic ulcer of other part of left lower leg wit I89.0 Lymphedema, not elsewhere classified I87.2 Venous insufficiency (chronic)  (peripheral) Modifier: h fat layer expos Quantity:  1 ed Electronic Signature(s) Signed: 06/16/2019 5:12:30 PM By: Benjamin Ham MD Entered By: Benjamin [name] on 06/16/2019 08:50:02

## 2019-06-17 NOTE — Progress Notes (Signed)
Benjamin [name], Benjamin [name] ([id number]) Visit Report for 06/16/2019 Arrival Information Details Patient Name: Benjamin [name], Benjamin [name]. Date of Service: 06/16/2019 8:15 AM Medical Record Number: [id number] Patient Account Number: [id number] Date of Birth/Sex: Feb 12, 1951 (68 y.o. M) Treating RN: Harold Barban Primary Care Katya Rolston: Salome Holmes Other Clinician: Referring Chrystel Barefield: Salome Holmes Treating Phill Steck/Extender: Tito Dine in Treatment: 7 Visit Information History Since Last Visit Added or deleted any medications: No Patient Arrived: Ambulatory Any new allergies or adverse reactions: No Arrival Time: 08:22 Had a fall or experienced change in No Accompanied By: self activities of daily living that may affect Transfer Assistance: None risk of falls: Patient Identification Verified: Yes Signs or symptoms of abuse/neglect since last visito No Secondary Verification Process Completed: Yes Hospitalized since last visit: No Implantable device outside of the clinic excluding No cellular tissue based products placed in the center since last visit: Has Dressing in Place as Prescribed: Yes Has Compression in Place as Prescribed: Yes Pain Present Now: No Electronic Signature(s) Signed: 06/16/2019 4:24:39 PM By: Lorine Bears RCP, RRT, CHT Entered By: Lorine Bears on 06/16/2019 08:24:09 Benjamin [name], Benjamin [name] ([id number]) -------------------------------------------------------------------------------- Clinic Level of Care Assessment Details Patient Name: [name], [name] Date of Service: 06/16/2019 8:15 AM Medical Record Number: [id number] Patient Account Number: [id number] Date of Birth/Sex: 12/20/1950 (68 y.o. M) Treating RN: Harold Barban Primary Care Jadrien Narine: Salome Holmes Other Clinician: Referring Shaddai Shapley: Salome Holmes Treating Duvan Mousel/Extender: Tito Dine in Treatment: 7 Clinic Level of Care Assessment Items TOOL 4  Quantity Score []  - Use when only an EandM is performed on FOLLOW-UP visit 0 ASSESSMENTS - Nursing Assessment / Reassessment X - Reassessment of Co-morbidities (includes updates in patient status) 1 10 X- 1 5 Reassessment of Adherence to Treatment Plan ASSESSMENTS - Wound and Skin Assessment / Reassessment X - Simple Wound Assessment / Reassessment - one wound 1 5 []  - 0 Complex Wound Assessment / Reassessment - multiple wounds []  - 0 Dermatologic / Skin Assessment (not related to wound area) ASSESSMENTS - Focused Assessment []  - Circumferential Edema Measurements - multi extremities 0 []  - 0 Nutritional Assessment / Counseling / Intervention []  - 0 Lower Extremity Assessment (monofilament, tuning fork, pulses) []  - 0 Peripheral Arterial Disease Assessment (using hand held doppler) ASSESSMENTS - Ostomy and/or Continence Assessment and Care []  - Incontinence Assessment and Management 0 []  - 0 Ostomy Care Assessment and Management (repouching, etc.) PROCESS - Coordination of Care X - Simple Patient / Family Education for ongoing care 1 15 []  - 0 Complex (extensive) Patient / Family Education for ongoing care []  - 0 Staff obtains Programmer, systems, Records, Test Results / Process Orders []  - 0 Staff telephones HHA, Nursing Homes / Clarify orders / etc []  - 0 Routine Transfer to another Facility (non-emergent condition) []  - 0 Routine Hospital Admission (non-emergent condition) []  - 0 New Admissions / Biomedical engineer / Ordering NPWT, Apligraf, etc. []  - 0 Emergency Hospital Admission (emergent condition) X- 1 10 Simple Discharge Coordination [name], [name] []  - 0 Complex (extensive) Discharge Coordination PROCESS - Special Needs []  - Pediatric / Minor Patient Management 0 []  - 0 Isolation Patient Management []  - 0 Hearing / Language / Visual special needs []  - 0 Assessment of Community assistance (transportation, D/C planning, etc.) []  -  0 Additional assistance / Altered mentation []  - 0 Support Surface(s) Assessment (bed, cushion, seat, etc.) INTERVENTIONS - Wound Cleansing / Measurement X - Simple Wound Cleansing - one wound 1 5 []  -  0 Complex Wound Cleansing - multiple wounds X- 1 5 Wound Imaging (photographs - any number of wounds) []  - 0 Wound Tracing (instead of photographs) X- 1 5 Simple Wound Measurement - one wound []  - 0 Complex Wound Measurement - multiple wounds INTERVENTIONS - Wound Dressings X - Small Wound Dressing one or multiple wounds 1 10 []  - 0 Medium Wound Dressing one or multiple wounds []  - 0 Large Wound Dressing one or multiple wounds []  - 0 Application of Medications - topical []  - 0 Application of Medications - injection INTERVENTIONS - Miscellaneous []  - External ear exam 0 []  - 0 Specimen Collection (cultures, biopsies, blood, body fluids, etc.) []  - 0 Specimen(s) / Culture(s) sent or taken to Lab for analysis []  - 0 Patient Transfer (multiple staff / Civil Service fast streamer / Similar devices) []  - 0 Simple Staple / Suture removal (25 or less) []  - 0 Complex Staple / Suture removal (26 or more) []  - 0 Hypo / Hyperglycemic Management (close monitor of Blood Glucose) []  - 0 Ankle / Brachial Index (ABI) - do not check if billed separately X- 1 5 Vital Signs Benjamin [name], [name] ([id number]) Has the patient been seen at the hospital within the last three years: Yes Total Score: 75 Level Of Care: New/Established - Level 2 Electronic Signature(s) Signed: 06/16/2019 4:53:39 PM By: Harold Barban Entered By: Harold Barban on 06/16/2019 08:42:50 Benjamin [name], [name] ([id number]) -------------------------------------------------------------------------------- Encounter Discharge Information Details Patient Name: Benjamin [name], [name] Date of Service: 06/16/2019 8:15 AM Medical Record Number: [id number] Patient Account Number: [id number] Date of Birth/Sex: 04-22-1951 (68 y.o. M) Treating RN:  Army Melia Primary Care Jaliyah Fotheringham: Salome Holmes Other Clinician: Referring Doralene Glanz: Salome Holmes Treating Ugo Thoma/Extender: Tito Dine in Treatment: 7 Encounter Discharge Information Items Discharge Condition: Stable Ambulatory Status: Ambulatory Discharge Destination: Home Transportation: Private Auto Accompanied By: spouse Schedule Follow-up Appointment: Yes Clinical Summary of Care: Electronic Signature(s) Signed: 06/16/2019 4:31:27 PM By: Army Melia Entered By: Army Melia on 06/16/2019 08:55:53 Benjamin [name], [name] ([id number]) -------------------------------------------------------------------------------- Lower Extremity Assessment Details Patient Name: Demonbreun, Willford S. Date of Service: 06/16/2019 8:15 AM Medical Record Number: [id number] Patient Account Number: [id number] Date of Birth/Sex: 1951/04/02 (68 y.o. M) Treating RN: Montey Hora Primary Care Necia Kamm: Salome Holmes Other Clinician: Referring Paquita Printy: Salome Holmes Treating Kerly Rigsbee/Extender: Tito Dine in Treatment: 7 Edema Assessment Assessed: [Left: No] [Right: No] Edema: [Left: Ye] [Right: s] Calf Left: Right: Point of Measurement: 33 cm From Medial Instep 44 cm cm Ankle Left: Right: Point of Measurement: 10 cm From Medial Instep 27 cm cm Vascular Assessment Pulses: Dorsalis Pedis Palpable: [Left:Yes] Electronic Signature(s) Signed: 06/16/2019 4:41:53 PM By: Montey Hora Entered By: Montey Hora on 06/16/2019 08:29:49 Benjamin [name], [name] ([id number]) -------------------------------------------------------------------------------- Multi Wound Chart Details Patient Name: Benjamin [name], [name] Date of Service: 06/16/2019 8:15 AM Medical Record Number: [id number] Patient Account Number: [id number] Date of Birth/Sex: 1951/02/20 (68 y.o. M) Treating RN: Harold Barban Primary Care Georgian Mcclory: Salome Holmes Other Clinician: Referring Enrika Aguado: Salome Holmes Treating Christabella Alvira/Extender: Tito Dine in Treatment: 7 Vital Signs Height(in): 68 Pulse(bpm): 72 Weight(lbs): 275 Blood Pressure(mmHg): 130/70 Body Mass Index(BMI): 42 Temperature(F): 97.6 Respiratory Rate 18 (breaths/min): Photos: [N/A:N/A] Wound Location: Left Lower Leg - Anterior N/A N/A Wounding Event: Trauma N/A N/A Primary Etiology: Trauma, Other N/A N/A Secondary Etiology: Venous Leg Ulcer N/A N/A Date Acquired: 03/11/2019 N/A N/A Weeks of Treatment: 7 N/A N/A Wound Status: Open N/A N/A Measurements L x W x D 0.3x0.2x0.1 N/A N/A (cm)  Area (cm) : 0.047 N/A N/A Volume (cm) : 0.005 N/A N/A % Reduction in Area: 98.70% N/A N/A % Reduction in Volume: 99.30% N/A N/A Classification: Partial Thickness N/A N/A Exudate Amount: Small N/A N/A Exudate Type: Serous N/A N/A Exudate Color: amber N/A N/A Wound Margin: Flat and Intact N/A N/A Granulation Amount: Large (67-100%) N/A N/A Granulation Quality: Red N/A N/A Necrotic Amount: Small (1-33%) N/A N/A Exposed Structures: Fat Layer (Subcutaneous N/A N/A Tissue) Exposed: Yes Fascia: No Tendon: No Muscle: No Joint: No Bone: No Epithelialization: Large (67-100%) N/A N/A Benjamin [name], Benjamin [name] ([id number]) Treatment Notes Electronic Signature(s) Signed: 06/16/2019 5:12:30 PM By: Linton Ham MD Entered By: Linton Ham on 06/16/2019 08:43:46 Genco, Atif Chauncey [name] ([id number]) -------------------------------------------------------------------------------- Multi-Disciplinary Care Plan Details Patient Name: Benjamin [name], Benjamin [name] S. Date of Service: 06/16/2019 8:15 AM Medical Record Number: [id number] Patient Account Number: [id number] Date of Birth/Sex: [birth year] (68 y.o. M) Treating RN: Harold Barban Primary Care Ravneet Spilker: Salome Holmes Other Clinician: Referring Dove Gresham: Salome Holmes Treating Bristyn Kulesza/Extender: Tito Dine in Treatment: 7 Active Inactive Abuse / Safety / Falls /  Self Care Management Nursing Diagnoses: Potential for falls Goals: Patient will remain injury free related to falls Date Initiated: 04/27/2019 Target Resolution Date: 07/24/2019 Goal Status: Active Interventions: Assess fall risk on admission and as needed Notes: Orientation to the Wound Care Program Nursing Diagnoses: Knowledge deficit related to the wound healing center program Goals: Patient/caregiver will verbalize understanding of the [place] Program Date Initiated: 04/27/2019 Target Resolution Date: 07/24/2019 Goal Status: Active Interventions: Provide education on orientation to the wound center Notes: Pain, Acute or Chronic Nursing Diagnoses: Pain, acute or chronic: actual or potential Goals: Patient will verbalize adequate pain control and receive pain control interventions during procedures as needed Date Initiated: 04/26/2019 Target Resolution Date: 05/28/2019 Goal Status: Active Interventions: Assess comfort goal upon admission MARTAVION, COUPER. ([id number]) Complete pain assessment as per visit requirements Notes: Wound/Skin Impairment Nursing Diagnoses: Impaired tissue integrity Goals: Ulcer/skin breakdown will have a volume reduction of 30% by week 4 Date Initiated: 04/26/2019 Target Resolution Date: 05/24/2019 Goal Status: Active Ulcer/skin breakdown will have a volume reduction of 50% by week 8 Date Initiated: 04/26/2019 Target Resolution Date: 06/21/2019 Goal Status: Active Interventions: Assess patient/caregiver ability to obtain necessary supplies Assess ulceration(s) every visit Notes: Electronic Signature(s) Signed: 06/16/2019 4:53:39 PM By: Harold Barban Entered By: Harold Barban on 06/16/2019 08:40:16 Deisher, Andros S. ([id number]) -------------------------------------------------------------------------------- Pain Assessment Details Patient Name: Benjamin [name], Ladamien S. Date of Service: 06/16/2019 8:15 AM Medical Record Number:  [id number] Patient Account Number: [id number] Date of Birth/Sex: 1951/10/15 (68 y.o. M) Treating RN: Harold Barban Primary Care Aragon Scarantino: Salome Holmes Other Clinician: Referring Vida Nicol: Salome Holmes Treating Kamilla Hands/Extender: Tito Dine in Treatment: 7 Active Problems Location of Pain Severity and Description of Pain Patient Has Paino No Site Locations Pain Management and Medication Current Pain Management: Electronic Signature(s) Signed: 06/16/2019 4:24:39 PM By: Lorine Bears RCP, RRT, CHT Signed: 06/16/2019 4:53:39 PM By: Harold Barban Entered By: Lorine Bears on 06/16/2019 08:24:17 Mauch, Johnathon Chauncey [name] ([id number]) -------------------------------------------------------------------------------- Patient/Caregiver Education Details Patient Name: BARANOWSKI, [name]. Date of Service: 06/16/2019 8:15 AM Medical Record Number: [id number] Patient Account Number: [id number] Date of Birth/Gender: 21-Sep-1951 (68 y.o. M) Treating RN: Harold Barban Primary Care Physician: Salome Holmes Other Clinician: Referring Physician: Salome Holmes Treating Physician/Extender: Tito Dine in Treatment: 7 Education Assessment Education Provided To: Patient Education Topics Provided Wound/Skin Impairment: Handouts: Caring for Your Ulcer Methods: Demonstration, Explain/Verbal Responses: State content correctly  Electronic Signature(s) Signed: 06/16/2019 4:53:39 PM By: Harold Barban Entered By: Harold Barban on 06/16/2019 08:40:42 Burgoon, Londyn Chauncey [name] ([id number]) -------------------------------------------------------------------------------- Wound Assessment Details Patient Name: Gaffin, Darrien S. Date of Service: 06/16/2019 8:15 AM Medical Record Number: [id number] Patient Account Number: [id number] Date of Birth/Sex: Jan 05, 1951 (68 y.o. M) Treating RN: Montey Hora Primary Care Dariah Mcsorley: Salome Holmes Other  Clinician: Referring Steaven Wholey: Salome Holmes Treating Danijela Vessey/Extender: Tito Dine in Treatment: 7 Wound Status Wound Number: 1 Primary Etiology: Trauma, Other Wound Location: Left Lower Leg - Anterior Secondary Etiology: Venous Leg Ulcer Wounding Event: Trauma Wound Status: Open Date Acquired: 03/11/2019 Weeks Of Treatment: 7 Clustered Wound: No Photos Wound Measurements Length: (cm) 0.3 % Reduction i Width: (cm) 0.2 % Reduction i Depth: (cm) 0.1 Epithelializa Area: (cm) 0.047 Tunneling: Volume: (cm) 0.005 Undermining: n Area: 98.7% n Volume: 99.3% tion: Large (67-100%) No No Wound Description Classification: Partial Thickness Foul Odor Af Wound Margin: Flat and Intact Slough/Fibri Exudate Amount: Small Exudate Type: Serous Exudate Color: amber ter Cleansing: No no Yes Wound Bed Granulation Amount: Large (67-100%) Exposed Structure Granulation Quality: Red Fascia Exposed: No Necrotic Amount: Small (1-33%) Fat Layer (Subcutaneous Tissue) Exposed: Yes Necrotic Quality: Adherent Slough Tendon Exposed: No Muscle Exposed: No Joint Exposed: No Bone Exposed: No Treatment Notes Faw, Sonam S. ([id number]) Wound #1 (Left, Anterior Lower Leg) Notes silver ag, abd, 3 layer wrap Electronic Signature(s) Signed: 06/16/2019 4:41:53 PM By: Montey Hora Entered By: Montey Hora on 06/16/2019 08:35:08 Luevano, [place]. ([id number]) -------------------------------------------------------------------------------- Vitals Details Patient Name: Padron, Ashanti S. Date of Service: 06/16/2019 8:15 AM Medical Record Number: [id number] Patient Account Number: [id number] Date of Birth/Sex: Mar 01, 1951 (68 y.o. M) Treating RN: Harold Barban Primary Care Montario Zilka: Salome Holmes Other Clinician: Referring Brahm Barbeau: Salome Holmes Treating Ayce Pietrzyk/Extender: Tito Dine in Treatment: 7 Vital Signs Time Taken: 08:24 Temperature (F):  97.6 Height (in): 68 Pulse (bpm): 72 Weight (lbs): 275 Respiratory Rate (breaths/min): 18 Body Mass Index (BMI): 41.8 Blood Pressure (mmHg): 130/70 Reference Range: 80 - 120 mg / dl Electronic Signature(s) Signed: 06/16/2019 4:24:39 PM By: Lorine Bears RCP, RRT, CHT Entered By: Becky Sax, Amado Nash on 06/16/2019 08:26:55

## 2019-06-23 ENCOUNTER — Encounter: Payer: Medicare Other | Attending: Internal Medicine | Admitting: Internal Medicine

## 2019-06-23 ENCOUNTER — Other Ambulatory Visit: Payer: Self-pay

## 2019-06-23 DIAGNOSIS — I872 Venous insufficiency (chronic) (peripheral): Secondary | ICD-10-CM | POA: Insufficient documentation

## 2019-06-23 DIAGNOSIS — L97822 Non-pressure chronic ulcer of other part of left lower leg with fat layer exposed: Secondary | ICD-10-CM | POA: Insufficient documentation

## 2019-06-23 DIAGNOSIS — I890 Lymphedema, not elsewhere classified: Secondary | ICD-10-CM | POA: Insufficient documentation

## 2019-06-25 NOTE — Progress Notes (Signed)
ELROY, SCHEMBRI ([id number]) Visit Report for 06/23/2019 HPI Details Patient Name: Benjamin [name], Benjamin [name]. Date of Service: 06/23/2019 8:15 AM Medical Record Number: [id number] Patient Account Number: [id number] Date of Birth/Sex: [birth year] (68 y.o. M) Treating RN: Cornell Barman Primary Care Provider: Salome Holmes Other Clinician: Referring Provider: Salome Holmes Treating Provider/Extender: Beverly Gust in Treatment: 8 History of Present Illness HPI Description: 04/26/19 on evaluation today patient presents for initial evaluation our clinic concerning an issue that he has been having with the wound on the left anterior lower extremity which occurred in April 2020 as a result of a traumatic injury when he was working on a tree stump in his yard He states that the tree stump flew back and hit him in the shin causing a significant amount of bruising and abrasion even through his jeans. He did end up in the hospital for time where he was given antibiotics fortunately he's done with those at this point although he still appears to have a significant wound that drains quite a bit especially if he pushes at the 12 o'clock location he will have a significant amount of yellow drainage not really purulent noted. He is not have any significant pain at this time which is good news. He does state that he occasionally has gotten tissue as well as blood clots out of the wound. 05/05/2019; this is a patient who suffered a tree stump laceration injury on the left anterior mid tibia area. He was seen and admitted to clinic last week. Use silver alginate packing and a Kerlix Coban compression. I believe he took this off at some point and his wife put a gauze wrap on him. He complained about the wrap falling down. 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 His ABI in our clinic was noncompressible. He has 3 more days of Keflex which we apparently gave him last week. I do not see any cultures 6/24; traumatic wound on the left anterior mid tibia likely in the setting of chronic venous insufficiency with some degree of secondary lymphedema. He tolerated the 3 layer wrap I put him on him last week and the superior tunnel is gone from over 6 cm to 2.6 cm today. What I can see if the tissue actually looks quite good. We would using silver alginate as the primary dressing 7/1; traumatic wound on the left anterior mid tibia in the setting of chronic venous insufficiency mild degree of lymphedema. He is tolerating the 3 layer compression. He has a tunnel superiorly that continues to come in nicely. Otherwise the surface area of the wound looks about the same. Slight hyper granulation we have been using silver alginate 7/8; wound surface area is improving however the tunnel at 12:00 still is at 1.5 cm which is about the same as last week. This had previously come down nicely. We have been using silver alginate changed him to silver collagen 7/15. The wound has contracted nicely. Superior tunnel is even come in with a maximum depth of 0.7 cm almost down 50% from last week. We have been using silver collagen under compression 7/22; the wound is contracted nicely. The superior tunnel is now unmeasurable. We have been using silver collagen. No debridement is required 7/29; wound continues to contract. There was some eschar over the  wound moving this still reveals some opening here somewhat wet. Silver collagen was stuck to the wound 8/5-Patient presents with 1 week, continuing and 3 layer compression with silver collagen primary dressing, his wound is almost closed doing much better Electronic Signature(s) Signed: 06/23/2019 8:45:48 AM By: Tobi Bastos Entered By: Tobi Bastos on 06/23/2019 08:45:47 Rogalski,  Savion S. ([id number]) -------------------------------------------------------------------------------- Physical Exam Details Patient Name: Tuckerman, Deandra S. Date of Service: 06/23/2019 8:15 AM Medical Record Number: [id number] Patient Account Number: [id number] Date of Birth/Sex: 30-Oct-1951 (68 y.o. M) Treating RN: Cornell Barman Primary Care Provider: Salome Holmes Other Clinician: Referring Provider: Salome Holmes Treating Provider/Extender: Beverly Gust in Treatment: 8 Constitutional alert and oriented x 3. sitting or standing blood pressure is within target range for patient.. supine blood pressure is within target range for patient.. pulse regular and within target range for patient.Marland Kitchen respirations regular, non-labored and within target range for patient.Marland Kitchen temperature within target range for patient.. . . Well-nourished and well-hydrated in no acute distress. Notes Left anterior leg wound is almost closed, there is some scaled covering on the wound itself that was removed leading to a small denuded area below the wound.Surrounding skin appears intact Electronic Signature(s) Signed: 06/23/2019 8:47:16 AM By: Tobi Bastos Previous Signature: 06/23/2019 8:46:51 AM Version By: Tobi Bastos Entered By: Tobi Bastos on 06/23/2019 08:47:16 Posa, Peretz S. ([id number]) -------------------------------------------------------------------------------- Physician Orders Details Patient Name: GIDNEY, Raiquan S. Date of Service: 06/23/2019 8:15 AM Medical Record Number: [id number] Patient Account Number: [id number] Date of Birth/Sex: February 20, 1951 (68 y.o. M) Treating RN: Cornell Barman Primary Care Provider: Salome Holmes Other Clinician: Referring Provider: Salome Holmes Treating Provider/Extender: Beverly Gust in Treatment: 8 Verbal / Phone Orders: No Diagnosis Coding Wound Cleansing Wound #1 Left,Anterior Lower Leg o Clean wound with Normal Saline. Anesthetic (add  to Medication List) Wound #1 Left,Anterior Lower Leg o Topical Lidocaine 4% cream applied to wound bed prior to debridement (In Clinic Only). Skin Barriers/Peri-Wound Care Wound #1 Left,Anterior Lower Leg o Moisturizing lotion Primary Wound Dressing Wound #1 Left,Anterior Lower Leg o Silver Alginate Secondary Dressing Wound #1 Left,Anterior Lower Leg o ABD pad Dressing Change Frequency Wound #1 Left,Anterior Lower Leg o Change dressing every week Follow-up Appointments Wound #1 Left,Anterior Lower Leg o Return Appointment in 1 week. o Nurse Visit as needed Edema Control Wound #1 Left,Anterior Lower Leg o 3 Layer Compression System - Left Lower Extremity Electronic Signature(s) Signed: 06/23/2019 4:03:26 PM By: Tobi Bastos Signed: 06/25/2019 5:41:45 PM By: Gretta Cool, BSN, RN, CWS, Kim RN, BSN Entered By: Gretta Cool, BSN, RN, CWS, Kim on 06/23/2019 08:41:59 Soulier, [name] ([id number]) -------------------------------------------------------------------------------- Progress Note Details Patient Name: Newcombe, Cammeron S. Date of Service: 06/23/2019 8:15 AM Medical Record Number: [id number] Patient Account Number: [id number] Date of Birth/Sex: September 02, 1951 (68 y.o. M) Treating RN: Cornell Barman Primary Care Provider: Salome Holmes Other Clinician: Referring Provider: Salome Holmes Treating Provider/Extender: Beverly Gust in Treatment: 8 Subjective History of Present Illness (HPI) 04/26/19 on evaluation today patient presents for initial evaluation our clinic concerning an issue that he has been having with the wound on the left anterior lower extremity which occurred in April 2020 as a result of a traumatic injury when he was working on a tree stump in his yard He states that the tree stump flew back and hit him in the shin causing a significant amount of bruising and abrasion even through his jeans. He did end up in the hospital for time where he was given  antibiotics fortunately he's done  with those at this point although he still appears to have a significant wound that drains quite a bit especially if he pushes at the 12 o'clock location he will have a significant amount of yellow drainage not really purulent noted. He is not have any significant pain at this time which is good news. He does state that he occasionally has gotten tissue as well as blood clots out of the wound. 05/05/2019; this is a patient who suffered a tree stump laceration injury on the left anterior mid tibia area. He was seen and admitted to clinic last week. Use silver alginate packing and a Kerlix Coban compression. I believe he took this off at some point and his wife put a gauze wrap on him. He complained about the wrap falling down. 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 His ABI in our clinic was noncompressible. He has 3 more days of Keflex which we apparently gave him last week. I do not see any cultures 6/24; traumatic wound on the left anterior mid tibia likely in the setting of chronic venous insufficiency with some degree of secondary lymphedema. He tolerated the 3 layer wrap I put him on him last week and the superior tunnel is gone from over 6 cm to 2.6 cm today. What I can see if the tissue actually looks quite good. We would using silver alginate as the primary dressing 7/1; traumatic wound on the left anterior mid tibia in the setting of chronic venous insufficiency mild degree of lymphedema. He is tolerating the 3 layer compression. He has a tunnel superiorly that continues to come in nicely. Otherwise the surface area of the wound looks about the same. Slight hyper granulation we have been using silver alginate 7/8; wound surface area is improving however the tunnel at  12:00 still is at 1.5 cm which is about the same as last week. This had previously come down nicely. We have been using silver alginate changed him to silver collagen 7/15. The wound has contracted nicely. Superior tunnel is even come in with a maximum depth of 0.7 cm almost down 50% from last week. We have been using silver collagen under compression 7/22; the wound is contracted nicely. The superior tunnel is now unmeasurable. We have been using silver collagen. No debridement is required 7/29; wound continues to contract. There was some eschar over the wound moving this still reveals some opening here somewhat wet. Silver collagen was stuck to the wound 8/5-Patient presents with 1 week, continuing and 3 layer compression with silver collagen primary dressing, his wound is almost closed doing much better Objective Constitutional Goings, [place]. ([id number]) alert and oriented x 3. sitting or standing blood pressure is within target range for patient.. supine blood pressure is within target range for patient.. pulse regular and within target range for patient.Marland Kitchen respirations regular, non-labored and within target range for patient.Marland Kitchen temperature within target range for patient.. Well-nourished and well-hydrated in no acute distress. Vitals Time Taken: 8:26 AM, Height: 68 in, Weight: 275 lbs, BMI: 41.8, Temperature: 98.3 F, Pulse: 84 bpm, Respiratory Rate: 16 breaths/min, Blood Pressure: 129/65 mmHg. General Notes: Left anterior leg wound is almost closed, there is some scaled covering on the wound itself that was removed leading to a small denuded area below the wound.Surrounding skin  appears intact Integumentary (Hair, Skin) Wound #1 status is Open. Original cause of wound was Trauma. The wound is located on the Left,Anterior Lower Leg. The wound measures 1.5cm length x 0.4cm width x 0.1cm depth; 0.471cm^2 area and 0.047cm^3 volume. There is Fat Layer (Subcutaneous Tissue) Exposed  exposed. There is no tunneling or undermining noted. There is a small amount of serous drainage noted. The wound margin is flat and intact. There is large (67-100%) red granulation within the wound bed. There is a small (1-33%) amount of necrotic tissue within the wound bed including Adherent Slough. Procedures Wound #1 Pre-procedure diagnosis of Wound #1 is a Trauma, Other located on the Left,Anterior Lower Leg . There was a Three Layer Compression Therapy Procedure by Cornell Barman, RN. Post procedure Diagnosis Wound #1: Same as Pre-Procedure Plan Wound Cleansing: Wound #1 Left,Anterior Lower Leg: Clean wound with Normal Saline. Anesthetic (add to Medication List): Wound #1 Left,Anterior Lower Leg: Topical Lidocaine 4% cream applied to wound bed prior to debridement (In Clinic Only). Skin Barriers/Peri-Wound Care: Wound #1 Left,Anterior Lower Leg: Moisturizing lotion Primary Wound Dressing: Wound #1 Left,Anterior Lower Leg: Silver Alginate Secondary Dressing: Wound #1 Left,Anterior Lower Leg: ABD pad Dressing Change Frequency: Wound #1 Left,Anterior Lower Leg: Change dressing every week Follow-up Appointments: Wound #1 Left,Anterior Lower Leg: Return Appointment in 1 week. SHARRON, SIMPSON ([id number]) Nurse Visit as needed Edema Control: Wound #1 Left,Anterior Lower Leg: 3 Layer Compression System - Left Lower Extremity 1. Continue 3 layer compression with silver collagen under the wrap 2. Return to clinic next week as planned, expect this wound to be healed up by then Electronic Signature(s) Signed: 06/23/2019 8:47:53 AM By: Tobi Bastos Entered By: Tobi Bastos on 06/23/2019 08:47:53 Magel, Kimmie S. ([id number]) -------------------------------------------------------------------------------- SuperBill Details Patient Name: Padin, [place] S. Date of Service: 06/23/2019 Medical Record Number: [id number] Patient Account Number: [id number] Date of Birth/Sex:  30-Jun-1951 (68 y.o. M) Treating RN: Cornell Barman Primary Care Provider: Salome Holmes Other Clinician: Referring Provider: Salome Holmes Treating Provider/Extender: Beverly Gust in Treatment: 8 Diagnosis Coding ICD-10 Codes Code Description I87.2 Venous insufficiency (chronic) (peripheral) L97.822 Non-pressure chronic ulcer of other part of left lower leg with fat layer exposed I89.0 Lymphedema, not elsewhere classified Facility Procedures CPT4 Code: [id number] Description: (Facility Use Only) [phone] - [place] LWR LT LEG Modifier: Quantity: 1 Physician Procedures CPT4 Code Description: [id number] [facility] - WC PHYS LEVEL 3 - EST PT ICD-10 Diagnosis Description L97.822 Non-pressure chronic ulcer of other part of left lower leg wit Modifier: h fat layer expos Quantity: 1 ed Electronic Signature(s) Signed: 06/23/2019 8:48:11 AM By: Tobi Bastos Entered By: Tobi Bastos on 06/23/2019 08:48:11

## 2019-06-25 NOTE — Progress Notes (Signed)
Benjamin, [name] ([id number]) Visit Report for 06/23/2019 Arrival Information Details Patient Name: Benjamin [name], Benjamin [name]. Date of Service: 06/23/2019 8:15 AM Medical Record Number: [id number] Patient Account Number: [id number] Date of Birth/Sex: 04/30/1951 (68 y.o. M) Treating RN: Benjamin [name] Primary Care Benjamin [name]: Benjamin [name] Other Clinician: Referring Floella Ensz: Benjamin [name] Treating Benjamin [name]/Extender: Benjamin [name] in Treatment: 8 Visit Information History Since Last Visit Added or deleted any medications: No Patient Arrived: Ambulatory Any new allergies or adverse reactions: No Arrival Time: 08:26 Had a fall or experienced change in No Accompanied By: wife activities of daily living that may affect Transfer Assistance: None risk of falls: Signs or symptoms of abuse/neglect since last visito No Hospitalized since last visit: No Has Dressing in Place as Prescribed: Yes Pain Present Now: No Electronic Signature(s) Signed: 06/23/2019 10:39:52 AM By: Benjamin [name] Entered By: Benjamin [name] on 06/23/2019 08:26:38 Benjamin [name], [name] ([id number]) -------------------------------------------------------------------------------- Compression Therapy Details Patient Name: Benjamin [name], [name] Date of Service: 06/23/2019 8:15 AM Medical Record Number: [id number] Patient Account Number: [id number] Date of Birth/Sex: 04-Jul-1951 (68 y.o. M) Treating RN: Benjamin [name] Primary Care Alyscia Carmon: Benjamin [name] Other Clinician: Referring Charly Hunton: Benjamin [name] Treating Aroura Vasudevan/Extender: Benjamin [name] in Treatment: 8 Compression Therapy Performed for Wound Assessment: Wound #1 Left,Anterior Lower Leg Performed By: Clinician Benjamin Barman, RN Compression Type: Three Layer Post Procedure Diagnosis Same as Pre-procedure Electronic Signature(s) Signed: 06/25/2019 5:41:45 PM By: Benjamin [name], BSN, RN, CWS, Kim RN, BSN Entered By: Benjamin [name] on 06/23/2019 08:44:22 Benjamin [name] ([id number]) -------------------------------------------------------------------------------- Encounter Discharge Information Details Patient Name: Benjamin [name], [name] Date of Service: 06/23/2019 8:15 AM Medical Record Number: [id number] Patient Account Number: [id number] Date of Birth/Sex: 07-Oct-1951 (68 y.o. M) Treating RN: Benjamin [name] Primary Care Jasleen Riepe: Benjamin [name] Other Clinician: Referring Laurielle Selmon: Benjamin [name] Treating Ranon Coven/Extender: Benjamin [name] in Treatment: 8 Encounter Discharge Information Items Discharge Condition: Stable Ambulatory Status: Ambulatory Discharge Destination: Home Transportation: Private Auto Accompanied By: wife Schedule Follow-up Appointment: Yes Clinical Summary of Care: Electronic Signature(s) Signed: 06/25/2019 5:41:45 PM By: Benjamin [name], BSN, RN, CWS, Kim RN, BSN Entered By: Benjamin [name] on 06/23/2019 08:46:21 Benjamin [name] ([id number]) -------------------------------------------------------------------------------- Lower Extremity Assessment Details Patient Name: Benjamin [name], [name] Date of Service: 06/23/2019 8:15 AM Medical Record Number: [id number] Patient Account Number: [id number] Date of Birth/Sex: Mar 06, 1951 (68 y.o. M) Treating RN: Benjamin [name] Primary Care Brenton Joines: Benjamin [name] Other Clinician: Referring Benjamin [name]: Benjamin [name] Treating Benjamin [name]: Benjamin [name] in Treatment: 8 Edema Assessment Assessed: [Left: No] [Right: No] [Left: Edema] [Right: :] Calf Left: Right: Point of Measurement: 33 cm From Medial Instep 44 cm cm Ankle Left: Right: Point of Measurement: 10 cm From Medial Instep 26.5 cm cm Vascular Assessment Pulses: Dorsalis Pedis Palpable: [Left:Yes] Posterior Tibial Palpable: [Left:Yes] Electronic Signature(s) Signed: 06/23/2019 10:39:52 AM By: Benjamin [name] Entered By: Benjamin [name] on 06/23/2019 08:34:22 [name], [name]  ([id number]) -------------------------------------------------------------------------------- Multi Wound Chart Details Patient Name: Benjamin [name], [name] Date of Service: 06/23/2019 8:15 AM Medical Record Number: [id number] Patient Account Number: [id number] Date of Birth/Sex: 09-28-1951 (68 y.o. M) Treating RN: Benjamin [name] Primary Care Connell Bognar: Benjamin [name] Other Clinician: Referring Benjamin [name]: Benjamin [name] Treating Benjamin [name]/Extender: Benjamin [name] in Treatment: 8 Vital Signs Height(in): 68 Pulse(bpm): 84 Weight(lbs): 275 Blood Pressure(mmHg): 129/65 Body Mass Index(BMI): 42 Temperature(F): 98.3 Respiratory Rate 16 (breaths/min): Photos: [N/A:N/A] Wound Location: Left Lower Leg - Anterior N/A N/A Wounding Event: Trauma N/A N/A Primary Etiology: Trauma, Other N/A N/A Secondary Etiology: Venous Leg Ulcer N/A  N/A Date Acquired: 03/11/2019 N/A N/A Weeks of Treatment: 8 N/A N/A Wound Status: Open N/A N/A Measurements L x W x D 1.5x0.4x0.1 N/A N/A (cm) Area (cm) : 0.471 N/A N/A Volume (cm) : 0.047 N/A N/A % Reduction in Area: 86.70% N/A N/A % Reduction in Volume: 93.40% N/A N/A Classification: Partial Thickness N/A N/A Exudate Amount: Small N/A N/A Exudate Type: Serous N/A N/A Exudate Color: amber N/A N/A Wound Margin: Flat and Intact N/A N/A Granulation Amount: Large (67-100%) N/A N/A Granulation Quality: Red N/A N/A Necrotic Amount: Small (1-33%) N/A N/A Exposed Structures: Fat Layer (Subcutaneous N/A N/A Tissue) Exposed: Yes Fascia: No Tendon: No Muscle: No Joint: No Bone: No Epithelialization: Large (67-100%) N/A N/A Benjamin [name] ([id number]) Treatment Notes Electronic Signature(s) Signed: 06/25/2019 5:41:45 PM By: Benjamin [name], BSN, RN, CWS, Kim RN, BSN Entered By: Benjamin [name] on 06/23/2019 08:41:16 Benjamin [name] ([id number]) -------------------------------------------------------------------------------- [place] Details Patient Name: Benjamin [name], [name] Date of Service: 06/23/2019 8:15 AM Medical Record Number: [id number] Patient Account Number: [id number] Date of Birth/Sex: 03-28-1951 (68 y.o. M) Treating RN: Benjamin [name] Primary Care Tanieka Pownall: Benjamin [name] Other Clinician: Referring Nakota Ackert: Benjamin [name] Treating Darleene Cumpian/Extender: Benjamin [name] in Treatment: 8 Active Inactive Abuse / Safety / Falls / Self Care Management Nursing Diagnoses: Potential for falls Goals: Patient will remain injury free related to falls Date Initiated: 04/27/2019 Target Resolution Date: 07/24/2019 Goal Status: Active Interventions: Assess fall risk on admission and as needed Notes: Orientation to the Wound Care Program Nursing Diagnoses: Knowledge deficit related to the wound healing center program Goals: Patient/caregiver will verbalize understanding of the [facility] Date Initiated: 04/27/2019 Target Resolution Date: 07/24/2019 Goal Status: Active Interventions: Provide education on orientation to the wound center Notes: Pain, Acute or Chronic Nursing Diagnoses: Pain, acute or chronic: actual or potential Goals: Patient will verbalize adequate pain control and receive pain control interventions during procedures as needed Date Initiated: 04/26/2019 Target Resolution Date: 05/28/2019 Goal Status: Active Interventions: Assess comfort goal upon admission LASON, EVELAND. ([id number]) Complete pain assessment as per visit requirements Notes: Wound/Skin Impairment Nursing Diagnoses: Impaired tissue integrity Goals: Ulcer/skin breakdown will have a volume reduction of 30% by week 4 Date Initiated: 04/26/2019 Target Resolution Date: 05/24/2019 Goal Status: Active Ulcer/skin breakdown will have a volume reduction of 50% by week 8 Date Initiated: 04/26/2019 Target Resolution Date: 06/21/2019 Goal Status: Active Interventions: Assess patient/caregiver ability to obtain necessary  supplies Assess ulceration(s) every visit Notes: Electronic Signature(s) Signed: 06/25/2019 5:41:45 PM By: Benjamin [name], BSN, RN, CWS, Kim RN, BSN Entered By: Benjamin [name] on 06/23/2019 08:41:06 Palmieri, Cain Chauncey Cruel ([id number]) -------------------------------------------------------------------------------- Pain Assessment Details Patient Name: Benjamin [name], [name] Date of Service: 06/23/2019 8:15 AM Medical Record Number: [id number] Patient Account Number: [id number] Date of Birth/Sex: October 22, 1951 (68 y.o. M) Treating RN: Benjamin [name] Primary Care Jebediah Macrae: Benjamin [name] Other Clinician: Referring Filemon Breton: Benjamin [name] Treating Maryln Eastham/Extender: Benjamin [name] in Treatment: 8 Active Problems Location of Pain Severity and Description of Pain Patient Has Paino No Site Locations Pain Management and Medication Current Pain Management: Electronic Signature(s) Signed: 06/23/2019 10:39:52 AM By: Benjamin [name] Entered By: Benjamin [name] on 06/23/2019 08:26:47 Pavey, Prestyn Chauncey Cruel ([id number]) -------------------------------------------------------------------------------- Patient/Caregiver Education Details Patient Name: Benjamin [name], [name] Date of Service: 06/23/2019 8:15 AM Medical Record Number: [id number] Patient Account Number: [id number] Date of Birth/Gender: [birth year] (68 y.o. M) Treating RN: Benjamin [name] Primary Care Physician: Benjamin [name] Other Clinician: Referring Physician: Salome [name] Treating Physician/Extender: Tobi Bastos  Weeks in Treatment: 8 Education Assessment Education Provided To: Patient Education Topics Provided Wound/Skin Impairment: Handouts: Caring for Your Ulcer Methods: Demonstration, Explain/Verbal Responses: State content correctly Electronic Signature(s) Signed: 06/25/2019 5:41:45 PM By: Benjamin [name], BSN, RN, CWS, Kim RN, BSN Entered By: Benjamin [name] on 06/23/2019 08:45:41 Iwata, Benjamin [name]  ([id number]) -------------------------------------------------------------------------------- Wound Assessment Details Patient Name: Benjamin [name], [name] Date of Service: 06/23/2019 8:15 AM Medical Record Number: [id number] Patient Account Number: [id number] Date of Birth/Sex: [birth year] (68 y.o. M) Treating RN: Benjamin [name] Primary Care Taitum Menton: Benjamin [name] Other Clinician: Referring Jachin Coury: Benjamin [name] Treating Kaisen Ackers/Extender: Benjamin [name] in Treatment: 8 Wound Status Wound Number: 1 Primary Etiology: Trauma, Other Wound Location: Left Lower Leg - Anterior Secondary Etiology: Venous Leg Ulcer Wounding Event: Trauma Wound Status: Open Date Acquired: 03/11/2019 Weeks Of Treatment: 8 Clustered Wound: No Photos Wound Measurements Length: (cm) 1.5 % Reduction i Width: (cm) 0.4 % Reduction i Depth: (cm) 0.1 Epithelializa Area: (cm) 0.471 Tunneling: Volume: (cm) 0.047 Undermining: n Area: 86.7% n Volume: 93.4% tion: Large (67-100%) No No Wound Description Classification: Partial Thickness Foul Odor Aft Wound Margin: Flat and Intact Slough/Fibrin Exudate Amount: Small Exudate Type: Serous Exudate Color: amber er Cleansing: No o Yes Wound Bed Granulation Amount: Large (67-100%) Exposed Structure Granulation Quality: Red Fascia Exposed: No Necrotic Amount: Small (1-33%) Fat Layer (Subcutaneous Tissue) Exposed: Yes Necrotic Quality: Adherent Slough Tendon Exposed: No Muscle Exposed: No Joint Exposed: No Bone Exposed: No Treatment Notes Benjamin [name], [name] ([id number]) Wound #1 (Left, Anterior Lower Leg) Notes silver ag, abd, 3 layer wrap Electronic Signature(s) Signed: 06/23/2019 10:39:52 AM By: Benjamin [name] Entered By: Benjamin [name] on 06/23/2019 08:33:43 Benjamin [name], [name] ([id number]) -------------------------------------------------------------------------------- Vitals Details Patient Name: Benjamin [name], [name] Date of Service: 06/23/2019  8:15 AM Medical Record Number: [id number] Patient Account Number: [id number] Date of Birth/Sex: 1951/04/24 (68 y.o. M) Treating RN: Benjamin [name] Primary Care Cortlandt Capuano: Benjamin [name] Other Clinician: Referring Abdurahman Rugg: Benjamin [name] Treating Adilynn Bessey/Extender: Benjamin [name] in Treatment: 8 Vital Signs Time Taken: 08:26 Temperature (F): 98.3 Height (in): 68 Pulse (bpm): 84 Weight (lbs): 275 Respiratory Rate (breaths/min): 16 Body Mass Index (BMI): 41.8 Blood Pressure (mmHg): 129/65 Reference Range: 80 - 120 mg / dl Electronic Signature(s) Signed: 06/23/2019 10:39:52 AM By: Benjamin [name] Entered By: Benjamin [name] on 06/23/2019 08:27:05

## 2019-07-01 ENCOUNTER — Other Ambulatory Visit: Payer: Self-pay

## 2019-07-01 ENCOUNTER — Encounter: Payer: Medicare Other | Admitting: Physician Assistant

## 2019-07-01 DIAGNOSIS — L97822 Non-pressure chronic ulcer of other part of left lower leg with fat layer exposed: Secondary | ICD-10-CM | POA: Diagnosis not present

## 2019-07-01 NOTE — Progress Notes (Addendum)
Benjamin, [name] ([id number]) Visit Report for 07/01/2019 Chief Complaint Document Details Patient Name: Benjamin, [name]. Date of Service: 07/01/2019 10:15 AM Medical Record Number: [id number] Patient Account Number: [id number] Date of Birth/Sex: 05-Mar-1951 (68 y.o. M) Treating RN: Army Melia Primary Care Provider: Salome Holmes Other Clinician: Referring Provider: Salome Holmes Treating Provider/Extender: Melburn Hake, Tayshun Gappa Weeks in Treatment: 9 Information Obtained from: Patient Chief Complaint Left leg ulcer Electronic Signature(s) Signed: 07/01/2019 10:39:25 AM By: Worthy Keeler PA-C Entered By: Worthy Keeler on 07/01/2019 10:39:25 Narayan, Benjamin [name] ([id number]) -------------------------------------------------------------------------------- HPI Details Patient Name: Benjamin [name], [name] Date of Service: 07/01/2019 10:15 AM Medical Record Number: [id number] Patient Account Number: [id number] Date of Birth/Sex: [birth year] (68 y.o. M) Treating RN: Army Melia Primary Care Provider: Salome Holmes Other Clinician: Referring Provider: Salome Holmes Treating Provider/Extender: Melburn Hake, Nailyn Dearinger Weeks in Treatment: 9 History of Present Illness HPI Description: 04/26/19 on evaluation today patient presents for initial evaluation our clinic concerning an issue that he has been having with the wound on the left anterior lower extremity which occurred in April 2020 as a result of a traumatic injury when he was working on a tree stump in his yard He states that the tree stump flew back and hit him in the shin causing a significant amount of bruising and abrasion even through his jeans. He did end up in the hospital for time where he was given antibiotics fortunately he's done with those at this point although he still appears to have a significant wound that drains quite a bit especially if he pushes at the 12 o'clock location he will have a significant amount of yellow drainage not  really purulent noted. He is not have any significant pain at this time which is good news. He does state that he occasionally has gotten tissue as well as blood clots out of the wound. 05/05/2019; this is a patient who suffered a tree stump laceration injury on the left anterior mid tibia area. He was seen and admitted to clinic last week. Use silver alginate packing and a Kerlix Coban compression. I believe he took this off at some point and his wife put a gauze wrap on him. He complained about the wrap falling down. 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 His ABI in our clinic was noncompressible. He has 3 more days of Keflex which we apparently gave him last week. I do not see any cultures 6/24; traumatic wound on the left anterior mid tibia likely in the setting of chronic venous insufficiency with some degree of secondary lymphedema. He tolerated the 3 layer wrap I put him on him last week and the superior tunnel is gone from over 6 cm to 2.6 cm today. What I can see if the tissue actually looks quite good. We would using silver alginate as the primary dressing 7/1; traumatic wound on the left anterior mid tibia in the setting of chronic venous insufficiency mild degree of lymphedema. He is tolerating the 3 layer compression. He has a tunnel superiorly that continues to come in nicely. Otherwise the surface area of the wound looks about the same. Slight hyper granulation we have been using silver alginate 7/8; wound surface area is improving however the tunnel at 12:00 still is at 1.5 cm which is about  the same as last week. This had previously come down nicely. We have been using silver alginate changed him to silver collagen 7/15. The wound has contracted nicely. Superior tunnel is even come in with a  maximum depth of 0.7 cm almost down 50% from last week. We have been using silver collagen under compression 7/22; the wound is contracted nicely. The superior tunnel is now unmeasurable. We have been using silver collagen. No debridement is required 7/29; wound continues to contract. There was some eschar over the wound moving this still reveals some opening here somewhat wet. Silver collagen was stuck to the wound 8/5-Patient presents with 1 week, continuing and 3 layer compression with silver collagen primary dressing, his wound is almost closed doing much better 07/01/2019 on evaluation today patient actually appears to be doing quite well with regard to his left lower extremity ulcer. He has been tolerating the dressing changes without complication. Fortunately there is no signs of active infection at this time. No fevers, chills, nausea, vomiting, or diarrhea. Electronic Signature(s) Signed: 07/01/2019 11:03:59 AM By: Worthy Keeler PA-C Entered By: Worthy Keeler on 07/01/2019 11:03:59 Benjamin [name] ([id number]) -------------------------------------------------------------------------------- Physical Exam Details Patient Name: [name], [name] Date of Service: 07/01/2019 10:15 AM Medical Record Number: [id number] Patient Account Number: [id number] Date of Birth/Sex: 1951-02-15 (68 y.o. M) Treating RN: Army Melia Primary Care Provider: Salome Holmes Other Clinician: Referring Provider: Salome Holmes Treating Provider/Extender: Melburn Hake, Jasmarie Coppock Weeks in Treatment: 9 Constitutional Well-nourished and well-hydrated in no acute distress. Respiratory normal breathing without difficulty. Psychiatric this patient is able to make decisions and demonstrates good insight into disease process. Alert and Oriented x 3. pleasant and cooperative. Notes Upon inspection today patient's wound actually appears to be doing excellent. Fortunately there is no signs of active infection at  this time. No fever chills noted. He has been tolerating the dressing changes without complication. Electronic Signature(s) Signed: 07/01/2019 11:10:38 AM By: Worthy Keeler PA-C Entered By: Worthy Keeler on 07/01/2019 11:10:37 Benjamin [name] ([id number]) -------------------------------------------------------------------------------- Physician Orders Details Patient Name: Inda Castle. Date of Service: 07/01/2019 10:15 AM Medical Record Number: [id number] Patient Account Number: [id number] Date of Birth/Sex: [birth year] (68 y.o. M) Treating RN: Army Melia Primary Care Provider: Salome Holmes Other Clinician: Referring Provider: Salome Holmes Treating Provider/Extender: Melburn Hake, Donnalyn Juran Weeks in Treatment: 9 Verbal / Phone Orders: No Diagnosis Coding ICD-10 Coding Code Description I87.2 Venous insufficiency (chronic) (peripheral) L97.822 Non-pressure chronic ulcer of other part of left lower leg with fat layer exposed I89.0 Lymphedema, not elsewhere classified Discharge From [place] o Discharge from [place] complete Electronic Signature(s) Signed: 07/01/2019 11:56:24 AM By: Army Melia Signed: 07/01/2019 10:42:11 PM By: Worthy Keeler PA-C Entered By: Army Melia on 07/01/2019 10:44:48 [name], [name] ([id number]) -------------------------------------------------------------------------------- Problem List Details Patient Name: [name], [name] Date of Service: 07/01/2019 10:15 AM Medical Record Number: [id number] Patient Account Number: [id number] Date of Birth/Sex: Nov 30, 1950 (68 y.o. M) Treating RN: Army Melia Primary Care Provider: Salome Holmes Other Clinician: Referring Provider: Salome Holmes Treating Provider/Extender: Melburn Hake, Kameron Blethen Weeks in Treatment: 9 Active Problems ICD-10 Evaluated Encounter Code Description Active Date Today Diagnosis I87.2 Venous insufficiency (chronic) (peripheral) 04/26/2019 No  Yes L97.822 Non-pressure chronic ulcer of other part of left lower leg with 04/26/2019 No Yes fat layer exposed I89.0 Lymphedema, not elsewhere classified 05/05/2019 No Yes Inactive Problems Resolved Problems Electronic Signature(s) Signed: 07/01/2019 10:39:16 AM By: Worthy Keeler PA-C Entered By: Melburn Hake,  Aesha Agrawal on 07/01/2019 10:39:16 Ogando, Razi S. ([id number]) -------------------------------------------------------------------------------- Progress Note Details Patient Name: [name], [name] Date of Service: 07/01/2019 10:15 AM Medical Record Number: [id number] Patient Account Number: [id number] Date of Birth/Sex: 09/18/1951 (68 y.o. M) Treating RN: Army Melia Primary Care Provider: Salome Holmes Other Clinician: Referring Provider: Salome Holmes Treating Provider/Extender: Melburn Hake, Zniya Cottone Weeks in Treatment: 9 Subjective Chief Complaint Information obtained from Patient Left leg ulcer History of Present Illness (HPI) 04/26/19 on evaluation today patient presents for initial evaluation our clinic concerning an issue that he has been having with the wound on the left anterior lower extremity which occurred in April 2020 as a result of a traumatic injury when he was working on a tree stump in his yard He states that the tree stump flew back and hit him in the shin causing a significant amount of bruising and abrasion even through his jeans. He did end up in the hospital for time where he was given antibiotics fortunately he's done with those at this point although he still appears to have a significant wound that drains quite a bit especially if he pushes at the 12 o'clock location he will have a significant amount of yellow drainage not really purulent noted. He is not have any significant pain at this time which is good news. He does state that he occasionally has gotten tissue as well as blood clots out of the wound. 05/05/2019; this is a patient who suffered a tree stump  laceration injury on the left anterior mid tibia area. He was seen and admitted to clinic last week. Use silver alginate packing and a Kerlix Coban compression. I believe he took this off at some point and his wife put a gauze wrap on him. He complained about the wrap falling down. 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 His ABI in our clinic was noncompressible. He has 3 more days of Keflex which we apparently gave him last week. I do not see any cultures 6/24; traumatic wound on the left anterior mid tibia likely in the setting of chronic venous insufficiency with some degree of secondary lymphedema. He tolerated the 3 layer wrap I put him on him last week and the superior tunnel is gone from over 6 cm to 2.6 cm today. What I can see if the tissue actually looks quite good. We would using silver alginate as the primary dressing 7/1; traumatic wound on the left anterior mid tibia in the setting of chronic venous insufficiency mild degree of lymphedema. He is tolerating the 3 layer compression. He has a tunnel superiorly that continues to come in nicely. Otherwise the surface area of the wound looks about the same. Slight hyper granulation we have been using silver alginate 7/8; wound surface area is improving however the tunnel at 12:00 still is at 1.5 cm which is about the same as last week. This had previously come down nicely. We have been using silver alginate changed him to silver collagen 7/15. The wound has contracted nicely. Superior tunnel is even come in with a maximum depth of 0.7 cm almost down 50% from last week. We have been using silver collagen under compression 7/22; the wound is contracted nicely. The superior tunnel is now unmeasurable. We have been using silver collagen. No debridement  is required 7/29; wound continues to contract. There was some eschar over the wound moving this still reveals some opening here somewhat wet. Silver collagen was stuck to the wound 8/5-Patient presents with 1 week, continuing and 3 layer compression with silver collagen primary dressing, his wound is almost closed doing much better 07/01/2019 on evaluation today patient actually appears to be doing quite well with regard to his left lower extremity ulcer. He has been tolerating the dressing changes without complication. Fortunately there is no signs of active infection at this time. No fevers, chills, nausea, vomiting, or diarrhea. Patient History BUTCH, OTTERSON ([id number]) Information obtained from Patient. Family History Cancer - Maternal Grandparents,Paternal Grandparents, No family history of Diabetes, Heart Disease, Hereditary Spherocytosis, Hypertension, Kidney Disease, Lung Disease, Seizures, Stroke, Thyroid Problems, Tuberculosis. Social History Never smoker, Marital Status - Married, Alcohol Use - Never, Drug Use - No History, Caffeine Use - Daily - coffee. Medical History Oncologic Denies history of Received Chemotherapy, Received Radiation Hospitalization/Surgery History - [facility] 04/07/2019. Review of Systems (ROS) Constitutional Symptoms (General Health) Denies complaints or symptoms of Fatigue, Fever, Chills, Marked Weight Change. Respiratory Denies complaints or symptoms of Chronic or frequent coughs, Shortness of Breath. Cardiovascular Complains or has symptoms of LE edema. Denies complaints or symptoms of Chest pain. Psychiatric Denies complaints or symptoms of Anxiety, Claustrophobia. Objective Constitutional Well-nourished and well-hydrated in no acute distress. Vitals Time Taken: 10:25 AM, Height: 68 in, Weight: 275 lbs, BMI: 41.8, Temperature: 98.5 F, Pulse: 81 bpm, Respiratory Rate: 16 breaths/min, Blood Pressure: 130/69  mmHg. Respiratory normal breathing without difficulty. Psychiatric this patient is able to make decisions and demonstrates good insight into disease process. Alert and Oriented x 3. pleasant and cooperative. General Notes: Upon inspection today patient's wound actually appears to be doing excellent. Fortunately there is no signs of active infection at this time. No fever chills noted. He has been tolerating the dressing changes without complication. Integumentary (Hair, Skin) Wound #1 status is Open. Original cause of wound was Trauma. The wound is located on the Left,Anterior Lower Leg. The wound measures 0.1cm length x 0.1cm width x 0.1cm depth; 0.008cm^2 area and 0.001cm^3 volume. There is Fat Layer (Subcutaneous Tissue) Exposed exposed. There is no tunneling or undermining noted. There is a none present amount of Benjamin [name], [name] ([id number]) drainage noted. The wound margin is flat and intact. There is no granulation within the wound bed. There is a large (67-100%) amount of necrotic tissue within the wound bed including Eschar. Assessment Active Problems ICD-10 Venous insufficiency (chronic) (peripheral) Non-pressure chronic ulcer of other part of left lower leg with fat layer exposed Lymphedema, not elsewhere classified Plan Discharge From [facility]: Discharge from [place] complete 1. At this time I think the biggest issue is that we are going to need to see about getting him some chronic compression ongoing. He does want to see about ordering the [place] wrap 4000 since this is something that will help keep the swelling under better control and again a little bit better than a compression stocking in particular. 2. With regard to the wound itself I really do not think any dressing needs to needs to be applied at this point to the wound bed since it seems to be doing so well I do think that using double layer Tubigrip would be beneficial just to keep the  swelling under control until he gets his compression. We will see him back as needed. Electronic Signature(s) Signed: 07/01/2019 11:11:25  AM By: Worthy Keeler PA-C Entered By: Worthy Keeler on 07/01/2019 11:11:25 Westenberger, Peyton Chauncey [name] ([id number]) -------------------------------------------------------------------------------- ROS/PFSH Details Patient Name: LANGILLE, Benjamin [name] S. Date of Service: 07/01/2019 10:15 AM Medical Record Number: [id number] Patient Account Number: [id number] Date of Birth/Sex: December 27, 1950 (68 y.o. M) Treating RN: Army Melia Primary Care Provider: Salome Holmes Other Clinician: Referring Provider: Salome Holmes Treating Provider/Extender: Melburn Hake, Kaysin Brock Weeks in Treatment: 9 Information Obtained From Patient Constitutional Symptoms (General Health) Complaints and Symptoms: Negative for: Fatigue; Fever; Chills; Marked Weight Change Respiratory Complaints and Symptoms: Negative for: Chronic or frequent coughs; Shortness of Breath Cardiovascular Complaints and Symptoms: Positive for: LE edema Negative for: Chest pain Psychiatric Complaints and Symptoms: Negative for: Anxiety; Claustrophobia Oncologic Medical History: Negative for: Received Chemotherapy; Received Radiation Immunizations Pneumococcal Vaccine: Received Pneumococcal Vaccination: Yes Tetanus Vaccine: Last tetanus shot: 04/22/2019 Implantable Devices None Hospitalization / Surgery History Type of Hospitalization/Surgery [facility] 04/07/2019 Family and Social History Cancer: Yes - Maternal Grandparents,Paternal Grandparents; Diabetes: No; Heart Disease: No; Hereditary Spherocytosis: No; Hypertension: No; Kidney Disease: No; Lung Disease: No; Seizures: No; Stroke: No; Thyroid Problems: No; Tuberculosis: No; Never smoker; Marital Status - Married; Alcohol Use: Never; Drug Use: No History; Caffeine Use: Daily - coffee; Financial Concerns: No; Food, Clothing or Shelter Needs: No;  Support System Lacking: No; Transportation Concerns: No Physician [place], [place]. ([id number]) I have reviewed and agree with the above information. Electronic Signature(s) Signed: 07/01/2019 11:56:24 AM By: Army Melia Signed: 07/01/2019 10:42:11 PM By: Worthy Keeler PA-C Entered By: Worthy Keeler on 07/01/2019 11:04:16 Casagrande, Huntley. ([id number]) -------------------------------------------------------------------------------- SuperBill Details Patient Name: Benjamin [name], Luan S. Date of Service: 07/01/2019 Medical Record Number: [id number] Patient Account Number: [id number] Date of Birth/Sex: 08-04-1951 (68 y.o. M) Treating RN: Army Melia Primary Care Provider: Salome Holmes Other Clinician: Referring Provider: Salome Holmes Treating Provider/Extender: Melburn Hake, Kye Silverstein Weeks in Treatment: 9 Diagnosis Coding ICD-10 Codes Code Description I87.2 Venous insufficiency (chronic) (peripheral) L97.822 Non-pressure chronic ulcer of other part of left lower leg with fat layer exposed I89.0 Lymphedema, not elsewhere classified Facility Procedures CPT4 Code: [id number] Description: [phone] - WOUND CARE VISIT-LEV 2 EST PT Modifier: Quantity: 1 Physician Procedures CPT4 Code Description: [id number] - WC PHYS LEVEL 4 - EST PT ICD-10 Diagnosis Description I87.2 Venous insufficiency (chronic) (peripheral) L97.822 Non-pressure chronic ulcer of other part of left lower leg wit I89.0 Lymphedema, not elsewhere  classified Modifier: h fat layer expos Quantity: 1 ed Electronic Signature(s) Signed: 07/01/2019 11:11:38 AM By: Worthy Keeler PA-C Entered By: Worthy Keeler on 07/01/2019 11:11:38

## 2019-07-01 NOTE — Progress Notes (Addendum)
MATTSON, DAYAL ([id number]) Visit Report for 07/01/2019 Arrival Information Details Patient Name: Benjamin [name], Benjamin [name]. Date of Service: 07/01/2019 10:15 AM Medical Record Number: [id number] Patient Account Number: [id number] Date of Birth/Sex: [birth year] (68 y.o. M) Treating RN: Montey Hora Primary Care Wolfe Camarena: Salome Holmes Other Clinician: Referring Darbi Chandran: Salome Holmes Treating Khai Arrona/Extender: Melburn Hake, HOYT Weeks in Treatment: 9 Visit Information History Since Last Visit Added or deleted any medications: No Patient Arrived: Ambulatory Any new allergies or adverse reactions: No Arrival Time: 10:22 Had a fall or experienced change in No Accompanied By: wife activities of daily living that may affect Transfer Assistance: None risk of falls: Patient Identification Verified: Yes Signs or symptoms of abuse/neglect since last visito No Secondary Verification Process Completed: Yes Hospitalized since last visit: No Implantable device outside of the clinic excluding No cellular tissue based products placed in the center since last visit: Has Dressing in Place as Prescribed: Yes Has Compression in Place as Prescribed: Yes Pain Present Now: No Electronic Signature(s) Signed: 07/01/2019 4:45:21 PM By: Montey Hora Entered By: Montey Hora on 07/01/2019 10:24:20 Lemieux, Thaxton Chauncey Cruel ([id number]) -------------------------------------------------------------------------------- Clinic Level of Care Assessment Details Patient Name: [name], [name] Date of Service: 07/01/2019 10:15 AM Medical Record Number: [id number] Patient Account Number: [id number] Date of Birth/Sex: [birth year] (68 y.o. M) Treating RN: Army Melia Primary Care Elin Seats: Salome Holmes Other Clinician: Referring Leylany Nored: Salome Holmes Treating Latoyia Tecson/Extender: Melburn Hake, HOYT Weeks in Treatment: 9 Clinic Level of Care Assessment Items TOOL 4 Quantity Score []  - Use when only an EandM is  performed on FOLLOW-UP visit 0 ASSESSMENTS - Nursing Assessment / Reassessment X - Reassessment of Co-morbidities (includes updates in patient status) 1 10 X- 1 5 Reassessment of Adherence to Treatment Plan ASSESSMENTS - Wound and Skin Assessment / Reassessment X - Simple Wound Assessment / Reassessment - one wound 1 5 []  - 0 Complex Wound Assessment / Reassessment - multiple wounds []  - 0 Dermatologic / Skin Assessment (not related to wound area) ASSESSMENTS - Focused Assessment []  - Circumferential Edema Measurements - multi extremities 0 []  - 0 Nutritional Assessment / Counseling / Intervention []  - 0 Lower Extremity Assessment (monofilament, tuning fork, pulses) []  - 0 Peripheral Arterial Disease Assessment (using hand held doppler) ASSESSMENTS - Ostomy and/or Continence Assessment and Care []  - Incontinence Assessment and Management 0 []  - 0 Ostomy Care Assessment and Management (repouching, etc.) PROCESS - Coordination of Care X - Simple Patient / Family Education for ongoing care 1 15 []  - 0 Complex (extensive) Patient / Family Education for ongoing care []  - 0 Staff obtains Programmer, systems, Records, Test Results / Process Orders []  - 0 Staff telephones HHA, Nursing Homes / Clarify orders / etc []  - 0 Routine Transfer to another Facility (non-emergent condition) []  - 0 Routine Hospital Admission (non-emergent condition) []  - 0 New Admissions / Biomedical engineer / Ordering NPWT, Apligraf, etc. []  - 0 Emergency Hospital Admission (emergent condition) X- 1 10 Simple Discharge Coordination Kuk, Johaan S. ([id number]) []  - 0 Complex (extensive) Discharge Coordination PROCESS - Special Needs []  - Pediatric / Minor Patient Management 0 []  - 0 Isolation Patient Management []  - 0 Hearing / Language / Visual special needs []  - 0 Assessment of Community assistance (transportation, D/C planning, etc.) []  - 0 Additional assistance / Altered mentation []  -  0 Support Surface(s) Assessment (bed, cushion, seat, etc.) INTERVENTIONS - Wound Cleansing / Measurement X - Simple Wound Cleansing - one wound 1 5 []  - 0 Complex Wound Cleansing -  multiple wounds X- 1 5 Wound Imaging (photographs - any number of wounds) []  - 0 Wound Tracing (instead of photographs) X- 1 5 Simple Wound Measurement - one wound []  - 0 Complex Wound Measurement - multiple wounds INTERVENTIONS - Wound Dressings []  - Small Wound Dressing one or multiple wounds 0 []  - 0 Medium Wound Dressing one or multiple wounds []  - 0 Large Wound Dressing one or multiple wounds []  - 0 Application of Medications - topical []  - 0 Application of Medications - injection INTERVENTIONS - Miscellaneous []  - External ear exam 0 []  - 0 Specimen Collection (cultures, biopsies, blood, body fluids, etc.) []  - 0 Specimen(s) / Culture(s) sent or taken to Lab for analysis []  - 0 Patient Transfer (multiple staff / Civil Service fast streamer / Similar devices) []  - 0 Simple Staple / Suture removal (25 or less) []  - 0 Complex Staple / Suture removal (26 or more) []  - 0 Hypo / Hyperglycemic Management (close monitor of Blood Glucose) []  - 0 Ankle / Brachial Index (ABI) - do not check if billed separately X- 1 5 Vital Signs Weich, Carzell S. ([id number]) Has the patient been seen at the hospital within the last three years: Yes Total Score: 65 Level Of Care: New/Established - Level 2 Electronic Signature(s) Signed: 07/01/2019 11:56:24 AM By: Army Melia Entered By: Army Melia on 07/01/2019 10:45:43 Chandonnet, Seiya S. ([id number]) -------------------------------------------------------------------------------- Encounter Discharge Information Details Patient Name: Benjamin Silos, [name] Date of Service: 07/01/2019 10:15 AM Medical Record Number: [id number] Patient Account Number: [id number] Date of Birth/Sex: 1951/01/19 (68 y.o. M) Treating RN: Army Melia Primary Care Gwendolyne Welford: Salome Holmes Other  Clinician: Referring Gita Dilger: Salome Holmes Treating Jae Skeet/Extender: Melburn Hake, HOYT Weeks in Treatment: 9 Encounter Discharge Information Items Discharge Condition: Stable Ambulatory Status: Ambulatory Discharge Destination: Home Transportation: Private Auto Accompanied By: wife Schedule Follow-up Appointment: Yes Clinical Summary of Care: Electronic Signature(s) Signed: 07/01/2019 11:56:24 AM By: Army Melia Entered By: Army Melia on 07/01/2019 10:46:27 Heidenreich, Zacharius S. ([id number]) -------------------------------------------------------------------------------- Lower Extremity Assessment Details Patient Name: Sauser, Joie S. Date of Service: 07/01/2019 10:15 AM Medical Record Number: [id number] Patient Account Number: [id number] Date of Birth/Sex: 07/23/1951 (68 y.o. M) Treating RN: Montey Hora Primary Care Kiet Geer: Salome Holmes Other Clinician: Referring Rockell Faulks: Salome Holmes Treating Nature Vogelsang/Extender: Melburn Hake, HOYT Weeks in Treatment: 9 Edema Assessment Assessed: [Left: No] [Right: No] Edema: [Left: Ye] [Right: s] Calf Left: Right: Point of Measurement: 33 cm From Medial Instep 44.8 cm cm Ankle Left: Right: Point of Measurement: 10 cm From Medial Instep 25.8 cm cm Vascular Assessment Pulses: Dorsalis Pedis Palpable: [Left:Yes] Electronic Signature(s) Signed: 07/01/2019 4:45:21 PM By: Montey Hora Entered By: Montey Hora on 07/01/2019 10:27:34 Shibata, Anna S. ([id number]) -------------------------------------------------------------------------------- Multi Wound Chart Details Patient Name: Sloniker, Cleavon S. Date of Service: 07/01/2019 10:15 AM Medical Record Number: [id number] Patient Account Number: [id number] Date of Birth/Sex: 22-Apr-1951 (68 y.o. M) Treating RN: Army Melia Primary Care Raynette Arras: Salome Holmes Other Clinician: Referring Corinna Burkman: Salome Holmes Treating Jaquetta Currier/Extender: Melburn Hake, HOYT Weeks in  Treatment: 9 Vital Signs Height(in): 68 Pulse(bpm): 81 Weight(lbs): 275 Blood Pressure(mmHg): 130/69 Body Mass Index(BMI): 42 Temperature(F): 98.5 Respiratory Rate 16 (breaths/min): Photos: [N/A:N/A] Wound Location: Left Lower Leg - Anterior N/A N/A Wounding Event: Trauma N/A N/A Primary Etiology: Trauma, Other N/A N/A Secondary Etiology: Venous Leg Ulcer N/A N/A Date Acquired: 03/11/2019 N/A N/A Weeks of Treatment: 9 N/A N/A Wound Status: Open N/A N/A Measurements L x W x D 0.1x0.1x0.1 N/A N/A (cm) Area (cm) : 0.008 N/A N/A  Volume (cm) : 0.001 N/A N/A % Reduction in Area: 99.80% N/A N/A % Reduction in Volume: 99.90% N/A N/A Classification: Partial Thickness N/A N/A Exudate Amount: None Present N/A N/A Wound Margin: Flat and Intact N/A N/A Granulation Amount: None Present (0%) N/A N/A Necrotic Amount: Large (67-100%) N/A N/A Necrotic Tissue: Eschar N/A N/A Exposed Structures: Fat Layer (Subcutaneous N/A N/A Tissue) Exposed: Yes Fascia: No Tendon: No Muscle: No Joint: No Bone: No Epithelialization: Large (67-100%) N/A N/A Treatment Notes ATTICUS, WEDIN ([id number]) Electronic Signature(s) Signed: 07/01/2019 11:56:24 AM By: Army Melia Entered By: Army Melia on 07/01/2019 10:43:30 Braun, Nat Chauncey Cruel ([id number]) -------------------------------------------------------------------------------- Multi-Disciplinary Care Plan Details Patient Name: Inda Castle. Date of Service: 07/01/2019 10:15 AM Medical Record Number: [id number] Patient Account Number: [id number] Date of Birth/Sex: Nov 29, 1950 (68 y.o. M) Treating RN: Army Melia Primary Care Shyler Hamill: Salome Holmes Other Clinician: Referring Trenton Passow: Salome Holmes Treating Akacia Boltz/Extender: Melburn Hake, HOYT Weeks in Treatment: 9 Active Inactive Electronic Signature(s) Signed: 07/01/2019 11:56:24 AM By: Army Melia Entered By: Army Melia on 07/01/2019 10:43:15 Styles, Nirvan S.  ([id number]) -------------------------------------------------------------------------------- Pain Assessment Details Patient Name: Fosdick, Icarus S. Date of Service: 07/01/2019 10:15 AM Medical Record Number: [id number] Patient Account Number: [id number] Date of Birth/Sex: 1951/07/18 (68 y.o. M) Treating RN: Montey Hora Primary Care Ezekiah Massie: Salome Holmes Other Clinician: Referring Emonee Winkowski: Salome Holmes Treating Jaylani Mcguinn/Extender: Melburn Hake, HOYT Weeks in Treatment: 9 Active Problems Location of Pain Severity and Description of Pain Patient Has Paino No Site Locations Pain Management and Medication Current Pain Management: Electronic Signature(s) Signed: 07/01/2019 4:45:21 PM By: Montey Hora Entered By: Montey Hora on 07/01/2019 10:24:59 Cisar, Lankford Chauncey Cruel ([id number]) -------------------------------------------------------------------------------- Patient/Caregiver Education Details Patient Name: Benjamin Silos, [name]. Date of Service: 07/01/2019 10:15 AM Medical Record Number: [id number] Patient Account Number: [id number] Date of Birth/Gender: 1951/03/06 (68 y.o. M) Treating RN: Army Melia Primary Care Physician: Salome Holmes Other Clinician: Referring Physician: Salome Holmes Treating Physician/Extender: Sharalyn Ink in Treatment: 9 Education Assessment Education Provided To: Patient Education Topics Provided Wound/Skin Impairment: Handouts: Caring for Your Ulcer Methods: Demonstration, Explain/Verbal Responses: State content correctly Electronic Signature(s) Signed: 07/01/2019 11:56:24 AM By: Army Melia Entered By: Army Melia on 07/01/2019 10:46:08 Dvorsky, Rameses S. ([id number]) -------------------------------------------------------------------------------- Wound Assessment Details Patient Name: Castro, Byran S. Date of Service: 07/01/2019 10:15 AM Medical Record Number: [id number] Patient Account Number: [id number] Date of  Birth/Sex: 1951-06-25 (68 y.o. M) Treating RN: Cornell Barman Primary Care Summit Borchardt: Salome Holmes Other Clinician: Referring Chevette Fee: Salome Holmes Treating Maddyx Vallie/Extender: Melburn Hake, HOYT Weeks in Treatment: 9 Wound Status Wound Number: 1 Primary Etiology: Trauma, Other Wound Location: Left, Anterior Lower Leg Secondary Etiology: Venous Leg Ulcer Wounding Event: Trauma Wound Status: Healed - Epithelialized Date Acquired: 03/11/2019 Weeks Of Treatment: 9 Clustered Wound: No Photos Wound Measurements Length: (cm) 0 Width: (cm) 0 Depth: (cm) 0 Area: (cm) 0.008 Volume: (cm) 0.001 % Reduction in Area: 99.8% % Reduction in Volume: 99.9% Epithelialization: Large (67-100%) Tunneling: No Undermining: No Wound Description Classification: Partial Thickness Foul Od Wound Margin: Flat and Intact Slough/ Exudate Amount: None Present or After Cleansing: No Fibrino Yes Wound Bed Granulation Amount: None Present (0%) Exposed Structure Necrotic Amount: Large (67-100%) Fascia Exposed: No Necrotic Quality: Eschar Fat Layer (Subcutaneous Tissue) Exposed: Yes Tendon Exposed: No Muscle Exposed: No Joint Exposed: No Bone Exposed: No Electronic Signature(s) Signed: 07/05/2019 5:10:16 PM By: Gretta Cool, BSN, RN, CWS, Kim RN, BSN Previous Signature: 07/01/2019 11:00:45 AM Version By: Darryll Capers, Taichi Chauncey Cruel ([id number]) Entered By: Gretta Cool, BSN, RN, CWS,  Kim on 07/02/2019 08:15:32 Homan, Rocky Chauncey Cruel ([id number]) -------------------------------------------------------------------------------- Vitals Details Patient Name: GEOGE, LAWRANCE. Date of Service: 07/01/2019 10:15 AM Medical Record Number: [id number] Patient Account Number: [id number] Date of Birth/Sex: 1951/08/28 (68 y.o. M) Treating RN: Montey Hora Primary Care Aaliah Jorgenson: Salome Holmes Other Clinician: Referring Zella Dewan: Salome Holmes Treating Irelyn Perfecto/Extender: Melburn Hake, HOYT Weeks in Treatment: 9 Vital  Signs Time Taken: 10:25 Temperature (F): 98.5 Height (in): 68 Pulse (bpm): 81 Weight (lbs): 275 Respiratory Rate (breaths/min): 16 Body Mass Index (BMI): 41.8 Blood Pressure (mmHg): 130/69 Reference Range: 80 - 120 mg / dl Electronic Signature(s) Signed: 07/01/2019 4:45:21 PM By: Montey Hora Entered By: Montey Hora on 07/01/2019 10:25:25

## 2019-09-25 DIAGNOSIS — M1711 Unilateral primary osteoarthritis, right knee: Secondary | ICD-10-CM | POA: Insufficient documentation

## 2019-09-25 DIAGNOSIS — M170 Bilateral primary osteoarthritis of knee: Secondary | ICD-10-CM | POA: Insufficient documentation

## 2019-10-05 DIAGNOSIS — E611 Iron deficiency: Secondary | ICD-10-CM | POA: Insufficient documentation

## 2020-01-06 NOTE — Discharge Instructions (Signed)
Instructions after Total Knee Replacement   Benjamin [name], Jr., M.D.     Dept. of Orthopaedics & Sports Medicine  [facility]  [address]  [place], [place]  [address]  Phone: [phone]   Fax: [phone]    DIET: Drink plenty of non-alcoholic fluids. Resume your normal diet. Include foods high in fiber.  ACTIVITY:  You may use crutches or a walker with weight-bearing as tolerated, unless instructed otherwise. You may be weaned off of the walker or crutches by your Physical Therapist.  Do NOT place pillows under the knee. Anything placed under the knee could limit your ability to straighten the knee.   Continue doing gentle exercises. Exercising will reduce the pain and swelling, increase motion, and prevent muscle weakness.   Please continue to use the TED compression stockings for 6 weeks. You may remove the stockings at night, but should reapply them in the morning. Do not drive or operate any equipment until instructed.  WOUND CARE:  Continue to use the PolarCare or ice packs periodically to reduce pain and swelling. You may bathe or shower after the staples are removed at the first office visit following surgery.  MEDICATIONS: You may resume your regular medications. Please take the pain medication as prescribed on the medication. Do not take pain medication on an empty stomach. You have been given a prescription for a blood thinner (Lovenox or Coumadin). Please take the medication as instructed. (NOTE: After completing a 2 week course of Lovenox, take one Enteric-coated aspirin once a day. This along with elevation will help reduce the possibility of phlebitis in your operated leg.) Do not drive or drink alcoholic beverages when taking pain medications.  CALL THE OFFICE FOR: Temperature above 101 degrees Excessive bleeding or drainage on the dressing. Excessive swelling, coldness, or paleness of the toes. Persistent nausea and vomiting.  FOLLOW-UP:  You  should have an appointment to return to the office in 10-14 days after surgery. Arrangements have been made for continuation of Physical Therapy (either home therapy or outpatient therapy).   [facility] Department Directory         [email or web address]       [email or web address]          Cardiology  Appointments: [place] - [phone] [place] - [phone]  Endocrinology  Appointments: [place] - [phone] [place] - [phone]  Gastroenterology  Appointments: [place] - [phone] [place] - [phone]        General Surgery   Appointments: [place] - [phone]  Internal Medicine/Family Medicine  Appointments: [place] - [phone] [place] - [phone] [place] - [phone]  Metabolic and Weigh Loss Surgery  Appointments: [place] - [phone]        Neurology  Appointments: [place] - [phone] [place] - [phone]  Neurosurgery  Appointments: [place] - [phone]  Obstetrics & Gynecology  Appointments: [place] - [phone] [place] - [phone]        Pediatrics  Appointments: [place] - [phone] [place] - [phone]  Physiatry  Appointments: [place] -[phone]  Physical Therapy  Appointments: [place] - [phone] [place] - [phone]        Podiatry  Appointments: [place] - [phone] [place] - [phone]  Pulmonology  Appointments: [place] - [phone]  Rheumatology  Appointments: [place] - [phone]        [place] Location: [facility]  [address] , [place]  [address]  [place] Location: [facility] [address] [place],   [address]  [place] Location: [facility] [address] [place],   [address]    

## 2020-01-21 ENCOUNTER — Encounter
Admission: RE | Admit: 2020-01-21 | Discharge: 2020-01-21 | Disposition: A | Discharge status: Home / Self Care | Payer: Medicare Other | Source: Ambulatory Visit | Attending: Orthopedic Surgery | Admitting: Orthopedic Surgery

## 2020-01-21 ENCOUNTER — Other Ambulatory Visit: Payer: Self-pay

## 2020-01-21 HISTORY — DX: Unspecified osteoarthritis, unspecified site: M19.90

## 2020-01-21 NOTE — Patient Instructions (Signed)
Your procedure is scheduled on: Wednesday January 26, 2020 Report to Day Surgery. To find out your arrival time please call [phone] between 1PM - 3PM on Tuesday January 25, 2020.  Remember: Instructions that are not followed completely may result in serious medical risk,  up to and including death, or upon the discretion of your surgeon and anesthesiologist your  surgery may need to be rescheduled.     _X__ 1. Do not eat food after midnight the night before your procedure.                 No gum chewing or hard candies. You may drink clear liquids up to 2 hours                 before you are scheduled to arrive for your surgery- DO not drink clear                 liquids within 2 hours of the start of your surgery.                 Clear Liquids include:  water, apple juice without pulp, clear Gatorade, G2 or                  Gatorade Zero (avoid Red/Purple/Blue), Black Coffee or Tea (Do not add                 anything to coffee or tea).  _____2.   Complete the carbohydrate drink provided to you, 2 hours before arrival.  __X__2.  On the morning of surgery brush your teeth with toothpaste and water, you                may rinse your mouth with mouthwash if you wish.  Do not swallow any toothpaste of mouthwash.     _X__ 3.  No Alcohol for 24 hours before or after surgery.   _X__ 4.  Do Not Smoke or use e-cigarettes For 24 Hours Prior to Your Surgery.                 Do not use any chewable tobacco products for at least 6 hours prior to                 surgery.  __x__  6.  Notify your doctor if there is any change in your medical condition      (cold, fever, infections).     Do not wear jewelry, make-up, hairpins, clips or nail polish. Do not wear lotions, powders, or perfumes. You may wear deodorant. Do not shave 48 hours prior to surgery. Men may shave face and neck. Do not bring valuables to the hospital.    [facility] is not responsible for any belongings  or valuables.  Contacts, dentures or bridgework may not be worn into surgery. Leave your suitcase in the car. After surgery it may be brought to your room. For patients admitted to the hospital, discharge time is determined by your treatment team.   Patients discharged the day of surgery will not be allowed to drive home.   Make arrangements for someone to be with you for the first 24 hours of your Same Day Discharge.   __x__ Take these medicines the morning of surgery with A SIP OF WATER:    1. buPROPion (WELLBUTRIN XL)   2. FLUoxetine (PROZAC)  3. pantoprazole (PROTONIX)   __x__ Use CHG Soap as directed  __x__ Stop Anti-inflammatories on 01/21/2020 such  as Ibuprofen, Aleve, naproxen, aspirin and or BC powders   __x__ Stop supplements until after surgery.    __x__ Do not start any herbal supplements before your surgery.

## 2020-01-24 ENCOUNTER — Encounter
Admission: RE | Admit: 2020-01-24 | Discharge: 2020-01-24 | Disposition: A | Discharge status: Home / Self Care | Payer: Medicare Other | Source: Ambulatory Visit | Attending: Orthopedic Surgery | Admitting: Orthopedic Surgery

## 2020-01-24 ENCOUNTER — Other Ambulatory Visit: Payer: Self-pay

## 2020-01-24 LAB — URINALYSIS, ROUTINE W REFLEX MICROSCOPIC
Bacteria, UA: NONE SEEN
Bilirubin Urine: NEGATIVE
Glucose, UA: NEGATIVE mg/dL
Hgb urine dipstick: NEGATIVE
Ketones, ur: NEGATIVE mg/dL
Nitrite: NEGATIVE
Protein, ur: NEGATIVE mg/dL
Specific Gravity, Urine: 1.027 (ref 1.005–1.030)
Squamous Epithelial / HPF: NONE SEEN (ref 0–5)
pH: 5 (ref 5.0–8.0)

## 2020-01-24 LAB — TYPE AND SCREEN
ABO/RH(D): AB POS
Antibody Screen: NEGATIVE

## 2020-01-24 LAB — COMPREHENSIVE METABOLIC PANEL
ALT: 20 U/L (ref 0–44)
AST: 20 U/L (ref 15–41)
Albumin: 3.7 g/dL (ref 3.5–5.0)
Alkaline Phosphatase: 69 U/L (ref 38–126)
Anion gap: 4 — ABNORMAL LOW (ref 5–15)
BUN: 17 mg/dL (ref 8–23)
CO2: 31 mmol/L (ref 22–32)
Calcium: 8.9 mg/dL (ref 8.9–10.3)
Chloride: 105 mmol/L (ref 98–111)
Creatinine, Ser: 1.19 mg/dL (ref 0.61–1.24)
GFR calc Af Amer: >60 mL/min (ref >60)
GFR calc non Af Amer: >60 mL/min (ref >60)
Glucose, Bld: 92 mg/dL (ref 70–99)
Potassium: 3.9 mmol/L (ref 3.5–5.1)
Sodium: 140 mmol/L (ref 135–145)
Total Bilirubin: 0.6 mg/dL (ref 0.3–1.2)
Total Protein: 6.2 g/dL — ABNORMAL LOW (ref 6.5–8.1)

## 2020-01-24 LAB — CBC
HCT: 45 % (ref 39.0–52.0)
Hemoglobin: 14.4 g/dL (ref 13.0–17.0)
MCH: 27.4 pg (ref 26.0–34.0)
MCHC: 32 g/dL (ref 30.0–36.0)
MCV: 85.6 fL (ref 80.0–100.0)
Platelets: 286 10*3/uL (ref 150–400)
RBC: 5.26 MIL/uL (ref 4.22–5.81)
RDW: 17.5 % — ABNORMAL HIGH (ref 11.5–15.5)
WBC: 8.2 10*3/uL (ref 4.0–10.5)
nRBC: 0 % (ref 0.0–0.2)

## 2020-01-24 LAB — APTT: aPTT: 28 seconds (ref 24–36)

## 2020-01-24 LAB — SURGICAL PCR SCREEN
MRSA, PCR: NEGATIVE
Staphylococcus aureus: POSITIVE — AB

## 2020-01-24 LAB — PROTIME-INR
INR: 1.1 (ref 0.8–1.2)
Prothrombin Time: 13.8 seconds (ref 11.4–15.2)

## 2020-01-24 LAB — C-REACTIVE PROTEIN: CRP: 1.2 mg/dL — ABNORMAL HIGH (ref <1.0)

## 2020-01-24 LAB — SARS CORONAVIRUS 2 (TAT 6-24 HRS): SARS Coronavirus 2: NEGATIVE

## 2020-01-24 LAB — SEDIMENTATION RATE: Sed Rate: 2 mm/hr (ref 0–20)

## 2020-01-25 LAB — URINE CULTURE
Culture: NO GROWTH
Special Requests: NORMAL

## 2020-01-26 ENCOUNTER — Inpatient Hospital Stay: Payer: Medicare Other

## 2020-01-26 ENCOUNTER — Encounter
Admission: RE | Disposition: A | Discharge status: Home / Self Care | Payer: Self-pay | Source: Home / Self Care | Attending: Orthopedic Surgery

## 2020-01-26 ENCOUNTER — Inpatient Hospital Stay
Admission: RE | Admit: 2020-01-26 | Discharge: 2020-01-28 | DRG: 470 | Disposition: A | Discharge status: Home Health | Payer: Medicare Other | Attending: Orthopedic Surgery | Admitting: Orthopedic Surgery

## 2020-01-26 ENCOUNTER — Encounter: Payer: Self-pay | Admitting: Orthopedic Surgery

## 2020-01-26 ENCOUNTER — Other Ambulatory Visit: Payer: Self-pay

## 2020-01-26 DIAGNOSIS — Z818 Family history of other mental and behavioral disorders: Secondary | ICD-10-CM | POA: Diagnosis not present

## 2020-01-26 DIAGNOSIS — Z20822 Contact with and (suspected) exposure to covid-19: Secondary | ICD-10-CM | POA: Diagnosis present

## 2020-01-26 DIAGNOSIS — Z888 Allergy status to other drugs, medicaments and biological substances status: Secondary | ICD-10-CM | POA: Diagnosis not present

## 2020-01-26 DIAGNOSIS — M25762 Osteophyte, left knee: Secondary | ICD-10-CM | POA: Diagnosis present

## 2020-01-26 DIAGNOSIS — Z87891 Personal history of nicotine dependence: Secondary | ICD-10-CM | POA: Diagnosis not present

## 2020-01-26 DIAGNOSIS — Z8249 Family history of ischemic heart disease and other diseases of the circulatory system: Secondary | ICD-10-CM | POA: Diagnosis not present

## 2020-01-26 DIAGNOSIS — F329 Major depressive disorder, single episode, unspecified: Secondary | ICD-10-CM | POA: Diagnosis present

## 2020-01-26 DIAGNOSIS — G4733 Obstructive sleep apnea (adult) (pediatric): Secondary | ICD-10-CM | POA: Diagnosis present

## 2020-01-26 DIAGNOSIS — Z6841 Body Mass Index (BMI) 40.0 and over, adult: Secondary | ICD-10-CM

## 2020-01-26 DIAGNOSIS — Z79899 Other long term (current) drug therapy: Secondary | ICD-10-CM

## 2020-01-26 DIAGNOSIS — M170 Bilateral primary osteoarthritis of knee: Principal | ICD-10-CM | POA: Diagnosis present

## 2020-01-26 DIAGNOSIS — G2581 Restless legs syndrome: Secondary | ICD-10-CM | POA: Diagnosis present

## 2020-01-26 DIAGNOSIS — Z79891 Long term (current) use of opiate analgesic: Secondary | ICD-10-CM | POA: Diagnosis not present

## 2020-01-26 DIAGNOSIS — Z791 Long term (current) use of non-steroidal anti-inflammatories (NSAID): Secondary | ICD-10-CM

## 2020-01-26 DIAGNOSIS — Z809 Family history of malignant neoplasm, unspecified: Secondary | ICD-10-CM

## 2020-01-26 DIAGNOSIS — Z981 Arthrodesis status: Secondary | ICD-10-CM

## 2020-01-26 DIAGNOSIS — M1712 Unilateral primary osteoarthritis, left knee: Secondary | ICD-10-CM | POA: Diagnosis present

## 2020-01-26 DIAGNOSIS — K219 Gastro-esophageal reflux disease without esophagitis: Secondary | ICD-10-CM | POA: Diagnosis present

## 2020-01-26 DIAGNOSIS — E782 Mixed hyperlipidemia: Secondary | ICD-10-CM | POA: Diagnosis present

## 2020-01-26 DIAGNOSIS — Z96659 Presence of unspecified artificial knee joint: Secondary | ICD-10-CM

## 2020-01-26 HISTORY — PX: KNEE ARTHROPLASTY: SHX992

## 2020-01-26 LAB — TYPE AND SCREEN
ABO/RH(D): AB POS
Antibody Screen: NEGATIVE

## 2020-01-26 SURGERY — ARTHROPLASTY, KNEE, TOTAL, USING IMAGELESS COMPUTER-ASSISTED NAVIGATION
Anesthesia: General | Site: Knee | Laterality: Left

## 2020-01-26 MED ORDER — DEXAMETHASONE SODIUM PHOSPHATE 10 MG/ML IJ SOLN: 8.0000 mg | Freq: Once | INTRAMUSCULAR | Status: AC

## 2020-01-26 MED ORDER — MIDAZOLAM HCL 2 MG/2ML IJ SOLN
INTRAMUSCULAR | Status: DC | PRN
  Administered 2020-01-26: 2 mg via INTRAVENOUS

## 2020-01-26 MED ORDER — ENSURE PRE-SURGERY PO LIQD
296.0000 mL | Freq: Once | ORAL | Status: AC
  Administered 2020-01-26: 08:00:00 296 mL via ORAL
  Filled 2020-01-26: qty 296

## 2020-01-26 MED ORDER — SODIUM CHLORIDE 0.9 % IV SOLN: INTRAVENOUS | Status: DC

## 2020-01-26 MED ORDER — DEXAMETHASONE SODIUM PHOSPHATE 10 MG/ML IJ SOLN
INTRAMUSCULAR | Status: AC
  Administered 2020-01-26: 8 mg via INTRAVENOUS
  Filled 2020-01-26: qty 1

## 2020-01-26 MED ORDER — HYDROMORPHONE HCL 1 MG/ML IJ SOLN
INTRAMUSCULAR | Status: AC
  Filled 2020-01-26: qty 1

## 2020-01-26 MED ORDER — CEFAZOLIN SODIUM-DEXTROSE 2-4 GM/100ML-% IV SOLN
2.0000 g | Freq: Four times a day (QID) | INTRAVENOUS | Status: AC
  Administered 2020-01-26 – 2020-01-27 (×4): 2 g via INTRAVENOUS
  Filled 2020-01-26 (×4): qty 100

## 2020-01-26 MED ORDER — FLUMAZENIL 0.5 MG/5ML IV SOLN
INTRAVENOUS | Status: AC
  Filled 2020-01-26: qty 5

## 2020-01-26 MED ORDER — SODIUM CHLORIDE FLUSH 0.9 % IV SOLN
INTRAVENOUS | Status: AC
  Filled 2020-01-26: qty 40

## 2020-01-26 MED ORDER — SODIUM CHLORIDE 0.9 % IV SOLN: INTRAVENOUS | Status: DC | PRN

## 2020-01-26 MED ORDER — FENTANYL CITRATE (PF) 250 MCG/5ML IJ SOLN
INTRAMUSCULAR | Status: AC
  Filled 2020-01-26: qty 5

## 2020-01-26 MED ORDER — PANTOPRAZOLE SODIUM 40 MG PO TBEC
40.0000 mg | DELAYED_RELEASE_TABLET | Freq: Two times a day (BID) | ORAL | Status: DC
  Administered 2020-01-26 – 2020-01-28 (×4): 40 mg via ORAL
  Filled 2020-01-26 (×4): qty 1

## 2020-01-26 MED ORDER — OXYMETAZOLINE HCL 0.05 % NA SOLN
1.0000 | Freq: Two times a day (BID) | NASAL | Status: DC | PRN
  Filled 2020-01-26: qty 15

## 2020-01-26 MED ORDER — MIDAZOLAM HCL 2 MG/2ML IJ SOLN
INTRAMUSCULAR | Status: AC
  Filled 2020-01-26: qty 2

## 2020-01-26 MED ORDER — PROPOFOL 500 MG/50ML IV EMUL
INTRAVENOUS | Status: AC
  Filled 2020-01-26: qty 50

## 2020-01-26 MED ORDER — CEFAZOLIN SODIUM-DEXTROSE 2-3 GM-%(50ML) IV SOLR
INTRAVENOUS | Status: DC | PRN
  Administered 2020-01-26: 2 g via INTRAVENOUS

## 2020-01-26 MED ORDER — OXYCODONE HCL 5 MG PO TABS
10.0000 mg | ORAL_TABLET | ORAL | Status: DC | PRN
  Administered 2020-01-26 – 2020-01-28 (×6): 10 mg via ORAL
  Filled 2020-01-26 (×6): qty 2

## 2020-01-26 MED ORDER — TRANEXAMIC ACID-NACL 1000-0.7 MG/100ML-% IV SOLN
1000.0000 mg | Freq: Once | INTRAVENOUS | Status: AC
  Administered 2020-01-26: 1000 mg via INTRAVENOUS

## 2020-01-26 MED ORDER — CELECOXIB 200 MG PO CAPS
ORAL_CAPSULE | ORAL | Status: AC
  Administered 2020-01-26: 400 mg via ORAL
  Filled 2020-01-26: qty 2

## 2020-01-26 MED ORDER — BISACODYL 10 MG RE SUPP: 10.0000 mg | Freq: Every day | RECTAL | Status: DC | PRN

## 2020-01-26 MED ORDER — PHENOL 1.4 % MT LIQD
1.0000 | OROMUCOSAL | Status: DC | PRN
  Filled 2020-01-26: qty 177

## 2020-01-26 MED ORDER — TRAZODONE HCL 50 MG PO TABS
50.0000 mg | ORAL_TABLET | Freq: Every evening | ORAL | Status: DC | PRN
  Administered 2020-01-26: 100 mg via ORAL
  Filled 2020-01-26: qty 2

## 2020-01-26 MED ORDER — METOCLOPRAMIDE HCL 10 MG PO TABS
10.0000 mg | ORAL_TABLET | Freq: Three times a day (TID) | ORAL | Status: DC
  Administered 2020-01-26 – 2020-01-28 (×6): 10 mg via ORAL
  Filled 2020-01-26 (×7): qty 1

## 2020-01-26 MED ORDER — CHLORHEXIDINE GLUCONATE 4 % EX LIQD
60.0000 mL | Freq: Once | CUTANEOUS | Status: AC
  Administered 2020-01-26: 4 via TOPICAL

## 2020-01-26 MED ORDER — NEOMYCIN-POLYMYXIN B GU 40-200000 IR SOLN
Status: DC | PRN
  Administered 2020-01-26: 14 mL

## 2020-01-26 MED ORDER — ONDANSETRON HCL 4 MG/2ML IJ SOLN: 4.0000 mg | Freq: Once | INTRAMUSCULAR | Status: DC | PRN

## 2020-01-26 MED ORDER — FLEET ENEMA 7-19 GM/118ML RE ENEM: 1.0000 | ENEMA | Freq: Once | RECTAL | Status: DC | PRN

## 2020-01-26 MED ORDER — SENNOSIDES-DOCUSATE SODIUM 8.6-50 MG PO TABS
1.0000 | ORAL_TABLET | Freq: Two times a day (BID) | ORAL | Status: DC
  Administered 2020-01-26 – 2020-01-28 (×4): 1 via ORAL
  Filled 2020-01-26 (×4): qty 1

## 2020-01-26 MED ORDER — BUPIVACAINE HCL (PF) 0.25 % IJ SOLN
INTRAMUSCULAR | Status: DC | PRN
  Administered 2020-01-26: 30 mL

## 2020-01-26 MED ORDER — TRANEXAMIC ACID-NACL 1000-0.7 MG/100ML-% IV SOLN
INTRAVENOUS | Status: AC
  Filled 2020-01-26: qty 100

## 2020-01-26 MED ORDER — PHENYLEPHRINE HCL (PRESSORS) 10 MG/ML IV SOLN
INTRAVENOUS | Status: DC | PRN
  Administered 2020-01-26 (×4): 100 ug via INTRAVENOUS

## 2020-01-26 MED ORDER — DIPHENHYDRAMINE HCL 12.5 MG/5ML PO ELIX: 12.5000 mg | ORAL_SOLUTION | ORAL | Status: DC | PRN

## 2020-01-26 MED ORDER — FLUOXETINE HCL 20 MG PO CAPS
20.0000 mg | ORAL_CAPSULE | ORAL | Status: DC
  Administered 2020-01-27 – 2020-01-28 (×2): 20 mg via ORAL
  Filled 2020-01-26 (×3): qty 1

## 2020-01-26 MED ORDER — ACETAMINOPHEN 10 MG/ML IV SOLN
INTRAVENOUS | Status: AC
  Filled 2020-01-26: qty 100

## 2020-01-26 MED ORDER — CEFAZOLIN SODIUM-DEXTROSE 2-4 GM/100ML-% IV SOLN: 2.0000 g | INTRAVENOUS | Status: DC

## 2020-01-26 MED ORDER — ONDANSETRON HCL 4 MG/2ML IJ SOLN
INTRAMUSCULAR | Status: DC | PRN
  Administered 2020-01-26: 4 mg via INTRAVENOUS

## 2020-01-26 MED ORDER — ACETAMINOPHEN 10 MG/ML IV SOLN
INTRAVENOUS | Status: DC | PRN
  Administered 2020-01-26: 1000 mg via INTRAVENOUS

## 2020-01-26 MED ORDER — FLUMAZENIL 0.5 MG/5ML IV SOLN
0.2000 mg | Freq: Once | INTRAVENOUS | Status: AC
  Administered 2020-01-26: 0.2 mg via INTRAVENOUS

## 2020-01-26 MED ORDER — LACTATED RINGERS IV SOLN: INTRAVENOUS | Status: DC

## 2020-01-26 MED ORDER — GABAPENTIN 300 MG PO CAPS
ORAL_CAPSULE | ORAL | Status: AC
  Administered 2020-01-26: 300 mg via ORAL
  Filled 2020-01-26: qty 1

## 2020-01-26 MED ORDER — CELECOXIB 200 MG PO CAPS
200.0000 mg | ORAL_CAPSULE | Freq: Two times a day (BID) | ORAL | Status: DC
  Administered 2020-01-26 – 2020-01-28 (×4): 200 mg via ORAL
  Filled 2020-01-26 (×4): qty 1

## 2020-01-26 MED ORDER — ACETAMINOPHEN 325 MG PO TABS: 325.0000 mg | ORAL_TABLET | Freq: Four times a day (QID) | ORAL | Status: DC | PRN

## 2020-01-26 MED ORDER — ALUM & MAG HYDROXIDE-SIMETH 200-200-20 MG/5ML PO SUSP: 30.0000 mL | ORAL | Status: DC | PRN

## 2020-01-26 MED ORDER — ONDANSETRON HCL 4 MG/2ML IJ SOLN: 4.0000 mg | Freq: Four times a day (QID) | INTRAMUSCULAR | Status: DC | PRN

## 2020-01-26 MED ORDER — BUPIVACAINE LIPOSOME 1.3 % IJ SUSP
INTRAMUSCULAR | Status: AC
  Filled 2020-01-26: qty 20

## 2020-01-26 MED ORDER — FENTANYL CITRATE (PF) 100 MCG/2ML IJ SOLN
INTRAMUSCULAR | Status: DC | PRN
  Administered 2020-01-26 (×2): 50 ug via INTRAVENOUS
  Administered 2020-01-26: 100 ug via INTRAVENOUS
  Administered 2020-01-26: 50 ug via INTRAVENOUS

## 2020-01-26 MED ORDER — FENTANYL CITRATE (PF) 100 MCG/2ML IJ SOLN: 25.0000 ug | INTRAMUSCULAR | Status: DC | PRN

## 2020-01-26 MED ORDER — CELECOXIB 200 MG PO CAPS: 400.0000 mg | ORAL_CAPSULE | Freq: Once | ORAL | Status: AC

## 2020-01-26 MED ORDER — LIDOCAINE HCL (CARDIAC) PF 100 MG/5ML IV SOSY
PREFILLED_SYRINGE | INTRAVENOUS | Status: DC | PRN
  Administered 2020-01-26: 100 mg via INTRAVENOUS

## 2020-01-26 MED ORDER — ROPINIROLE HCL 1 MG PO TABS
5.0000 mg | ORAL_TABLET | Freq: Three times a day (TID) | ORAL | Status: DC
  Administered 2020-01-27 – 2020-01-28 (×3): 5 mg via ORAL
  Filled 2020-01-26 (×4): qty 5

## 2020-01-26 MED ORDER — HYDROMORPHONE HCL 1 MG/ML IJ SOLN
INTRAMUSCULAR | Status: DC | PRN
  Administered 2020-01-26: 1 mg via INTRAVENOUS

## 2020-01-26 MED ORDER — OXYCODONE HCL 5 MG PO TABS: 5.0000 mg | ORAL_TABLET | Freq: Every day | ORAL | Status: DC

## 2020-01-26 MED ORDER — ACETAMINOPHEN 10 MG/ML IV SOLN
1000.0000 mg | Freq: Four times a day (QID) | INTRAVENOUS | Status: AC
  Administered 2020-01-26 – 2020-01-27 (×4): 1000 mg via INTRAVENOUS
  Filled 2020-01-26 (×5): qty 100

## 2020-01-26 MED ORDER — HYDROMORPHONE HCL 1 MG/ML IJ SOLN: 0.5000 mg | INTRAMUSCULAR | Status: DC | PRN

## 2020-01-26 MED ORDER — ENOXAPARIN SODIUM 30 MG/0.3ML ~~LOC~~ SOLN
30.0000 mg | Freq: Two times a day (BID) | SUBCUTANEOUS | Status: DC
  Administered 2020-01-27 – 2020-01-28 (×3): 30 mg via SUBCUTANEOUS
  Filled 2020-01-26 (×3): qty 0.3

## 2020-01-26 MED ORDER — METOCLOPRAMIDE HCL 10 MG PO TABS: 5.0000 mg | ORAL_TABLET | Freq: Three times a day (TID) | ORAL | Status: DC | PRN

## 2020-01-26 MED ORDER — BUPIVACAINE HCL (PF) 0.25 % IJ SOLN
INTRAMUSCULAR | Status: AC
  Filled 2020-01-26: qty 60

## 2020-01-26 MED ORDER — DEXAMETHASONE SODIUM PHOSPHATE 10 MG/ML IJ SOLN
INTRAMUSCULAR | Status: DC | PRN
  Administered 2020-01-26: 5 mg via INTRAVENOUS

## 2020-01-26 MED ORDER — MAGNESIUM HYDROXIDE 400 MG/5ML PO SUSP
30.0000 mL | Freq: Every day | ORAL | Status: DC
  Administered 2020-01-27: 30 mL via ORAL
  Filled 2020-01-26: qty 30

## 2020-01-26 MED ORDER — ACETAMINOPHEN 10 MG/ML IV SOLN: 1000.0000 mg | Freq: Once | INTRAVENOUS | Status: DC | PRN

## 2020-01-26 MED ORDER — OXYCODONE HCL 5 MG PO TABS: 5.0000 mg | ORAL_TABLET | ORAL | Status: DC | PRN

## 2020-01-26 MED ORDER — TRANEXAMIC ACID-NACL 1000-0.7 MG/100ML-% IV SOLN: 1000.0000 mg | INTRAVENOUS | Status: DC

## 2020-01-26 MED ORDER — OXYCODONE HCL 5 MG PO TABS: 5.0000 mg | ORAL_TABLET | Freq: Once | ORAL | Status: DC | PRN

## 2020-01-26 MED ORDER — CEFAZOLIN SODIUM-DEXTROSE 2-4 GM/100ML-% IV SOLN
INTRAVENOUS | Status: AC
  Filled 2020-01-26: qty 100

## 2020-01-26 MED ORDER — OXYCODONE HCL 5 MG/5ML PO SOLN: 5.0000 mg | Freq: Once | ORAL | Status: DC | PRN

## 2020-01-26 MED ORDER — MENTHOL 3 MG MT LOZG
1.0000 | LOZENGE | OROMUCOSAL | Status: DC | PRN
  Filled 2020-01-26: qty 9

## 2020-01-26 MED ORDER — ONDANSETRON HCL 4 MG PO TABS: 4.0000 mg | ORAL_TABLET | Freq: Four times a day (QID) | ORAL | Status: DC | PRN

## 2020-01-26 MED ORDER — GABAPENTIN 300 MG PO CAPS: 300.0000 mg | ORAL_CAPSULE | Freq: Once | ORAL | Status: AC

## 2020-01-26 MED ORDER — TRAMADOL HCL 50 MG PO TABS
50.0000 mg | ORAL_TABLET | ORAL | Status: DC | PRN
  Filled 2020-01-26: qty 2

## 2020-01-26 MED ORDER — BUPROPION HCL ER (XL) 150 MG PO TB24
450.0000 mg | ORAL_TABLET | Freq: Every day | ORAL | Status: DC
  Administered 2020-01-27 – 2020-01-28 (×2): 450 mg via ORAL
  Filled 2020-01-26 (×2): qty 3

## 2020-01-26 MED ORDER — GABAPENTIN 300 MG PO CAPS
300.0000 mg | ORAL_CAPSULE | Freq: Every day | ORAL | Status: DC
  Administered 2020-01-26 – 2020-01-27 (×2): 300 mg via ORAL
  Filled 2020-01-26 (×2): qty 1

## 2020-01-26 MED ORDER — SUCCINYLCHOLINE CHLORIDE 20 MG/ML IJ SOLN
INTRAMUSCULAR | Status: DC | PRN
  Administered 2020-01-26: 140 mg via INTRAVENOUS

## 2020-01-26 MED ORDER — NEOMYCIN-POLYMYXIN B GU 40-200000 IR SOLN
Status: AC
  Filled 2020-01-26: qty 20

## 2020-01-26 MED ORDER — FERROUS SULFATE 325 (65 FE) MG PO TABS
325.0000 mg | ORAL_TABLET | Freq: Two times a day (BID) | ORAL | Status: DC
  Administered 2020-01-27 – 2020-01-28 (×3): 325 mg via ORAL
  Filled 2020-01-26 (×3): qty 1

## 2020-01-26 MED ORDER — METOCLOPRAMIDE HCL 5 MG/ML IJ SOLN: 5.0000 mg | Freq: Three times a day (TID) | INTRAMUSCULAR | Status: DC | PRN

## 2020-01-26 MED ORDER — PROPOFOL 10 MG/ML IV BOLUS
INTRAVENOUS | Status: DC | PRN
  Administered 2020-01-26: 30 mg via INTRAVENOUS
  Administered 2020-01-26: 50 mg via INTRAVENOUS
  Administered 2020-01-26: 180 mg via INTRAVENOUS

## 2020-01-26 SURGICAL SUPPLY — 79 items
ATTUNE MED DOME PAT 41 KNEE (Knees) ×1 IMPLANT
ATTUNE MED DOME PAT 41MM KNEE (Knees) ×1 IMPLANT
ATTUNE PS FEM LT SZ 7 CEM KNEE (Femur) ×2 IMPLANT
ATTUNE PSRP INSR SZ7 5 KNEE (Insert) ×1 IMPLANT
ATTUNE PSRP INSR SZ7 5MM KNEE (Insert) ×1 IMPLANT
BASE TIBIAL ROT PLAT SZ 7 KNEE (Knees) IMPLANT
BATTERY INSTRU NAVIGATION (MISCELLANEOUS) ×12 IMPLANT
BLADE SAW 70X12.5 (BLADE) ×3 IMPLANT
BLADE SAW 90X13X1.19 OSCILLAT (BLADE) ×3 IMPLANT
BLADE SAW 90X25X1.19 OSCILLAT (BLADE) ×3 IMPLANT
BONE CEMENT GENTAMICIN (Cement) ×6 IMPLANT
CANISTER SUCT 3000ML PPV (MISCELLANEOUS) ×3 IMPLANT
CEMENT BONE GENTAMICIN 40 (Cement) IMPLANT
COOLER POLAR GLACIER W/PUMP (MISCELLANEOUS) ×3 IMPLANT
COVER WAND RF STERILE (DRAPES) ×3 IMPLANT
CUFF TOURN SGL QUICK 24 (TOURNIQUET CUFF)
CUFF TOURN SGL QUICK 30 (TOURNIQUET CUFF) ×2
CUFF TRNQT CYL 24X4X16.5-23 (TOURNIQUET CUFF) IMPLANT
CUFF TRNQT CYL 30X4X21-28X (TOURNIQUET CUFF) IMPLANT
DRAPE 3/4 80X56 (DRAPES) ×3 IMPLANT
DRSG DERMACEA 8X12 NADH (GAUZE/BANDAGES/DRESSINGS) ×3 IMPLANT
DRSG OPSITE POSTOP 4X14 (GAUZE/BANDAGES/DRESSINGS) ×3 IMPLANT
DRSG TEGADERM 4X4.75 (GAUZE/BANDAGES/DRESSINGS) ×3 IMPLANT
DURAPREP 26ML APPLICATOR (WOUND CARE) ×6 IMPLANT
ELECT REM PT RETURN 9FT ADLT (ELECTROSURGICAL) ×3
ELECTRODE REM PT RTRN 9FT ADLT (ELECTROSURGICAL) ×1 IMPLANT
EX-PIN ORTHOLOCK NAV 4X150 (PIN) ×6 IMPLANT
GLOVE BIO SURGEON STRL SZ7.5 (GLOVE) ×6 IMPLANT
GLOVE BIOGEL M STRL SZ7.5 (GLOVE) ×6 IMPLANT
GLOVE BIOGEL PI IND STRL 7.5 (GLOVE) ×1 IMPLANT
GLOVE BIOGEL PI INDICATOR 7.5 (GLOVE) ×2
GLOVE INDICATOR 8.0 STRL GRN (GLOVE) ×3 IMPLANT
GOWN STRL REUS W/ TWL LRG LVL3 (GOWN DISPOSABLE) ×2 IMPLANT
GOWN STRL REUS W/ TWL XL LVL3 (GOWN DISPOSABLE) ×1 IMPLANT
GOWN STRL REUS W/TWL LRG LVL3 (GOWN DISPOSABLE) ×4
GOWN STRL REUS W/TWL XL LVL3 (GOWN DISPOSABLE) ×2
HEMOVAC 400CC 10FR (MISCELLANEOUS) ×3 IMPLANT
HOLDER FOLEY CATH W/STRAP (MISCELLANEOUS) ×3 IMPLANT
HOOD PEEL AWAY FLYTE STAYCOOL (MISCELLANEOUS) ×6 IMPLANT
KIT TURNOVER KIT A (KITS) ×3 IMPLANT
KNIFE SCULPS 14X20 (INSTRUMENTS) ×3 IMPLANT
LABEL OR SOLS (LABEL) ×3 IMPLANT
MANIFOLD NEPTUNE II (INSTRUMENTS) ×3 IMPLANT
NDL SAFETY ECLIPSE 18X1.5 (NEEDLE) ×1 IMPLANT
NDL SPNL 20GX3.5 QUINCKE YW (NEEDLE) ×2 IMPLANT
NEEDLE HYPO 18GX1.5 SHARP (NEEDLE) ×2
NEEDLE SPNL 20GX3.5 QUINCKE YW (NEEDLE) ×6 IMPLANT
NS IRRIG 500ML POUR BTL (IV SOLUTION) ×3 IMPLANT
PACK TOTAL KNEE (MISCELLANEOUS) ×3 IMPLANT
PAD WRAPON POLAR KNEE (MISCELLANEOUS) ×1 IMPLANT
PAD WRAPON POLOR MULTI XL (MISCELLANEOUS) IMPLANT
PENCIL SMOKE EVACUATOR COATED (MISCELLANEOUS) ×3 IMPLANT
PENCIL SMOKE ULTRAEVAC 22 CON (MISCELLANEOUS) ×3 IMPLANT
PIN DRILL QUICK PACK ×3 IMPLANT
PIN FIXATION 1/8DIA X 3INL (PIN) ×9 IMPLANT
PULSAVAC PLUS IRRIG FAN TIP (DISPOSABLE) ×3
SOL .9 NS 3000ML IRR  AL (IV SOLUTION) ×2
SOL .9 NS 3000ML IRR UROMATIC (IV SOLUTION) ×1 IMPLANT
SOL PREP PVP 2OZ (MISCELLANEOUS) ×3
SOLUTION PREP PVP 2OZ (MISCELLANEOUS) ×1 IMPLANT
SPONGE DRAIN TRACH 4X4 STRL 2S (GAUZE/BANDAGES/DRESSINGS) ×3 IMPLANT
STAPLER SKIN PROX 35W (STAPLE) ×3 IMPLANT
STOCKINETTE IMPERV 14X48 (MISCELLANEOUS) ×2 IMPLANT
STRAP TIBIA SHORT (MISCELLANEOUS) ×3 IMPLANT
SUCTION FRAZIER HANDLE 10FR (MISCELLANEOUS) ×2
SUCTION TUBE FRAZIER 10FR DISP (MISCELLANEOUS) ×1 IMPLANT
SUT VIC AB 0 CT1 36 (SUTURE) ×6 IMPLANT
SUT VIC AB 1 CT1 36 (SUTURE) ×6 IMPLANT
SUT VIC AB 2-0 CT2 27 (SUTURE) ×3 IMPLANT
SYR 20ML LL LF (SYRINGE) ×3 IMPLANT
SYR 30ML LL (SYRINGE) ×6 IMPLANT
TIBIAL BASE ROT PLAT SZ 7 KNEE (Knees) ×3 IMPLANT
TIP FAN IRRIG PULSAVAC PLUS (DISPOSABLE) ×1 IMPLANT
TOWEL OR 17X26 4PK STRL BLUE (TOWEL DISPOSABLE) ×3 IMPLANT
TOWER CARTRIDGE SMART MIX (DISPOSABLE) ×3 IMPLANT
TRAY FOLEY MTR SLVR 16FR STAT (SET/KITS/TRAYS/PACK) ×3 IMPLANT
WRAP-ON POLOR PAD MULTI XL (MISCELLANEOUS) ×1
WRAPON POLAR PAD KNEE (MISCELLANEOUS)
WRAPON POLOR PAD MULTI XL (MISCELLANEOUS) ×2

## 2020-01-26 NOTE — Op Note (Signed)
OPERATIVE NOTE  DATE OF SURGERY:  01/26/2020  PATIENT NAME:  Benjamin [name]   DOB: [birth year]  MRN: [id number]  PRE-OPERATIVE DIAGNOSIS: Degenerative arthrosis of the left knee, primary  POST-OPERATIVE DIAGNOSIS:  Same  PROCEDURE:  Left total knee arthroplasty using computer-assisted navigation  SURGEON:  Marciano Sequin. M.D.  ASSISTANT: Cassell Smiles, PA-C (present and scrubbed throughout the case, critical for assistance with exposure, retraction, instrumentation, and closure)  ANESTHESIA: general  ESTIMATED BLOOD LOSS: 50 mL  FLUIDS REPLACED: 1300 mL of crystalloid  TOURNIQUET TIME: 117 minutes  DRAINS: 2 medium Hemovac drains  SOFT TISSUE RELEASES: Anterior cruciate ligament, posterior cruciate ligament, deep medial collateral ligament, patellofemoral ligament, and posterolateral corner  IMPLANTS UTILIZED: DePuy Attune size 7 posterior stabilized femoral component (cemented), size 7 rotating platform tibial component (cemented), 41 mm medialized dome patella (cemented), and a 5 mm stabilized rotating platform polyethylene insert.  INDICATIONS FOR SURGERY: Benjamin [name] is a 69 y.o. year old male with a long history of progressive knee pain. X-rays demonstrated severe degenerative changes in tricompartmental fashion. The patient had not seen any significant improvement despite conservative nonsurgical intervention. After discussion of the risks and benefits of surgical intervention, the patient expressed understanding of the risks benefits and agree with plans for total knee arthroplasty.   The risks, benefits, and alternatives were discussed at length including but not limited to the risks of infection, bleeding, nerve injury, stiffness, blood clots, the need for revision surgery, cardiopulmonary complications, among others, and they were willing to proceed.  PROCEDURE IN DETAIL: The patient was brought into the operating room and, after adequate general anesthesia was  achieved, a tourniquet was placed on the patient's upper thigh. The patient's knee and leg were cleaned and prepped with alcohol and DuraPrep and draped in the usual sterile fashion. A "timeout" was performed as per usual protocol. The lower extremity was exsanguinated using an Esmarch, and the tourniquet was inflated to 300 mmHg. An anterior longitudinal incision was made followed by a standard mid vastus approach. The deep fibers of the medial collateral ligament were elevated in a subperiosteal fashion off of the medial flare of the tibia so as to maintain a continuous soft tissue sleeve. The patella was subluxed laterally and the patellofemoral ligament was incised. Inspection of the knee demonstrated severe degenerative changes with full-thickness loss of articular cartilage. Osteophytes were debrided using a rongeur. Anterior and posterior cruciate ligaments were excised. Two 4.0 mm Schanz pins were inserted in the femur and into the tibia for attachment of the array of trackers used for computer-assisted navigation. Hip center was identified using a circumduction technique. Distal landmarks were mapped using the computer. The distal femur and proximal tibia were mapped using the computer. The distal femoral cutting guide was positioned using computer-assisted navigation so as to achieve a 5 distal valgus cut. The femur was sized and it was felt that a size 7 femoral component was appropriate. A size 7 femoral cutting guide was positioned and the anterior cut was performed and verified using the computer. This was followed by completion of the posterior and chamfer cuts. Femoral cutting guide for the central box was then positioned in the center box cut was performed.  Attention was then directed to the proximal tibia. Medial and lateral menisci were excised. The extramedullary tibial cutting guide was positioned using computer-assisted navigation so as to achieve a 0 varus-valgus alignment and 3  posterior slope. The cut was performed and verified using the computer. The  proximal tibia was sized and it was felt that a size 7 tibial tray was appropriate. Tibial and femoral trials were inserted followed by insertion of a 5 mm polyethylene insert. The knee was felt to be tight laterally.  The trial components were removed and the knee was brought into full extension and distracted using the Moreland retractors.  The posterolateral corner was carefully released using a combination of electrocautery and Metzenbaum scissors.  Trial components were reinserted followed by placement of a 5 mm polyethylene trial.  This allowed for excellent mediolateral soft tissue balancing both in flexion and in full extension. Finally, the patella was cut and prepared so as to accommodate a 41 mm medialized dome patella. A patella trial was placed and the knee was placed through a range of motion with excellent patellar tracking appreciated. The femoral trial was removed after debridement of posterior osteophytes. The central post-hole for the tibial component was reamed followed by insertion of a keel punch. Tibial trials were then removed. Cut surfaces of bone were irrigated with copious amounts of normal saline with antibiotic solution using pulsatile lavage and then suctioned dry. Polymethylmethacrylate cement with gentamicin was prepared in the usual fashion using a vacuum mixer. Cement was applied to the cut surface of the proximal tibia as well as along the undersurface of a size 7 rotating platform tibial component. Tibial component was positioned and impacted into place. Excess cement was removed using [employer] fast streamer. Cement was then applied to the cut surfaces of the femur as well as along the posterior flanges of the size 7 femoral component. The femoral component was positioned and impacted into place. Excess cement was removed using [employer] fast streamer. A 5 mm polyethylene trial was inserted and the knee was brought into  full extension with steady axial compression applied. Finally, cement was applied to the backside of a 41 mm medialized dome patella and the patellar component was positioned and patellar clamp applied. Excess cement was removed using [employer] fast streamer. After adequate curing of the cement, the tourniquet was deflated after a total tourniquet time of 117 minutes. Hemostasis was achieved using electrocautery. The knee was irrigated with copious amounts of normal saline with antibiotic solution using pulsatile lavage and then suctioned dry. 20 mL of 1.3% Exparel and 60 mL of 0.25% Marcaine in 40 mL of normal saline was injected along the posterior capsule, medial and lateral gutters, and along the arthrotomy site. A 5 mm stabilized rotating platform polyethylene insert was inserted and the knee was placed through a range of motion with excellent mediolateral soft tissue balancing appreciated and excellent patellar tracking noted. 2 medium drains were placed in the wound bed and brought out through separate stab incisions. The medial parapatellar portion of the incision was reapproximated using interrupted sutures of #1 Vicryl. Subcutaneous tissue was approximated in layers using first #0 Vicryl followed #2-0 Vicryl. The skin was approximated with skin staples. A sterile dressing was applied.  The patient tolerated the procedure well and was transported to the recovery room in stable condition.    James P. Holley Bouche., M.D.

## 2020-01-26 NOTE — Anesthesia Preprocedure Evaluation (Addendum)
Anesthesia Evaluation  Patient identified by MRN, date of birth, ID band Patient awake    Reviewed: Allergy & Precautions, NPO status , Patient's Chart, lab work & pertinent test results  History of Anesthesia Complications Negative for: history of anesthetic complications  Airway Mallampati: II  TM Distance: >3 FB Neck ROM: Full    Dental no notable dental hx. (+) Teeth Intact, Dental Advisory Given   Pulmonary sleep apnea and Continuous Positive Airway Pressure Ventilation , neg COPD, Patient abstained from smoking.Not current smoker,    Pulmonary exam normal breath sounds clear to auscultation       Cardiovascular Exercise Tolerance: Good METS(-) hypertension(-) CAD and (-) Past MI negative cardio ROS  (-) dysrhythmias  Rhythm:Regular Rate:Normal - Systolic murmurs    Neuro/Psych PSYCHIATRIC DISORDERS Depression negative neurological ROS     GI/Hepatic GERD  Controlled,(+)     (-) substance abuse  ,   Endo/Other  neg diabetesMorbid obesity  Renal/GU negative Renal ROS     Musculoskeletal  (+) Arthritis , Osteoarthritis,  S/p lumbar fusions   Abdominal   Peds  Hematology   Anesthesia Other Findings Past Medical History: No date: Arthritis No date: Depression No date: GERD (gastroesophageal reflux disease) No date: OSA (obstructive sleep apnea)     Comment:  uses C-pap machine   Reproductive/Obstetrics                             Anesthesia Physical Anesthesia Plan  ASA: III  Anesthesia Plan: General and General/Spinal   Post-op Pain Management:    Induction: Intravenous  PONV Risk Score and Plan: 3 and Ondansetron, Dexamethasone and Midazolam  Airway Management Planned: Oral ETT  Additional Equipment: None  Intra-op Plan:   Post-operative Plan: Extubation in OR  Informed Consent: I have reviewed the patients History and Physical, chart, labs and discussed the  procedure including the risks, benefits and alternatives for the proposed anesthesia with the patient or authorized representative who has indicated his/her understanding and acceptance.     Dental advisory given  Plan Discussed with: CRNA and Surgeon  Anesthesia Plan Comments: (Discussed risks of anesthesia with patient, including PONV, sore throat, lip/dental damage. Rare risks discussed as well, such as cardiorespiratory and neurological sequelae. Patient understands. Patient informed about increased incidence of above perioperative risk due to high BMI. Patient understands.  Patient refused spinal anesthetic, said he had a bad experience during a prior knee procedure where they attempted a spinal and were unable to succeed with it, and the patient had what sounds to be a vagal reaction ("immediately nauseous, lightheaded"). Discussed R/B/A of spinal vs general, and patient understands.  Update 1206: I was informed that patient has now agreed, after speaking with surgeon, to allow one attempt at a spinal, with GETA as backup.)      Anesthesia Quick Evaluation

## 2020-01-26 NOTE — H&P (Signed)
ORTHOPAEDIC HISTORY & PHYSICAL  Progress Notes by Gwenlyn Fudge, PA at 01/18/2020 3:30 PM  [facility] Chief Complaint:       Chief Complaint  Patient presents with  . Knee Pain    H&P Left TKA 01/26/20    History of Present Illness:    Benjamin [name] is a 69 y.o. male that presents to clinic today for his preoperative history and evaluation.  Patient presents with his wife. The patient is scheduled to undergo a left total knee arthroplasty on 01/26/20 by Dr. Marry Guan. His pain began several years ago.  The pain is located primarily along the lateral aspect of the knee.  He describes his pain as worse with weight bearing and using stairs.  He reports associated swelling and some giving way of the knee.  He denies associated numbness or tingling, denies locking of the knee.   The patient's symptoms have progressed to the point that they decrease his quality of life. The patient has previously undergone conservative treatment including NSAIDS and injections to the knee without adequate control of his symptoms.  Patient denies any history of blood clots. Of note, patient reports a history of severe restless leg syndrome. He states that he had an episode of A-fib that was brought on by alcohol use trying to cope with the restless legs. He states he has not seen cardiology recently. He also began taking oxycodone as needed at night for his restless leg syndrome.   Patient states that he was informed he needed to try to lose weight and has lost 10-12 lbs. He states he was not told he needed to get to 262 lbs.   Past Medical, Surgical, Family, Social History, Allergies, Medications:   Past Medical History:      Past Medical History:  Diagnosis Date  . Anesthesia complication    Unexplainable crying  . Anxiety 11/2014  . Arthritis   . Atrial fibrillation (CMS-HCC)   . Bleeding ulcer 04/07/2018  . Depression   . GERD  (gastroesophageal reflux disease)   . Hepatitis    Was sick from "hepatitis" as kid  . History of cancer 2019   skin  . History of motion sickness   . Mixed hyperlipidemia   . Obesity   . PVC's (premature ventricular contractions)   . Restless leg syndrome   . Sleep apnea   . Tremor 2008    Past Surgical History:       Past Surgical History:  Procedure Laterality Date  . AUTOGRAFT STRUCTURAL ICBG OBTAINED SEPARATE INCISION FOR SPINE SURGERY N/A 01/31/2017   Procedure: AUTOGRAFT FOR SPINE SURGERY ONLY (INCL HARVESTING GRAFT); STRUCTURAL, BICORTICAL OR TRICORTICAL (THROUGH SEPARATE SKIN OR FASCIAL INCISION) (LIST IN ADDITION TO PRIMARY PROCEDURE);  Surgeon: Loni Dolly, MD;  Location: [facility];  Service: Orthopedics;  Laterality: N/A;  . COLONOSCOPY    . EXPLORATION SPINAL FUSION Bilateral 01/20/2018   Procedure: EXPLORATION OF SPINAL FUSION;  Surgeon: Loni Dolly, MD;  Location: [facility];  Service: Orthopedics;  Laterality: Bilateral;  . INSTRUMENTATION POSTERIOR SPINE 3 TO 6 VERTEBRAL SEGMENTS Bilateral 01/20/2018   Procedure: POSTERIOR SEGMENTAL INSTRUMENTATION (EG, PEDICLE FIXATION, DUAL RODS WITH MULTIPLE HOOKS AND SUBLAMINAR WIRES); 3 TO 6 VERTEBRAL SEGMENTS;  Surgeon: Loni Dolly, MD;  Location: [facility];  Service: Orthopedics;  Laterality: Bilateral;  . knee arthoscopy Right   . LAMINECTOMY POSTERIOR CERVICLE DECOMP W/FACETECTOMY & FORAMINOTOMY Bilateral 01/31/2017   Procedure: LAMINECTOMY, FACETECTOMY AND FORAMINOTOMY,  SINGLE VERTEBRAL SEGMENT; EACH ADDITIONAL SEGMENT, CERVICAL, THORACIC, OR LUMBAR (LIST IN ADDITION TO PRIMARY PROCEDURE); [id number] [id number];  Surgeon: Loni Dolly, MD;  Location: [facility];  Service: Orthopedics;  Laterality: Bilateral;  . LAMINECTOMY POSTERIOR CERVICLE DECOMP W/FACETECTOMY & FORAMINOTOMY Bilateral 01/20/2018   Procedure: LAMINECTOMY, FACETECTOMY AND FORAMINOTOMY, SINGLE VERTEBRAL SEGMENT; EACH ADDITIONAL SEGMENT,  CERVICAL, THORACIC, OR LUMBAR; [id number] x3;  Surgeon: Loni Dolly, MD;  Location: [facility];  Service: Orthopedics;  Laterality: Bilateral;  . LAMINECTOMY POSTERIOR LUMBAR FACETECTOMY & FORAMINOTOMY W/DECOMP Bilateral 01/31/2017   Procedure: LAMINECTOMY, FACETECTOMY AND FORAMINOTOMY (UNILATERAL OR BILATERAL WITH DECOMPRESSION OF SPINAL CORD, CAUDA EQUINA AND/OR NERVE ROOT(S), SINGLE VERTEBRAL SEGMENT; LUMBAR;  Surgeon: Loni Dolly, MD;  Location: [facility];  Service: Orthopedics;  Laterality: Bilateral;  . LAMINECTOMY POSTERIOR LUMBAR FACETECTOMY & FORAMINOTOMY W/DECOMP Bilateral 01/20/2018   Procedure: LAMINECTOMY, FACETECTOMY AND FORAMINOTOMY (UNILATERAL OR BILATERAL WITH DECOMPRESSION OF SPINAL CORD, CAUDA EQUINA AND/OR NERVE ROOT(S), SINGLE VERTEBRAL SEGMENT; LUMBAR;  Surgeon: Loni Dolly, MD;  Location: [facility];  Service: Orthopedics;  Laterality: Bilateral;  . POSTERIOR LUMBAR SPINE FUSION ONE LEVEL LATERAL TRANSVERSE TECHNIQUE Bilateral 01/31/2017   Procedure: BILATERAL LUMBAR DECOMPRESSION L3-4, L4-5, L5-S1, DEBRIDEMENT EPIDURAL LIPOMATOSIS, SPINAL FUSION L4-5 BILATERAL, ICBG;  Surgeon: Loni Dolly, MD;  Location: [facility];  Service: Orthopedics;  Laterality: Bilateral;  . POSTERIOR LUMBAR SPINE FUSION ONE LEVEL LATERAL TRANSVERSE TECHNIQUE Bilateral 01/20/2018   Procedure: BILATERAL REEXPLORATION LUMBAR DECOMPRESSION L2-3, L3-4, L4-5, L5-S1. SPINAL FUSION L2-3, L3-4 BILATERAL. EXCISION DISC HERNIATION L3-4 LEFT. METAL REMOVAL, FUSION EXPLORATION L4-5. LOCAL AUTOGRAFT/ALLOGRAFT (VIVIGEN) left posterior iliac crest bone graft;  Surgeon: Loni Dolly, MD;  Location: [facility];  Service: Orthopedics;  Laterality: Bilateral;  . REMOVAL POSTERIOR SEGMENTAL LUMBAR/THORACIC SPINAL HARDWARE Bilateral 01/20/2018   Procedure: REMOVAL OF POSTERIOR SEGMENTAL THORACIC / LUMBAR  INSTRUMENTATION;  Surgeon: Loni Dolly, MD;  Location: [facility];  Service: Orthopedics;  Laterality:  Bilateral;  . SPINE SURGERY     2 lumber spine surgery, injection in neck  . TONSILLECTOMY      Current Medications:  Current Medications        Current Outpatient Medications  Medication Sig Dispense Refill  . acetaminophen (TYLENOL ORAL) Take by mouth    . buPROPion (WELLBUTRIN XL) 150 MG XL tablet Take 3 tablets (450 mg total) by mouth once daily 270 tablet 3  . cyclobenzaprine (FLEXERIL) 10 MG tablet 1 tablet as needed.  2  . FLUoxetine (PROZAC) 20 MG capsule TAKE 1 CAPSULE BY MOUTH EVERY DAY 90 capsule 3  . fluticasone propionate (FLONASE) 50 mcg/actuation nasal spray Place 2 sprays into both nostrils once daily 144 mL 3  . hydrOXYzine pamoate (VISTARIL) 25 MG capsule     . IBUPROFEN ORAL Take by mouth    . oxyCODONE (ROXICODONE) 5 MG immediate release tablet Take 1-2 tabs in the evening for RLS 60 tablet 0  . oxymetazoline (AFRIN) 0.05 % nasal spray Place 1 spray into both nostrils as needed for Congestion    . pantoprazole (PROTONIX) 40 MG DR tablet Take 1 tablet (40 mg total) by mouth once daily for 90 days 90 tablet 3  . rOPINIRole (REQUIP) 5 MG tablet TAKE 1 TABLET BY MOUTH THREE TIMES A DAY 270 tablet 3  . sildenafil (REVATIO) 20 mg tablet Take 3 tablets by mouth one hour prior to sex as needed.    . tadalafiL (CIALIS) 5 MG tablet Take 1 tablet (5 mg total) by mouth once daily 30 tablet  11  . testosterone cypionate (DEPO-TESTOSTERONE) 200 mg/mL injection Inject 1 mL (200 mg total) into the muscle every 14 (fourteen) days 2 mL 0  . traZODone (DESYREL) 50 MG tablet Take 1 tablet (50 mg total) by mouth as needed (sleep) 90 tablet 3  . traZODone (DESYREL) 50 MG tablet 1-2 tabs po qhs prn insomnia 180 tablet 3   No current facility-administered medications for this visit.       Allergies:       Allergies  Allergen Reactions  . Mirapex [Pramipexole] Itching    restlessness  . Ropinirole Other (See Comments)    Nasal congestion ("swells my nose  shut")    Social History:  Social History  Social History        Socioeconomic History  . Marital status: Married    Spouse name: Not on file  . Number of children: Not on file  . Years of education: Not on file  . Highest education level: Not on file  Occupational History  . Occupation: Quarry manager  Social Needs  . Financial resource strain: Not on file  . Food insecurity    Worry: Not on file    Inability: Not on file  . Transportation needs    Medical: Not on file    Non-medical: Not on file  Tobacco Use  . Smoking status: Former Smoker    Packs/day: 1.00    Years: 3.00    Pack years: 3.00    Types: Cigarettes    Quit date: 1972    Years since quitting: 49.2  . Smokeless tobacco: Current User    Types: Snuff  . Tobacco comment: Snuff 1 can/wk  Substance and Sexual Activity  . Alcohol use: Never  . Drug use: No  . Sexual activity: Yes    Partners: Female    Birth control/protection: None  Lifestyle  . Physical activity    Days per week: Not on file    Minutes per session: Not on file  . Stress: Not on file  Relationships  . Social Herbalist on phone: Not on file    Gets together: Not on file    Attends religious service: Not on file    Active member of club or organization: Not on file    Attends meetings of clubs or organizations: Not on file    Relationship status: Not on file  Other Topics Concern  . Not on file  Social History Narrative  . Not on file      Family History:       Family History  Problem Relation Age of Onset  . Cancer Mother   . Depression Mother   . Obesity Mother        valve repair  . Cancer Father   . Heart disease Father   . Heart valve disease Father     Review of Systems:   A 10+ ROS was performed, reviewed, and the pertinent orthopaedic findings are documented in the HPI.    Physical Examination:   BP (!) 140/90 (BP Location: Left upper  arm, Patient Position: Sitting, BP Cuff Size: Adult)   Ht 172.2 cm (5' 7.8")   Wt (!) 127.5 kg (281 lb)   BMI 42.98 kg/m   Patient is a well-developed, well-nourished male in no acute distress. Patient has normal mood and affect. Patient is alert and oriented to person, place, and time.   HEENT: Atraumatic, normocephalic.  Pupils equal and reactive to light.  Extraocular  motion intact.  Noninjected sclera.  Cardiovascular: Regular rate and rhythm, with no murmurs, rubs, or gallops.  Distal pulses palpable.  Respiratory: Lungs clear to auscultation bilaterally.    Left Knee: Soft tissue swelling:mild Effusion:minimal Erythema:none Crepitance:mild Tenderness:lateral Alignment:relative valgus Mediolateral laxity:lateral pseudolaxity Posterior [id number] Patellar tracking:Good tracking without evidence of subluxation or tilt Atrophy:No significantatrophy.  Quadriceps tone was fair to good. Range of motion:Patient has 98 degrees of flexion and lacks 5 degrees of extension due                                    to contracture.   Sensation intact over the saphenous, lateral sural cutaneous, superficial fibular, and deep fibular nerve distributions.  Tests Performed/Reviewed:  X-rays  Posterior, lateral, and sunrise views of the left knee were obtained.  Images reveal complete loss of lateral joint space with significant osteophyte formation and subchondral sclerosis of the bone.  Medial compartment reveals severe loss of joint space with near bone-on-bone contact.  Sunrise view reveals loss of joint space with osteophyte formation.  Impression:     ICD-10-CM  1. Primary osteoarthritis of both knees  M17.0   Plan:   The patient has end-stage degenerative changes of the  left knee.  It was explained to the patient that the condition is progressive in nature.  Having failed conservative treatment, the patient has elected to proceed with a total joint arthroplasty.  The patient will undergo a total joint arthroplasty with Dr. Marry Guan.  The risks of surgery, including blood clot and infection, were discussed with the patient.  Measures to reduce these risks, including the use of anticoagulation, perioperative antibiotics, and early ambulation were discussed.  The importance of postoperative physical therapy was discussed with the patient. The patient elects to proceed with surgery. The patient is instructed to stop all blood thinners prior to surgery. The patient is advised to stop nightly oxycodone prior to surgery as this will lead to increased difficulty with pain control postoperatively.  The patient is instructed to call the hospital the day before surgery to learn of the proper arrival time.    Contact our office with any questions or concerns.  Follow up as indicated, or sooner should any new problems arise, if conditions worsen, or if they are otherwise concerned.   Gwenlyn Fudge, PA [facility] [address], [place] [address] Phone: [phone]  This note was generated in part with voice recognition software and I apologize for any typographical errors that were not detected and corrected.     Electronically signed by Gwenlyn Fudge, [place] on 01/18/2020 6:29 PM

## 2020-01-26 NOTE — Anesthesia Procedure Notes (Signed)
Procedure Name: Intubation Performed by: Fredderick Phenix, CRNA Pre-anesthesia Checklist: Patient identified, Emergency Drugs available, Suction available and Patient being monitored Patient Re-evaluated:Patient Re-evaluated prior to induction Oxygen Delivery Method: Circle system utilized Preoxygenation: Pre-oxygenation with 100% oxygen Induction Type: IV induction Ventilation: Oral airway inserted - appropriate to patient size Laryngoscope Size: 4 and Mac Grade View: Grade II Tube type: Oral Number of attempts: 1 Airway Equipment and Method: Stylet and Oral airway Placement Confirmation: ETT inserted through vocal cords under direct vision,  positive ETCO2 and breath sounds checked- equal and bilateral Secured at: 22 cm Tube secured with: Tape Dental Injury: Teeth and Oropharynx as per pre-operative assessment

## 2020-01-26 NOTE — Transfer of Care (Signed)
Immediate Anesthesia Transfer of Care Note  Patient: Benjamin [name]  Procedure(s) Performed: COMPUTER ASSISTED TOTAL KNEE ARTHROPLASTY (Left Knee)  Patient Location: PACU  Anesthesia Type:General  Level of Consciousness: drowsy  Airway & Oxygen Therapy: Patient Spontanous Breathing and Patient connected to face mask oxygen  Post-op Assessment: Report given to RN and Post -op Vital signs reviewed and stable  Post vital signs: Reviewed and stable  Last Vitals:  Vitals Value Taken Time  BP 142/83 01/26/20 1639  Temp    Pulse 96 01/26/20 1642  Resp 12 01/26/20 1642  SpO2 92 % 01/26/20 1642  Vitals shown include unvalidated device data.  Last Pain:  Vitals:   01/26/20 1059  TempSrc: Tympanic  PainSc: 5       Patients Stated Pain Goal: 0 ([id number])  Complications: No apparent anesthesia complications

## 2020-01-26 NOTE — H&P (Signed)
The patient has been re-examined, and the chart reviewed, and there have been no interval changes to the documented history and physical.    The risks, benefits, and alternatives have been discussed at length. The patient expressed understanding of the risks benefits and agreed with plans for surgical intervention.  Mckinzi Eriksen P. Karissa Meenan, Jr. M.D.    

## 2020-01-27 NOTE — TOC Benefit Eligibility Note (Signed)
Transition of Care [facility]) Benefit Eligibility Note    Patient Details  Name: WEBB BART MRN: [id number] Date of Birth: [birth year]   Medication/Dose: Enoxaparin 40 mg. daily x 14 days  Covered?: Yes  Tier: Other(Tier 4)  Prescription Coverage [facility]  Spoke with Person/Company/Phone Number:: Lelon Frohlich [facility], [phone]  Co-Pay: $205.26 for Enoxaparin, $564.32 for Lovenox  Prior Approval: No     Additional Notes: Deductible is $445.00 but not allowed to disclose amount applied to the deductible per [place] Phone Number: 01/27/2020, 10:36 AM

## 2020-01-27 NOTE — TOC Initial Note (Signed)
Transition of Care [facility]) - Initial/Assessment Note    Patient Details  Name: Benjamin [name] MRN: [id number] Date of Birth: [birth year]  [facility]) CM/SW Contact:    Su Hilt, RN Phone Number: 01/27/2020, 1:29 PM  Clinical Narrative:                 Met with the patient to discuss DC plan and needs He lives at home with his spouse and son He has a RW and a BSC at home and does not need additional DME [facility] and I verified his address to be correct in Epic, I provied the patient with a Good RX card due to Lovenox being $205 His wife provides transportation and he is up to date with his PCP, Will continue to monitor for needs   Expected Discharge Plan: [facility] Barriers to Discharge: Continued Medical Work up   Patient Goals and CMS Choice Patient states their goals for this hospitalization and ongoing recovery are:: go home      Expected Discharge Plan and Services Expected Discharge Plan: [place]   Discharge Planning Services: CM Consult   Living arrangements for the past 2 months: Single Family Home                 DME Arranged: N/A         HH Arranged: PT HH Agency: [facility] [employer] Date [place]: 01/27/20 Time [place]: [place] Representative spoke with at [place]: Helene Kelp  Prior Living Arrangements/Services Living arrangements for the past 2 months: [place] Lives with:: Spouse, Adult Children Patient language and need for interpreter reviewed:: Yes Do you feel safe going back to the place where you live?: Yes      Need for Family Participation in Patient Care: No (Comment) Care giver support system in place?: Yes (comment) Current home services: DME(RW and BSC) Criminal Activity/Legal Involvement Pertinent to Current Situation/Hospitalization: No - Comment as needed  Activities of Daily Living Home Assistive  Devices/Equipment: None ADL Screening (condition at time of admission) Patient's cognitive ability adequate to safely complete daily activities?: No Is the patient deaf or have difficulty hearing?: No Does the patient have difficulty seeing, even when wearing glasses/contacts?: No Does the patient have difficulty concentrating, remembering, or making decisions?: No Patient able to express need for assistance with ADLs?: No Does the patient have difficulty dressing or bathing?: No Independently performs ADLs?: No Does the patient have difficulty walking or climbing stairs?: Yes Weakness of Legs: Left Weakness of Arms/Hands: None  Permission Sought/Granted   Permission granted to share information with : Yes, Verbal Permission Granted              Emotional Assessment Appearance:: Appears stated age Attitude/Demeanor/Rapport: Engaged Affect (typically observed): Appropriate Orientation: : Oriented to  Time, Oriented to Situation, Oriented to Place, Oriented to Self Alcohol / Substance Use: Not Applicable Psych Involvement: No (comment)  Admission diagnosis:  Total knee replacement status [Z96.659] Patient Active Problem List   Diagnosis Date Noted  . Total knee replacement status 01/26/2020  . Iron deficiency 10/05/2019  . Primary osteoarthritis of both knees 09/25/2019  . Lower extremity cellulitis 04/09/2019  . Left cervical radiculopathy 02/02/2019  . Personal history of other malignant neoplasm of skin 01/04/2019  . A-fib ([place]) 05/06/2018  . Bleeding ulcer 04/07/2018  . 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 Bilateral sciatica 01/31/2017  . History of lumbar laminectomy for spinal cord decompression 01/31/2017  . Neurogenic claudication due to lumbar spinal stenosis 01/31/2017  . Spondylolisthesis at L4-L5 level 01/31/2017  . Renal dysfunction 01/29/2017  . Long-term current use of testosterone replacement therapy 01/28/2017  .  Morbid obesity with BMI of 40.0-44.9, adult ([place]) 10/21/2016  . B12 deficiency 03/12/2016  . Mixed hyperlipidemia 02/13/2016  . OSA (obstructive sleep apnea) 02/13/2016  . Paroxysmal A-fib ([place]) 02/13/2016  . Tremor 02/06/2016  . Restless leg syndrome 02/06/2016  . Atrial fibrillation ([place]) 02/04/2016   PCP:  Sharyne Peach, MD [id number] [id number]: [phone]: [phone]    Social Determinants of Health (SDOH) Interventions    Readmission Risk Interventions No flowsheet data found.

## 2020-01-27 NOTE — Plan of Care (Signed)
  Problem: Clinical Measurements: Goal: Ability to maintain clinical measurements within normal limits will improve Outcome: Progressing Goal: Will remain free from infection Outcome: Progressing   Problem: Pain Managment: Goal: General experience of comfort will improve Outcome: Progressing   Problem: Safety: Goal: Ability to remain free from injury will improve Outcome: Progressing   

## 2020-01-27 NOTE — Evaluation (Signed)
Occupational Therapy Evaluation Patient Details Name: Benjamin [name] MRN: [id number] DOB: Mar 15, 1951 Today's Date: 01/27/2020    History of Present Illness Pt is a 69 year old male admitted for elective L TKR.  Pt's PMH includes 2 back surgeries, restless leg syndrome, and Afib.   Clinical Impression   Pt seen for OT evaluation this date, POD#1 from above surgery. Pt was independent in all ADLs prior to surgery, however required mod assist for lower body dressing and bathing 2/2 limited spinal ROM from previous back surgeries.  Pt familiar with reacher and sock aid, so is comfortable with using AE for lower body ADL. Pt is eager to return to PLOF with less pain and improved safety and independence. Pt currently requires moderate assist for LB dressing while in seated position due to pain, back ROM deficits, and limited AROM of L knee. Pt instructed in polar care mgt, falls prevention strategies, home/routines modifications, DME/AE for LB bathing and dressing tasks, and compression stocking mgt. Pt would benefit from skilled OT services including additional instruction in dressing techniques with or without assistive devices for dressing and bathing skills to support recall and carryover prior to discharge and ultimately to maximize safety, independence, and minimize falls risk and caregiver burden. Recommend home health OT at discharge.    Follow Up Recommendations  Home health OT;Supervision - Intermittent    Equipment Recommendations       Recommendations for Other Services       Precautions / Restrictions Precautions Precautions: Knee Required Braces or Orthoses: Knee Immobilizer - Left Restrictions Weight Bearing Restrictions: Yes LLE Weight Bearing: Weight bearing as tolerated      Mobility Bed Mobility Overal bed mobility: Modified Independent             General bed mobility comments: HOB elevated  Transfers Overall transfer level: Needs assistance   Transfers:  Sit to/from Stand;Stand Pivot Transfers Sit to Stand: Supervision Stand pivot transfers: Supervision       General transfer comment: Pt completed sit <> stand and marched in place, using surface of sink for UE support.  OTR provided supervision assist, pt requires UE support (use of RW for ambulation/functional transfers)    Balance Overall balance assessment: Needs assistance Sitting-balance support: Feet unsupported;No upper extremity supported Sitting balance-Leahy Scale: Good     Standing balance support: Single extremity supported;Bilateral upper extremity supported Standing balance-Leahy Scale: Good Standing balance comment: supervision assist provided                           ADL either performed or assessed with clinical judgement   ADL Overall ADL's : Needs assistance/impaired                                       General ADL Comments: Pt requires supervision for bed mobility and functional transfers with RW.  Pt requires mod A for LBD 2/2 inability to bend back to reach feet (2 previous surgeries).     Vision Baseline Vision/History: Wears glasses Wears Glasses: At all times Patient Visual Report: No change from baseline Vision Assessment?: No apparent visual deficits     Perception     Praxis      Pertinent Vitals/Pain Pain Assessment: 0-10 Pain Score: 8  Faces Pain Scale: Hurts whole lot Pain Location: Pt reports 8/10 pain at rest, but agreeable to mobility.  No  reports of increased pain with bed mobility and standing. Pain Descriptors / Indicators: Discomfort;Grimacing;Guarding;Restless;Pressure Pain Intervention(s): Limited activity within patient's tolerance;Monitored during session;Premedicated before session     Hand Dominance     Extremity/Trunk Assessment Upper Extremity Assessment Upper Extremity Assessment: Overall WFL for tasks assessed(5/5 deltoid, bicep, tricep strength)   Lower Extremity Assessment Lower  Extremity Assessment: Defer to PT evaluation;LLE deficits/detail LLE Deficits / Details: expected post-op pain, strength, and ROM deficits   Cervical / Trunk Assessment Cervical / Trunk Assessment: Normal   Communication Communication Communication: No difficulties   Cognition Arousal/Alertness: Awake/alert Behavior During Therapy: WFL for tasks assessed/performed Overall Cognitive Status: Within Functional Limits for tasks assessed                                 General Comments: Pt pleasant and agreeable to therapy   General Comments  O2 removed during mobility, pt's SpO2 at 95% after activity    Exercises Other Exercises Other Exercises: education provided re: safety and fall precautions, self care, LBD, bathing, functional mobility, Polar care and compression stocking management, and OT role and POC Other Exercises: OTR provided superivison assist in bed mobility, sit <> stand, marching in place   Shoulder Instructions      Home Living Family/patient expects to be discharged to:: Private residence Living Arrangements: Spouse/significant other;Children(son age 54) Available Help at Discharge: Family Type of Home: House Home Access: Stairs to enter Technical brewer of Steps: 3 Entrance Stairs-Rails: Left Home Layout: Multi-level Alternate Level Stairs-Number of Steps: unclear, pt able to live on main level   Bathroom Shower/Tub: Tub/shower unit;Walk-in shower   Bathroom Toilet: Handicapped height     Home Equipment: Environmental consultant - 2 wheels;Bedside commode;Shower seat;Tub bench;Adaptive equipment;Grab bars - tub/shower;Wheelchair - Higher education careers adviser: Reacher;Sock aid        Prior Functioning/Environment Level of Independence: Independent        Comments: Pt independent with all ADL prior to surgery, no AD used (did use a walking stick when walking in woods).  Pt is retired and enjoys being in the woods and working in his yard, however he has  not been able to do these things in recent months 2/2 L knee pain.  Pt drives, no falls in last 12 months. His wife has had cancer, and he has had 2 back surgeries, so they have various pieces of DME and AE to assist in post-op recovery.        OT Problem List: Decreased strength;Decreased range of motion;Decreased activity tolerance;Impaired balance (sitting and/or standing);Decreased knowledge of use of DME or AE;Decreased knowledge of precautions;Decreased safety awareness;Pain      OT Treatment/Interventions: Self-care/ADL training;Therapeutic exercise;Energy conservation;DME and/or AE instruction;Therapeutic activities;Patient/family education;Balance training    OT Goals(Current goals can be found in the care plan section) Acute Rehab OT Goals Patient Stated Goal: "to go home" OT Goal Formulation: With patient Time For Goal Achievement: 02/10/20 Potential to Achieve Goals: Good ADL Goals Pt Will Perform Lower Body Dressing: with modified independence;with adaptive equipment;sitting/lateral leans(with LRAD) Pt Will Transfer to Toilet: with modified independence;ambulating(with LRAD and comfort height toilet) Additional ADL Goal #1: Pt will independently verbalize x3 fall prevention strategies to improve safety and independence with ADL  OT Frequency: Min 1X/week   Barriers to D/C:            Co-evaluation              AM-PAC OT "6  Clicks" Daily Activity     Outcome Measure Help from another person eating meals?: None Help from another person taking care of personal grooming?: None Help from another person toileting, which includes using toliet, bedpan, or urinal?: A Little Help from another person bathing (including washing, rinsing, drying)?: A Lot Help from another person to put on and taking off regular upper body clothing?: None Help from another person to put on and taking off regular lower body clothing?: A Lot 6 Click Score: 19   End of Session Equipment  Utilized During Treatment: Gait belt  Activity Tolerance: Patient tolerated treatment well Patient left: in bed;with call bell/phone within reach;with bed alarm set  OT Visit Diagnosis: Other abnormalities of gait and mobility (R26.89);Muscle weakness (generalized) (M62.81);Pain Pain - Right/Left: Left Pain - part of body: Knee                Time: 0921-0950 OT Time Calculation (min): 29 min Charges:  OT General Charges $OT Visit: 1 Visit OT Evaluation $OT Eval Moderate Complexity: 1 Mod OT Treatments $Self Care/Home Management : 8-22 mins $Therapeutic Activity: 8-22 mins  Myrtie Hawk Aedyn Mckeon, OTR/L 01/27/20, 10:16 AM

## 2020-01-27 NOTE — TOC Progression Note (Signed)
Transition of Care [facility]) - Progression Note    Patient Details  Name: Benjamin [name] MRN: [id number] Date of Birth: May 18, 1951  Transition of Care [facility]) CM/SW [place], RN Phone Number: 01/27/2020, 9:16 AM  Clinical Narrative:    Requested the price of Lovenox will notify the patient once obtained        Expected Discharge Plan and Services                                                 Social Determinants of Health (SDOH) Interventions    Readmission Risk Interventions No flowsheet data found.

## 2020-01-27 NOTE — Evaluation (Signed)
Physical Therapy Evaluation Patient Details Name: Benjamin [name] MRN: [id number] DOB: 1951-07-19 Today's Date: 01/27/2020   History of Present Illness  Pt is a 69 year old male with a hx of L knee OA s/p elective L TKR.  Pt's PMH includes 2 back surgeries, restless leg syndrome, and Afib.    Clinical Impression  Pt was agreeable to PT and did well from a physical prospective. Pt was impulsive at times and removed his own oxygen without being instructed to. Given that his SpO2 was 95% once he removed it, nsg approved him for a RA trial. Minutes later before getting up, pt's SpO2 had dropped to 88% and O2 was reapplied. Patient did not require physical assistance with bed mobility or transfers and ambulated from the EOB to the recliner without much visible difficulty. When ambulating, pt relied heavily on his BUE providing support through the RW. Pt will benefit from HHPT services upon discharge to safely address deficits listed in patient problem list for decreased caregiver assistance and eventual return to PLOF.       Follow Up Recommendations Home health PT    Equipment Recommendations  Rolling walker with 5" wheels    Recommendations for Other Services       Precautions / Restrictions Precautions Precautions: Knee Precaution Booklet Issued: Yes (comment) Required Braces or Orthoses: (Pt able to perform SLR, does not require immobilizer) Restrictions Weight Bearing Restrictions: Yes LLE Weight Bearing: Weight bearing as tolerated      Mobility  Bed Mobility Overal bed mobility: Modified Independent             General bed mobility comments: HOB elevated  Transfers Overall transfer level: Needs assistance Equipment used: Rolling walker (2 wheeled) Transfers: Sit to/from Stand Sit to Stand: Min guard        General transfer comment: Pt did not require physical assistance to stand up or sit down but required management of lines and  leads  Ambulation/Gait Ambulation/Gait assistance: Min guard Gait Distance (Feet): 4 Feet Assistive device: Rolling walker (2 wheeled) Gait Pattern/deviations: Step-to pattern;Decreased step length - right;Decreased step length - left;Antalgic Gait velocity: decreased   General Gait Details: several small steps EOB to the recliner. Heavy BUE assist through [employer] Mobility    Modified Rankin (Stroke Patients Only)       Balance Overall balance assessment: Needs assistance Sitting-balance support: Feet unsupported;No upper extremity supported Sitting balance-Leahy Scale: Good Sitting balance - Comments: Able to maintain upright sitting posture without UE support   Standing balance support: Bilateral upper extremity supported;During functional activity Standing balance-Leahy Scale: Fair Standing balance comment: Heavy BUE assist through RW                             Pertinent Vitals/Pain Pain Assessment: 0-10 Pain Score: 7  Faces Pain Scale: Hurts whole lot Pain Location: L knee Pain Descriptors / Indicators: Aching;Sore Pain Intervention(s): Monitored during session;Premedicated before session    [place] expects to be discharged to:: Private residence Living Arrangements: Spouse/significant other;Children Available Help at Discharge: Family;Available 24 hours/day(Between family and friends - pt has 24/7 care if needed) Type of Home: House Home Access: Stairs to enter Entrance Stairs-Rails: Left Entrance Stairs-Number of Steps: 3 Home Layout: Multi-level;Able to live on main level with bedroom/bathroom Home Equipment: Bedside commode;Shower seat;Grab bars - tub/shower;Walker - 4 wheels;Cane - single point  Prior Function Level of Independence: Independent         Comments: Per pt, ind community amb with no AD's, ind with ADL's, denies falls     Hand Dominance        Extremity/Trunk  Assessment       Lower Extremity Assessment Lower Extremity Assessment: LLE deficits/detail LLE: Unable to fully assess due to pain      Communication   Communication: No difficulties  Cognition Arousal/Alertness: Awake/alert Behavior During Therapy: WFL for tasks assessed/performed;Impulsive Overall Cognitive Status: Within Functional Limits for tasks assessed                                 General Comments: Pt impulsive with transfers and self-management of lines and leads. Pt quickly stood up at one point when author was setting up lines and leads and at another point, pt took out his oxygena and tossed it to the side      General Comments     Exercises Total Joint Exercises Ankle Circles/Pumps: AROM;Strengthening;Both;10 reps Quad Sets: Strengthening;Left;10 reps Gluteal Sets: Strengthening;Both;10 reps Long Arc Quad: AROM;Strengthening;Both;10 reps Knee Flexion: AROM;Strengthening;Both;10 reps Goniometric ROM: L knee AROM: 6-96 deg Other Exercises Other Exercises: HEP education   Assessment/Plan    PT Assessment Patient needs continued PT services  PT Problem List Decreased strength;Decreased mobility;Decreased safety awareness;Decreased range of motion;Decreased knowledge of precautions;Obesity;Decreased activity tolerance;Cardiopulmonary status limiting activity;Decreased balance;Decreased knowledge of use of DME;Pain       PT Treatment Interventions DME instruction;Therapeutic exercise;Gait training;Balance training;Stair training;Functional mobility training;Therapeutic activities;Patient/family education    PT Goals (Current goals can be found in the Care Plan section)  Acute Rehab PT Goals Patient Stated Goal: "get out of here" PT Goal Formulation: With patient Time For Goal Achievement: 02/09/20 Potential to Achieve Goals: Good    Frequency BID   Barriers to discharge        Co-evaluation               AM-PAC PT "6 Clicks"  Mobility  Outcome Measure Help needed turning from your back to your side while in a flat bed without using bedrails?: A Little Help needed moving from lying on your back to sitting on the side of a flat bed without using bedrails?: A Little Help needed moving to and from a bed to a chair (including a wheelchair)?: A Little Help needed standing up from a chair using your arms (e.g., wheelchair or bedside chair)?: A Little Help needed to walk in hospital room?: A Little Help needed climbing 3-5 steps with a railing? : A Lot 6 Click Score: 17    End of Session Equipment Utilized During Treatment: Gait belt;Oxygen Activity Tolerance: Patient tolerated treatment well Patient left: in chair;with call bell/phone within reach;with chair alarm set;with family/visitor present;with SCD's reapplied(With Polar Care) Nurse Communication: Mobility status;Weight bearing status(Nsg notified that pt removed his oxygen and had SpO2 of 95% - given okay to trial RA. Nsg notified pt's SpO2 dropped to 88% before standing up, O2 reapplied) PT Visit Diagnosis: Unsteadiness on feet (R26.81);Other abnormalities of gait and mobility (R26.89);Muscle weakness (generalized) (M62.81);Pain Pain - Right/Left: Left Pain - part of body: Knee    Time: [id number] PT Time Calculation (min) (ACUTE ONLY): 45 min   Charges:              Annabelle Harman, SPT 01/27/20 1:17 PM

## 2020-01-27 NOTE — Progress Notes (Signed)
  Subjective: 1 Day Post-Op Procedure(s) (LRB): COMPUTER ASSISTED TOTAL KNEE ARTHROPLASTY (Left) Patient reports pain as well-controlled.   Patient is having problems with his restless leg syndrome. He states that he thought the surgery would help with this but it has not.  Plan is to go Home after hospital stay. Negative for chest pain and shortness of breath Fever: no Gastrointestinal: negative for nausea and vomiting.  Patient has not had a bowel movement. He is passing gas.   Objective: Vital signs in last 24 hours: Temp:  [98.1 F (36.7 C)-99.5 F (37.5 C)] 98.2 F (36.8 C) (03/11 0432) Pulse Rate:  [74-102] 88 (03/11 0432) Resp:  [10-18] 16 (03/11 0432) BP: (118-150)/(70-98) 118/70 (03/11 0432) SpO2:  [92 %-100 %] 100 % (03/11 0432) Weight:  [124.7 kg] 124.7 kg (03/10 1059)  Intake/Output from previous day:  Intake/Output Summary (Last 24 hours) at 01/27/2020 0745 Last data filed at 01/27/2020 0634 Gross per 24 hour  Intake 1550 ml  Output 1630 ml  Net -80 ml    Intake/Output this shift: No intake/output data recorded.  Labs: Recent Labs    01/24/20 0908  HGB 14.4   Recent Labs    01/24/20 0908  WBC 8.2  RBC 5.26  HCT 45.0  PLT 286   Recent Labs    01/24/20 0908  NA 140  K 3.9  CL 105  CO2 31  BUN 17  CREATININE 1.19  GLUCOSE 92  CALCIUM 8.9   Recent Labs    01/24/20 0908  INR 1.1     EXAM General - Patient is Alert, Appropriate and Oriented Extremity - Neurovascular intact Dorsiflexion/Plantar flexion intact Compartment soft Dressing/Incision -Postoperative dressing remains in place., Polar Care in place and working. , Hemovac in place.  Motor Function - intact, moving foot and toes well on exam.  Cardiovascular- Regular rate and rhythm, no murmurs/rubs/gallops Respiratory- Lungs clear to auscultation bilaterally Gastrointestinal- soft, nontender and active bowel sounds   Assessment/Plan: 1 Day Post-Op Procedure(s) (LRB): COMPUTER  ASSISTED TOTAL KNEE ARTHROPLASTY (Left) Active Problems:   Total knee replacement status  Estimated body mass index is 40.6 kg/m as calculated from the following:   Height as of this encounter: 5\' 9"  (1.753 m).   Weight as of this encounter: 124.7 kg. Advance diet Up with therapy Plan for discharge tomorrow    DVT Prophylaxis - Lovenox, Ted hose and foot pumps Weight-Bearing as tolerated to left leg  Cassell Smiles, PA-C [facility] 01/27/2020, 7:45 AM

## 2020-01-27 NOTE — Progress Notes (Signed)
Physical Therapy Treatment Patient Details Name: Benjamin [name] MRN: [id number] DOB: December 13, 1950 Today's Date: 01/27/2020    History of Present Illness Pt is a 69 year old male with a hx of L knee OA s/p elective L TKR.  Pt's PMH includes 2 back surgeries, restless leg syndrome, and Afib.    PT Comments    Pt pleasant and motivated to participate during the session. Pt remained somewhat impulsive and removed his oxygen once during the session. Pt found on 3LO2/min and SpO2 remained >/ 97% t/o session, nsg notified. On several occasions, pt reported that his knee was "snapping backwards" with WB and was associated with pain. One one occasion, pt reported that his knee "popped" with associated pain, but on all occasions, pt was able to continue ambulating.  Pt had good carryover with HEP and RW use/sequencing. Pt will benefit from HHPT services upon discharge to safely address deficits listed in patient problem list for decreased caregiver assistance and eventual return to PLOF.      Follow Up Recommendations  Home health PT     Equipment Recommendations  Rolling walker with 5" wheels    Recommendations for Other Services       Precautions / Restrictions Precautions Precautions: Knee Precaution Booklet Issued: Yes (comment) Required Braces or Orthoses: (Pt able to perform SLR, does not require immobilizer) Restrictions Weight Bearing Restrictions: Yes LLE Weight Bearing: Weight bearing as tolerated    Mobility  Bed Mobility Overal bed mobility: Modified Independent             General bed mobility comments: HOB elevated  Transfers Overall transfer level: Needs assistance Equipment used: Rolling walker (2 wheeled) Transfers: Sit to/from Stand Sit to Stand: Min guard         General transfer comment: Pt did not require physical assistance to stand up or sit down but required management of lines and leads. Close CGA secondary to subjective reports of knee "snapping  backwards" occasionally but pt never required physical assistance.   Ambulation/Gait Ambulation/Gait assistance: Min guard Gait Distance (Feet): 50 Feet(x2) Assistive device: Rolling walker (2 wheeled) Gait Pattern/deviations: Step-to pattern;Decreased step length - right;Decreased step length - left;Antalgic;Step-through pattern Gait velocity: decreased   General Gait Details: Pt initally ambulated with a step-to pattern with mod BUE assist and as he continued to walked, he progressed to a step-through pattern with light BUE support. Towards the end, he reverted back to a step-to pattern with mod BUE assist secondary to pain. Pt reported several instances that his knee "snapped backwards" and on one instance, pt reported a painful "pop" but was able to keep going afterwards.   Stairs             Wheelchair Mobility    Modified Rankin (Stroke Patients Only)       Balance Overall balance assessment: Needs assistance Sitting-balance support: Feet unsupported;No upper extremity supported Sitting balance-Leahy Scale: Good Sitting balance - Comments: Able to maintain upright sitting posture without UE support   Standing balance support: Bilateral upper extremity supported;During functional activity Standing balance-Leahy Scale: Good Standing balance comment: light-mod assist through RW with upright posture when ambulating                            Cognition Arousal/Alertness: Awake/alert Behavior During Therapy: WFL for tasks assessed/performed;Impulsive Overall Cognitive Status: Within Functional Limits for tasks assessed  General Comments: Pt was less impulsive with transfers and self-management of lines and leads this afternoon but did remove his oxygen once.      Exercises Total Joint Exercises Ankle Circles/Pumps: AROM;Strengthening;Both;10 reps Quad Sets: Strengthening;Left;10 reps Gluteal Sets:  Strengthening;Both;10 reps Hip ABduction/ADduction: AROM;Strengthening;Both;10 reps Straight Leg Raises: AROM;Strengthening;Both;10 reps Long Arc Quad: AROM;Strengthening;Both;10 reps Knee Flexion: AROM;Strengthening;Both;10 reps Goniometric ROM: L knee AROM: 6-96 deg Other Exercises Other Exercises: HEP education Other Exercises: education on importance of bone foam, SCD's, bed alarm, and wearing oxygen as prescribed    General Comments        Pertinent Vitals/Pain Pain Assessment: 0-10 Pain Score: 7  Pain Location: L knee Pain Descriptors / Indicators: Aching;Sore Pain Intervention(s): Monitored during session;Premedicated before session    [place] expects to be discharged to:: Private residence Living Arrangements: Spouse/significant other;Children Available Help at Discharge: Family;Available 24 hours/day(Between family and friends - pt has 24/7 care if needed) Type of Home: House Home Access: Stairs to enter Entrance Stairs-Rails: Left Home Layout: Multi-level;Able to live on main level with bedroom/bathroom Home Equipment: Bedside commode;Shower seat;Grab bars - tub/shower;Walker - 4 wheels;Cane - single point      Prior Function Level of Independence: Independent      Comments: Per pt, ind community amb with no AD's, ind with ADL's, denies falls   PT Goals (current goals can now be found in the care plan section) Acute Rehab PT Goals Patient Stated Goal: "get out of here" PT Goal Formulation: With patient Time For Goal Achievement: 02/09/20 Potential to Achieve Goals: Good Progress towards PT goals: Progressing toward goals    Frequency    BID      PT Plan Current plan remains appropriate    Co-evaluation              AM-PAC PT "6 Clicks" Mobility   Outcome Measure  Help needed turning from your back to your side while in a flat bed without using bedrails?: None Help needed moving from lying on your back to sitting on the  side of a flat bed without using bedrails?: None Help needed moving to and from a bed to a chair (including a wheelchair)?: A Little Help needed standing up from a chair using your arms (e.g., wheelchair or bedside chair)?: A Little Help needed to walk in hospital room?: A Little Help needed climbing 3-5 steps with a railing? : A Little 6 Click Score: 20    End of Session Equipment Utilized During Treatment: Gait belt;Oxygen Activity Tolerance: Patient tolerated treatment well Patient left: in bed;with call bell/phone within reach;with bed alarm set;with SCD's reapplied, Polar Care applied Nurse Communication: Mobility status;Weight bearing status(nsg notified that pt's SpO2 remained >/97% during amb) PT Visit Diagnosis: Unsteadiness on feet (R26.81);Other abnormalities of gait and mobility (R26.89);Muscle weakness (generalized) (M62.81);Pain Pain - Right/Left: Right Pain - part of body: Knee     Time: [id number] PT Time Calculation (min) (ACUTE ONLY): 53 min  Charges:  $Therapeutic Exercise: 8-22 mins                     Annabelle Harman, SPT 01/27/20 4:25 PM

## 2020-01-28 MED ORDER — ENOXAPARIN SODIUM 40 MG/0.4ML ~~LOC~~ SOLN: 40.0000 mg | SUBCUTANEOUS | 0 refills | Status: DC

## 2020-01-28 MED ORDER — OXYCODONE HCL 5 MG PO TABS: 5.0000 mg | ORAL_TABLET | ORAL | 0 refills | Status: DC | PRN

## 2020-01-28 MED ORDER — TRAMADOL HCL 50 MG PO TABS: 50.0000 mg | ORAL_TABLET | ORAL | 0 refills | Status: DC | PRN

## 2020-01-28 MED ORDER — CELECOXIB 200 MG PO CAPS: 200.0000 mg | ORAL_CAPSULE | Freq: Two times a day (BID) | ORAL | 0 refills | Status: DC

## 2020-01-28 NOTE — TOC Progression Note (Signed)
Transition of Care [facility]) - Progression Note    Patient Details  Name: Benjamin [name] MRN: [id number] Date of Birth: 1951-07-05  [facility]) CM/SW Contact  Su Hilt, RN Phone Number: 01/28/2020, 8:26 AM  Clinical Narrative:    Pt informed me that the patient has a rollator at home and that would not be safe using it until healed, He will need a RW, I notified Brad with [employer] of the need   Expected Discharge Plan: [place] Barriers to Discharge: Barriers Resolved  Expected Discharge Plan and Services Expected Discharge Plan: [place]   Discharge Planning Services: CM Consult   Living arrangements for the past 2 months: Single Family Home                 DME Arranged: Walker rolling DME Agency: [employer] Date DME Agency Contacted: 01/28/20 Time DME Agency Contacted: [phone] Representative spoke with at DME Agency: [place]: PT [place]: [facility] [employer] Date [place]: 01/27/20 Time [place]: [place] Representative spoke with at [place]: [place] ([place]) Interventions    Readmission Risk Interventions No flowsheet data found.

## 2020-01-28 NOTE — Discharge Summary (Signed)
Physician Discharge Summary  Patient ID: Benjamin [name] MRN: [id number] DOB/AGE: 1951/06/02 69 y.o.  Admit date: 01/26/2020 Discharge date: 01/28/2020  Admission Diagnoses:  Total knee replacement status [Z96.659]  Surgeries:Procedure(s):   Left total knee arthroplasty using computer-assisted navigation  SURGEON:  Marciano Sequin. M.D.  ASSISTANT: Cassell Smiles, PA-C (present and scrubbed throughout the case, critical for assistance with exposure, retraction, instrumentation, and closure)  ANESTHESIA: general  ESTIMATED BLOOD LOSS: 50 mL  FLUIDS REPLACED: 1300 mL of crystalloid  TOURNIQUET TIME: 117 minutes  DRAINS: 2 medium Hemovac drains  SOFT TISSUE RELEASES: Anterior cruciate ligament, posterior cruciate ligament, deep medial collateral ligament, patellofemoral ligament, and posterolateral corner  IMPLANTS UTILIZED: DePuy Attune size 7 posterior stabilized femoral component (cemented), size 7 rotating platform tibial component (cemented), 41 mm medialized dome patella (cemented), and a 5 mm stabilized rotating platform polyethylene insert.  Discharge Diagnoses: Patient Active Problem List   Diagnosis Date Noted  . Total knee replacement status 01/26/2020  . Iron deficiency 10/05/2019  . Primary osteoarthritis of both knees 09/25/2019  . Lower extremity cellulitis 04/09/2019  . Left cervical radiculopathy 02/02/2019  . Personal history of other malignant neoplasm of skin 01/04/2019  . A-fib ([place]) 05/06/2018  . Bleeding ulcer 04/07/2018  . 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 Bilateral sciatica 01/31/2017  . History of lumbar laminectomy for spinal cord decompression 01/31/2017  . Neurogenic claudication due to lumbar spinal stenosis 01/31/2017  . Spondylolisthesis at L4-L5 level 01/31/2017  . Renal dysfunction 01/29/2017  . Long-term current use of testosterone replacement therapy 01/28/2017  . Morbid obesity with  BMI of 40.0-44.9, adult ([place]) 10/21/2016  . B12 deficiency 03/12/2016  . Mixed hyperlipidemia 02/13/2016  . OSA (obstructive sleep apnea) 02/13/2016  . Paroxysmal A-fib ([place]) 02/13/2016  . Tremor 02/06/2016  . Restless leg syndrome 02/06/2016  . Atrial fibrillation ([place]) 02/04/2016    Past Medical History:  Diagnosis Date  . Arthritis   . Depression   . GERD (gastroesophageal reflux disease)   . OSA (obstructive sleep apnea)    uses C-pap machine      Transfusion:    Consultants (if any):   Discharged Condition: Improved  Hospital Course: Benjamin [name] is an 69 y.o. male who was admitted 01/26/2020 with a diagnosis of left knee osteoarthritis and went to the operating room on 01/26/2020 and underwent left total knee arthroplasty. The patient received perioperative antibiotics for prophylaxis (see below). The patient tolerated the procedure well and was transported to PACU in stable condition. After meeting PACU criteria, the patient was subsequently transferred to the Orthopaedics/Rehabilitation unit.   The patient received DVT prophylaxis in the form of early mobilization, Lovenox, Foot Pumps and TED hose. A sacral pad had been placed and heels were elevated off of the bed with rolled towels in order to protect skin integrity. Foley catheter was discontinued on postoperative day #1. Wound drains were discontinued on postoperative day #2. The surgical incision was healing well without signs of infection.  Physical therapy was initiated postoperatively for transfers, gait training, and strengthening. Occupational therapy was initiated for activities of daily living and evaluation for assisted devices. Rehabilitation goals were reviewed in detail with the patient. The patient made steady progress with physical therapy and physical therapy recommended discharge to Home.   The patient achieved his preliminary goals of this hospitalization and was felt to be medically and orthopaedically  appropriate for discharge.  He was given perioperative antibiotics:  Anti-infectives (From  admission, onward)   Start     Dose/Rate Route Frequency Ordered Stop   01/26/20 1900  ceFAZolin (ANCEF) IVPB 2g/100 mL premix     2 g 200 mL/hr over 30 Minutes Intravenous Every 6 hours 01/26/20 1807 01/27/20 1531   01/26/20 1014  ceFAZolin (ANCEF) 2-4 GM/100ML-% IVPB    Note to Pharmacy: Register, Karen   : cabinet override      01/26/20 1014 01/26/20 2229   01/26/20 0600  ceFAZolin (ANCEF) IVPB 2g/100 mL premix  Status:  Discontinued     2 g 200 mL/hr over 30 Minutes Intravenous On call to O.R. 01/26/20 [id number] 01/26/20 [id number]    .  Recent vital signs:  Vitals:   01/28/20 0346 01/28/20 0747  BP:  (!) 158/70  Pulse: 86 92  Resp:    Temp:  99.1 F (37.3 C)  SpO2: 99% 98%    Recent laboratory studies:  No results for input(s): WBC, HGB, HCT, PLT, K, CL, CO2, BUN, CREATININE, GLUCOSE, CALCIUM, LABPT, INR in the last 72 hours.  Diagnostic Studies: DG Knee Left Port  Result Date: 01/26/2020 CLINICAL DATA:  Postop EXAM: PORTABLE LEFT KNEE - 1-2 VIEW COMPARISON:  04/13/2019 FINDINGS: Status post left knee replacement with normal alignment and intact hardware. No fracture is seen. Suprapatellar drainage catheter. Scattered gas in the soft tissues consistent with recent surgery. IMPRESSION: Status post left knee replacement with expected postsurgical changes. Electronically Signed   By: Donavan Foil M.D.   On: 01/26/2020 17:11    Discharge Medications:   Allergies as of 01/28/2020      Reactions   Pramipexole Itching      Medication List    STOP taking these medications   ibuprofen 200 MG tablet Commonly known as: ADVIL     TAKE these medications   buPROPion 150 MG 24 hr tablet Commonly known as: WELLBUTRIN XL Take 450 mg by mouth daily.   celecoxib 200 MG capsule Commonly known as: CELEBREX Take 1 capsule (200 mg total) by mouth 2 (two) times daily.   enoxaparin 40 MG/0.4ML  injection Commonly known as: LOVENOX Inject 0.4 mLs (40 mg total) into the skin daily for 14 days.   FLUoxetine 20 MG capsule Commonly known as: PROZAC Take 20 mg by mouth every morning.   Gabapentin (Once-Daily) 300 MG Tabs Take by mouth.   oxyCODONE 5 MG immediate release tablet Commonly known as: Oxy IR/ROXICODONE Take 1 tablet (5 mg total) by mouth every 4 (four) hours as needed for moderate pain (pain score 4-6). What changed:   when to take this  reasons to take this   oxymetazoline 0.05 % nasal spray Commonly known as: AFRIN Place 1 spray into both nostrils 2 (two) times daily as needed for congestion.   pantoprazole 40 MG tablet Commonly known as: PROTONIX Take 40 mg by mouth daily.   ropinirole 5 MG tablet Commonly known as: REQUIP Take 15 mg by mouth at bedtime.   tadalafil 5 MG tablet Commonly known as: CIALIS Take 5 mg by mouth daily as needed for erectile dysfunction.   testosterone cypionate 200 MG/ML injection Commonly known as: DEPOTESTOSTERONE CYPIONATE Inject 200 mg into the muscle every 14 (fourteen) days.   traMADol 50 MG tablet Commonly known as: ULTRAM Take 1 tablet (50 mg total) by mouth every 4 (four) hours as needed for moderate pain.   traZODone 50 MG tablet Commonly known as: DESYREL Take 1-2 tablets by mouth at bedtime as needed for sleep.  Durable Medical Equipment  (From admission, onward)         Start     Ordered   01/26/20 1808  DME Walker rolling  Once    Question:  Patient needs a walker to treat with the following condition  Answer:  Total knee replacement status   01/26/20 1807   01/26/20 1808  DME Bedside commode  Once    Question:  Patient needs a bedside commode to treat with the following condition  Answer:  Total knee replacement status   01/26/20 1807          Disposition: home with home health PT     Follow-up Information    U[place] Heir On 02/10/2020.   Specialty: Orthopedic  Surgery Why: at 9:15am Contact information: [place] [address] [phone]        Dereck Leep, MD On 03/09/2020.   Specialty: Orthopedic Surgery Why: at 2:45pm Contact information: [place] [id number] [place], PA-C 01/28/2020, 2:13 PM

## 2020-01-28 NOTE — Progress Notes (Signed)
  Subjective: 2 Days Post-Op Procedure(s) (LRB): COMPUTER ASSISTED TOTAL KNEE ARTHROPLASTY (Left) Patient reports pain as 7 on 0-10 scale.   Plan is to go Home after hospital stay. Negative for chest pain and shortness of breath Fever: no Gastrointestinal: negative for nausea and vomiting.  Patient has not had a bowel movement.  Objective: Vital signs in last 24 hours: Temp:  [98 F (36.7 C)-99.1 F (37.3 C)] 99.1 F (37.3 C) (03/12 0747) Pulse Rate:  [79-92] 92 (03/12 0747) Resp:  [18] 18 (03/11 1621) BP: (110-158)/(64-72) 158/70 (03/12 0747) SpO2:  [97 %-100 %] 98 % (03/12 0747)  Intake/Output from previous day:  Intake/Output Summary (Last 24 hours) at 01/28/2020 0858 Last data filed at 01/28/2020 0748 Gross per 24 hour  Intake 1691.54 ml  Output 1420 ml  Net 271.54 ml    Intake/Output this shift: Total I/O In: -  Out: 50 [Drains:50]  Labs: No results for input(s): HGB in the last 72 hours. No results for input(s): WBC, RBC, HCT, PLT in the last 72 hours. No results for input(s): NA, K, CL, CO2, BUN, CREATININE, GLUCOSE, CALCIUM in the last 72 hours. No results for input(s): LABPT, INR in the last 72 hours.   EXAM General - Patient is Alert, Appropriate and Oriented Extremity - Neurovascular intact Dorsiflexion/Plantar flexion intact Compartment soft Dressing/Incision -Postoperative dressing remains in place., Polar Care in place and working. , Hemovac in place.  Following removal of postoperative dressing, no drainage or erythema noted at the incision site. Motor Function - intact, moving foot and toes well on exam.  Cardiovascular- Regular rate and rhythm, no murmurs/rubs/gallops Respiratory- Lungs clear to auscultation bilaterally Gastrointestinal- soft, nontender and active bowel sounds   Assessment/Plan: 2 Days Post-Op Procedure(s) (LRB): COMPUTER ASSISTED TOTAL KNEE ARTHROPLASTY (Left) Active Problems:   Total knee replacement status  Estimated body  mass index is 40.6 kg/m as calculated from the following:   Height as of this encounter: 5\' 9"  (1.753 m).   Weight as of this encounter: 124.7 kg. Advance diet Up with therapy  Given patient's current progress with therapy, discharge today is unlikely.  We will anticipate discharge tomorrow.  If patient makes unexpected progress with therapy today, he may be discharged later this evening.  DVT Prophylaxis - Lovenox, Ted hose and foot pumps Weight-Bearing as tolerated to left leg  Cassell Smiles, PA-C [facility] 01/28/2020, 8:58 AM

## 2020-01-28 NOTE — TOC Transition Note (Signed)
Transition of Care [facility]) - CM/SW Discharge Note   Patient Details  Name: DAMONT JANKIEWICZ MRN: [id number] Date of Birth: Mar 01, 1951  Transition of Care [facility]) CM/SW Contact:  Su Hilt, RN Phone Number: 01/28/2020, 2:21 PM   Clinical Narrative:    Patient to DC home today with [facility], has a Environmental consultant in the room to take home with him , He was provided with a good RX card, he has no additional needs   Final next level of care: Home w Home Health Services Barriers to Discharge: Barriers Resolved   Patient Goals and CMS Choice Patient states their goals for this hospitalization and ongoing recovery are:: go home      Discharge Placement                       Discharge Plan and Services   Discharge Planning Services: CM Consult            DME Arranged: [place]ford Rile rolling DME Agency: [employer] Date DME Agency Contacted: 01/28/20 Time DME Agency Contacted: [phone] Representative spoke with at DME Agency: [place]: PT [place]: [facility] [employer] Date [place]: 01/27/20 Time [place]: [place] Representative spoke with at [place]: [place] ([place]) Interventions     Readmission Risk Interventions No flowsheet data found.

## 2020-01-28 NOTE — Progress Notes (Signed)
Patients oxygen level is stable. Last check was at 0346 and he was 99 on RA. Removed oxygen.

## 2020-01-28 NOTE — Progress Notes (Signed)
Physical Therapy Treatment Patient Details Name: Benjamin [name] MRN: [id number] DOB: [birth year] Today's Date: 01/28/2020    History of Present Illness Pt is a 69 year old male with a hx of L knee OA s/p elective L TKR.  Pt's PMH includes 2 back surgeries, restless leg syndrome, and Afib.    PT Comments    Pt pleasant and motivated to participate during the session. Pt was able to complete a lap around the nsg station and stair training without physical assistance. Pt had good carryover for HEP and transfers and only required minimal cueing with sequencing for stairs. Pt demonstrated proper RW use with good step length but was only able to amb this way for short stretches before reducing his step-length and increasing his reliance on the RW secondary to reported pain. Pt will benefit from HHPT services upon discharge to safely address deficits listed in patient problem list for decreased caregiver assistance and eventual return to PLOF.      Follow Up Recommendations  Home health PT     Equipment Recommendations  Rolling walker with 5" wheels    Recommendations for Other Services       Precautions / Restrictions Precautions Precautions: Knee Precaution Booklet Issued: Yes (comment) Required Braces or Orthoses: (Pt able to perform SLR, does not require immobilizer) Restrictions Weight Bearing Restrictions: No LLE Weight Bearing: Weight bearing as tolerated    Mobility  Bed Mobility Overal bed mobility: Modified Independent             General bed mobility comments: HOB elevated  Transfers Overall transfer level: Needs assistance Equipment used: Rolling walker (2 wheeled) Transfers: Sit to/from Stand Sit to Stand: Supervision        General transfer comment: Occasional cueing for hand and foot placement for stand-to-sit transfers. Good carryover from yesterday  Ambulation/Gait Ambulation/Gait assistance: Min guard Gait Distance (Feet): 100 Feet(x2) Assistive  device: Rolling walker (2 wheeled) Gait Pattern/deviations: Step-to pattern;Decreased step length - right;Decreased step length - left;Antalgic;Step-through pattern;Trunk flexed;Decreased weight shift to left Gait velocity: decreased   General Gait Details: Pt initally ambulated with a step-to pattern with mod-max BUE assist and as he continued to walked, he progressed to a step-through pattern with light BUE support for short distances before reverting to more of an antalgic gait pattern. Pt had minimal complaints of popping on his knee but there was not associated sharp pain like he expressed yesterday   Stairs Stairs: Yes Stairs assistance: Min guard Stair Management: One rail Left;Two rails;Step to pattern;Forwards;Backwards Number of Stairs: 3 steps x1, 1 step x2 General stair comments: Pt started with bilat railings with one step fwd and one step down backward. Pt then progressed to just using a handrail on the L and did 3 steps up forwards, and 3 steps down backwards. Pt was educated and provided cueing for sequencing and perfroming a quad set when in L single leg stance to prevent buckling. Pt demonstarted good understanding of the task   Wheelchair Mobility    Modified Rankin (Stroke Patients Only)       Balance Overall balance assessment: Needs assistance Sitting-balance support: Feet unsupported;No upper extremity supported Sitting balance-Leahy Scale: Good Sitting balance - Comments: Able to maintain upright sitting posture without UE support   Standing balance support: Bilateral upper extremity supported;During functional activity Standing balance-Leahy Scale: Good Standing balance comment: Pt did not require UE assistance with static standing and used BUE assistance through the RW during ambulation that decreased in amount as he  continued walking                            Cognition Arousal/Alertness: Awake/alert Behavior During Therapy: WFL for tasks  assessed/performed Overall Cognitive Status: Within Functional Limits for tasks assessed                                 General Comments: Pt was not impulsive this morning like he was in previous sessions      Exercises Total Joint Exercises Ankle Circles/Pumps: AROM;Strengthening;Both;10 reps Quad Sets: Strengthening;Left;10 reps Gluteal Sets: Strengthening;Both;10 reps Hip ABduction/ADduction: AROM;Strengthening;Both;10 reps Straight Leg Raises: AROM;Strengthening;Both;10 reps Long Arc Quad: AROM;Strengthening;Both;10 reps Knee Flexion: AROM;Strengthening;Both;10 reps Goniometric ROM: L knee AROM: 3-87 deg Marching in Standing: AROM;Strengthening;Both;5 reps Other Exercises Other Exercises: HEP education Other Exercises: Verbally simulated car transfer training Other Exercises: Sit-from stand transfer training Other Exercises: stair training    General Comments        Pertinent Vitals/Pain Pain Score: 6 ("8 or 9 before pain meds, 5 or 6 now") Pain Location: L knee Pain Descriptors / Indicators: Aching;Sore Pain Intervention(s): Monitored during session;Premedicated before session;Ice applied    Home Living                      Prior Function            PT Goals (current goals can now be found in the care plan section) Progress towards PT goals: Progressing toward goals    Frequency    BID      PT Plan Current plan remains appropriate    Co-evaluation              AM-PAC PT "6 Clicks" Mobility   Outcome Measure  Help needed turning from your back to your side while in a flat bed without using bedrails?: None Help needed moving from lying on your back to sitting on the side of a flat bed without using bedrails?: None Help needed moving to and from a bed to a chair (including a wheelchair)?: A Little Help needed standing up from a chair using your arms (e.g., wheelchair or bedside chair)?: A Little Help needed to walk in  hospital room?: A Little Help needed climbing 3-5 steps with a railing? : A Little 6 Click Score: 20    End of Session Equipment Utilized During Treatment: Gait belt Activity Tolerance: Patient tolerated treatment well Patient left: in bed;with call bell/phone within reach;with bed alarm set;with SCD's reapplied;Other (comment)(Polar Care) Nurse Communication: Mobility status;Weight bearing status PT Visit Diagnosis: Unsteadiness on feet (R26.81);Other abnormalities of gait and mobility (R26.89);Muscle weakness (generalized) (M62.81);Pain Pain - Right/Left: Right Pain - part of body: Knee     Time: [id number] PT Time Calculation (min) (ACUTE ONLY): 44 min  Charges:                        Annabelle Harman, SPT 01/28/20 10:32 AM

## 2020-01-28 NOTE — Anesthesia Postprocedure Evaluation (Signed)
Anesthesia Post Note  Patient: Benjamin [name]  Procedure(s) Performed: COMPUTER ASSISTED TOTAL KNEE ARTHROPLASTY (Left Knee)  Patient location during evaluation: PACU Anesthesia Type: General Level of consciousness: awake and alert and oriented Pain management: pain level controlled Vital Signs Assessment: post-procedure vital signs reviewed and stable Respiratory status: spontaneous breathing Cardiovascular status: blood pressure returned to baseline Anesthetic complications: no     Last Vitals:  Vitals:   01/28/20 0346 01/28/20 0747  BP:  (!) 158/70  Pulse: 86 92  Resp:    Temp:  37.3 C  SpO2: 99% 98%    Last Pain:  Vitals:   01/28/20 1503  TempSrc:   PainSc: 4                  Chinmayi Rumer

## 2020-01-28 NOTE — Care Management Important Message (Signed)
Important Message  Patient Details  Name: ANIRUDH GIVINS MRN: [id number] Date of Birth: 1951/08/21   Medicare Important Message Given:  N/A - LOS <3 / Initial given by admissions     Juliann Pulse A Kamila Broda 01/28/2020, 8:42 AM

## 2020-01-28 NOTE — Progress Notes (Signed)
DISCHARGE NOTE:  Pt given discharge instructions. Pt verbalized understanding. TED hose on both leg. Pts walker set with pt. Pt wheeled to car by staff. Wife providing transportation.

## 2020-03-12 DIAGNOSIS — Z96652 Presence of left artificial knee joint: Secondary | ICD-10-CM | POA: Insufficient documentation

## 2020-06-05 ENCOUNTER — Other Ambulatory Visit: Payer: Self-pay

## 2020-06-05 ENCOUNTER — Emergency Department: Payer: Medicare Other

## 2020-06-05 ENCOUNTER — Emergency Department
Admission: EM | Admit: 2020-06-05 | Discharge: 2020-06-05 | Disposition: A | Discharge status: Home / Self Care | Payer: Medicare Other | Attending: Student in an Organized Health Care Education/Training Program | Admitting: Student in an Organized Health Care Education/Training Program

## 2020-06-05 DIAGNOSIS — K219 Gastro-esophageal reflux disease without esophagitis: Secondary | ICD-10-CM | POA: Insufficient documentation

## 2020-06-05 DIAGNOSIS — R600 Localized edema: Secondary | ICD-10-CM | POA: Insufficient documentation

## 2020-06-05 DIAGNOSIS — I4891 Unspecified atrial fibrillation: Secondary | ICD-10-CM | POA: Diagnosis not present

## 2020-06-05 DIAGNOSIS — Z7901 Long term (current) use of anticoagulants: Secondary | ICD-10-CM | POA: Diagnosis not present

## 2020-06-05 DIAGNOSIS — M7989 Other specified soft tissue disorders: Secondary | ICD-10-CM | POA: Insufficient documentation

## 2020-06-05 DIAGNOSIS — G4733 Obstructive sleep apnea (adult) (pediatric): Secondary | ICD-10-CM | POA: Insufficient documentation

## 2020-06-05 DIAGNOSIS — Z96653 Presence of artificial knee joint, bilateral: Secondary | ICD-10-CM | POA: Diagnosis not present

## 2020-06-05 DIAGNOSIS — Z79899 Other long term (current) drug therapy: Secondary | ICD-10-CM | POA: Diagnosis not present

## 2020-06-05 LAB — BASIC METABOLIC PANEL
Anion gap: 7 (ref 5–15)
BUN: 16 mg/dL (ref 8–23)
CO2: 25 mmol/L (ref 22–32)
Calcium: 8.9 mg/dL (ref 8.9–10.3)
Chloride: 105 mmol/L (ref 98–111)
Creatinine, Ser: 1.21 mg/dL (ref 0.61–1.24)
GFR calc Af Amer: >60 mL/min (ref >60)
GFR calc non Af Amer: >60 mL/min (ref >60)
Glucose, Bld: 121 mg/dL — ABNORMAL HIGH (ref 70–99)
Potassium: 4.2 mmol/L (ref 3.5–5.1)
Sodium: 137 mmol/L (ref 135–145)

## 2020-06-05 LAB — URINALYSIS, COMPLETE (UACMP) WITH MICROSCOPIC
Bilirubin Urine: NEGATIVE
Glucose, UA: NEGATIVE mg/dL
Hgb urine dipstick: NEGATIVE
Ketones, ur: NEGATIVE mg/dL
Leukocytes,Ua: NEGATIVE
Nitrite: NEGATIVE
Protein, ur: NEGATIVE mg/dL
Specific Gravity, Urine: 1.018 (ref 1.005–1.030)
pH: 5 (ref 5.0–8.0)

## 2020-06-05 LAB — CBC
HCT: 39.3 % (ref 39.0–52.0)
Hemoglobin: 12.1 g/dL — ABNORMAL LOW (ref 13.0–17.0)
MCH: 22.8 pg — ABNORMAL LOW (ref 26.0–34.0)
MCHC: 30.8 g/dL (ref 30.0–36.0)
MCV: 74.2 fL — ABNORMAL LOW (ref 80.0–100.0)
Platelets: 306 10*3/uL (ref 150–400)
RBC: 5.3 MIL/uL (ref 4.22–5.81)
RDW: 17.9 % — ABNORMAL HIGH (ref 11.5–15.5)
WBC: 8.6 10*3/uL (ref 4.0–10.5)
nRBC: 0 % (ref 0.0–0.2)

## 2020-06-05 LAB — HEPATIC FUNCTION PANEL
ALT: 16 U/L (ref 0–44)
AST: 21 U/L (ref 15–41)
Albumin: 3.6 g/dL (ref 3.5–5.0)
Alkaline Phosphatase: 62 U/L (ref 38–126)
Bilirubin, Direct: <0.1 mg/dL (ref 0.0–0.2)
Total Bilirubin: 0.5 mg/dL (ref 0.3–1.2)
Total Protein: 6.4 g/dL — ABNORMAL LOW (ref 6.5–8.1)

## 2020-06-05 LAB — BRAIN NATRIURETIC PEPTIDE: B Natriuretic Peptide: 48.2 pg/mL (ref 0.0–100.0)

## 2020-06-05 MED ORDER — FUROSEMIDE 20 MG PO TABS: 20.0000 mg | ORAL_TABLET | Freq: Every day | ORAL | 0 refills | Status: DC

## 2020-06-05 NOTE — ED Triage Notes (Addendum)
Pt comes via POV from home with c/o bilateral leg swelling and left calf pain. Pt states this swelling has been going on since Thursday.  Pt denies any lasix.  Pt states about 30 lbs weight gain in last month.

## 2020-06-05 NOTE — ED Provider Notes (Signed)
[facility] Emergency Department Provider Note    First MD Initiated Contact with Patient 06/05/20 1320     (approximate)  I have reviewed the triage vital signs and the nursing notes.   HISTORY  Chief Complaint Leg Pain and Leg Swelling    HPI Benjamin [name] is a 69 y.o. male below listed past medical history presents to the ER for evaluation of worsening leg swelling over the past several weeks now developing some achiness in the back of his calf.  Just recently had knee surgery.  Is not any anticoagulation.  Denies any chest pain or pressure.  Is not having any worsening orthopnea or exertional dyspnea.  Has not been placed on any sort of Lasix or diuretic previously.  Denies any known history of CHF.  No history of DVT or PE.    Past Medical History:  Diagnosis Date  . Arthritis   . Depression   . GERD (gastroesophageal reflux disease)   . OSA (obstructive sleep apnea)    uses C-pap machine    Family History  Problem Relation Age of Onset  . CAD Neg Hx   . Diabetes Mellitus II Neg Hx    Past Surgical History:  Procedure Laterality Date  . BACK SURGERY  01/2017 and 01/2018  . colonscopy      x 2  . KNEE ARTHROPLASTY Left 01/26/2020   Procedure: COMPUTER ASSISTED TOTAL KNEE ARTHROPLASTY;  Surgeon: Dereck Leep, MD;  Location: [facility] ORS;  Service: Orthopedics;  Laterality: Left;  . KNEE SURGERY  1991  . none    . TONSILLECTOMY  1964   Patient Active Problem List   Diagnosis Date Noted  . Total knee replacement status 01/26/2020  . Iron deficiency 10/05/2019  . Primary osteoarthritis of both knees 09/25/2019  . Lower extremity cellulitis 04/09/2019  . Left cervical radiculopathy 02/02/2019  . Personal history of other malignant neoplasm of skin 01/04/2019  . A-fib ([place]) 05/06/2018  . Bleeding ulcer 04/07/2018  . 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 Bilateral sciatica 01/31/2017  . History of  lumbar laminectomy for spinal cord decompression 01/31/2017  . Neurogenic claudication due to lumbar spinal stenosis 01/31/2017  . Spondylolisthesis at L4-L5 level 01/31/2017  . Renal dysfunction 01/29/2017  . Long-term current use of testosterone replacement therapy 01/28/2017  . Morbid obesity with BMI of 40.0-44.9, adult ([place]) 10/21/2016  . B12 deficiency 03/12/2016  . Mixed hyperlipidemia 02/13/2016  . OSA (obstructive sleep apnea) 02/13/2016  . Paroxysmal A-fib ([place]) 02/13/2016  . Tremor 02/06/2016  . Restless leg syndrome 02/06/2016  . Atrial fibrillation ([place]) 02/04/2016      Prior to Admission medications   Medication Sig Start Date End Date Taking? Authorizing Provider  buPROPion (WELLBUTRIN XL) 150 MG 24 hr tablet Take 450 mg by mouth daily.  01/20/16   [provider]  celecoxib (CELEBREX) 200 MG capsule Take 1 capsule (200 mg total) by mouth 2 (two) times daily. 01/28/20   Tamala Julian B, PA-C  enoxaparin (LOVENOX) 40 MG/0.4ML injection Inject 0.4 mLs (40 mg total) into the skin daily for 14 days. 01/28/20 02/11/20  Fausto Skillern, PA-C  FLUoxetine (PROZAC) 20 MG capsule Take 20 mg by mouth every morning. 03/04/18   [provider]  furosemide (LASIX) 20 MG tablet Take 1 tablet (20 mg total) by mouth daily. 06/05/20 06/05/21  Merlyn Lot, MD  Gabapentin, Once-Daily, 300 MG TABS Take by mouth.    [provider]  oxyCODONE (OXY IR/ROXICODONE) 5 MG immediate release tablet Take 1 tablet (5 mg total) by mouth every 4 (four) hours as needed for moderate pain (pain score 4-6). 01/28/20   Fausto Skillern, PA-C  oxymetazoline (AFRIN) 0.05 % nasal spray Place 1 spray into both nostrils 2 (two) times daily as needed for congestion.    [provider]  pantoprazole (PROTONIX) 40 MG tablet Take 40 mg by mouth daily. 01/23/16   [provider]  ropinirole (REQUIP) 5 MG tablet Take 15 mg by mouth at bedtime.  03/11/19   [provider]  tadalafil (CIALIS) 5 MG tablet Take 5 mg by mouth daily as needed for erectile dysfunction.    [provider]  testosterone cypionate (DEPOTESTOSTERONE CYPIONATE) 200 MG/ML injection Inject 200 mg into the muscle every 14 (fourteen) days. 03/28/18   [provider]  traMADol (ULTRAM) 50 MG tablet Take 1 tablet (50 mg total) by mouth every 4 (four) hours as needed for moderate pain. 01/28/20   Fausto Skillern, PA-C  traZODone (DESYREL) 50 MG tablet Take 1-2 tablets by mouth at bedtime as needed for sleep. 01/18/20   [provider]    Allergies Pramipexole    Social History Social History   Tobacco Use  . Smoking status: Never Smoker  . Smokeless tobacco: Never Used  Vaping Use  . Vaping Use: Never used  Substance Use Topics  . Alcohol use: Yes  . Drug use: No    Review of Systems Patient denies headaches, rhinorrhea, blurry vision, numbness, shortness of breath, chest pain, edema, cough, abdominal pain, nausea, vomiting, diarrhea, dysuria, fevers, rashes or hallucinations unless otherwise stated above in HPI. ____________________________________________   PHYSICAL EXAM:  VITAL SIGNS: Vitals:   06/05/20 0953 06/05/20 1338  BP: (!) 141/78 126/70  Pulse: 78 71  Resp: 18 18  Temp: 97.8 F (36.6 C)   SpO2: 100% 97%    Constitutional: Alert and oriented.  Eyes: Conjunctivae are normal.  Head: Atraumatic. Nose: No congestion/rhinnorhea. Mouth/Throat: Mucous membranes are moist.   Neck: No stridor. Painless ROM.  Cardiovascular: Normal rate, regular rhythm. Grossly normal heart sounds.  Good peripheral circulation. Respiratory: Normal respiratory effort.  No retractions. Lungs CTAB. Gastrointestinal: Soft and nontender. No distention. No abdominal bruits. No CVA tenderness. Genitourinary:  Musculoskeletal: 1+ BLE edema.  Strong pt and dp pulses. No joint effusions. Neurologic:  Normal speech and language. No gross focal neurologic deficits  are appreciated. No facial droop Skin:  Skin is warm, dry and intact. No rash noted. Psychiatric: Mood and affect are normal. Speech and behavior are normal.  ____________________________________________   LABS (all labs ordered are listed, but only abnormal results are displayed)  Results for orders placed or performed during the hospital encounter of 06/05/20 (from the past 24 hour(s))  CBC     Status: Abnormal   Collection Time: 06/05/20  9:54 AM  Result Value Ref Range   WBC 8.6 4.0 - 10.5 K/uL   RBC 5.30 4.22 - 5.81 MIL/uL   Hemoglobin 12.1 (L) 13.0 - 17.0 g/dL   HCT 39.3 39 - 52 %   MCV 74.2 (L) 80.0 - 100.0 fL   MCH 22.8 (L) 26.0 - 34.0 pg   MCHC 30.8 30.0 - 36.0 g/dL   RDW 17.9 (H) 11.5 - 15.5 %   Platelets 306 150 - 400 K/uL   nRBC 0.0 0.0 - 0.2 %  Basic metabolic panel     Status: Abnormal   Collection  Time: 06/05/20  9:54 AM  Result Value Ref Range   Sodium 137 135 - 145 mmol/L   Potassium 4.2 3.5 - 5.1 mmol/L   Chloride 105 98 - 111 mmol/L   CO2 25 22 - 32 mmol/L   Glucose, Bld 121 (H) 70 - 99 mg/dL   BUN 16 8 - 23 mg/dL   Creatinine, Ser 1.21 0.61 - 1.24 mg/dL   Calcium 8.9 8.9 - 10.3 mg/dL   GFR calc non Af Amer >60 >60 mL/min   GFR calc Af Amer >60 >60 mL/min   Anion gap 7 5 - 15  Brain natriuretic peptide     Status: None   Collection Time: 06/05/20  9:54 AM  Result Value Ref Range   B Natriuretic Peptide 48.2 0.0 - 100.0 pg/mL  Hepatic function panel     Status: Abnormal   Collection Time: 06/05/20  9:54 AM  Result Value Ref Range   Total Protein 6.4 (L) 6.5 - 8.1 g/dL   Albumin 3.6 3.5 - 5.0 g/dL   AST 21 15 - 41 U/L   ALT 16 0 - 44 U/L   Alkaline Phosphatase 62 38 - 126 U/L   Total Bilirubin 0.5 0.3 - 1.2 mg/dL   Bilirubin, Direct <0.1 0.0 - 0.2 mg/dL   Indirect Bilirubin NOT CALCULATED 0.3 - 0.9 mg/dL  Urinalysis, Complete w Microscopic     Status: Abnormal   Collection Time: 06/05/20  1:55 PM  Result Value Ref Range   Color, Urine YELLOW (A)  YELLOW   APPearance CLEAR (A) CLEAR   Specific Gravity, Urine 1.018 1.005 - 1.030   pH 5.0 5.0 - 8.0   Glucose, UA NEGATIVE NEGATIVE mg/dL   Hgb urine dipstick NEGATIVE NEGATIVE   Bilirubin Urine NEGATIVE NEGATIVE   Ketones, ur NEGATIVE NEGATIVE mg/dL   Protein, ur NEGATIVE NEGATIVE mg/dL   Nitrite NEGATIVE NEGATIVE   Leukocytes,Ua NEGATIVE NEGATIVE   RBC / HPF 0-5 0 - 5 RBC/hpf   WBC, UA 6-10 0 - 5 WBC/hpf   Bacteria, UA RARE (A) NONE SEEN   Squamous Epithelial / LPF 0-5 0 - 5   Mucus PRESENT    ________________________________________________________________________________________  RADIOLOGY  I personally reviewed all radiographic images ordered to evaluate for the above acute complaints and reviewed radiology reports and findings.  These findings were personally discussed with the patient.  Please see medical record for radiology report.  ____________________________________________   PROCEDURES  Procedure(s) performed:  Procedures    Critical Care performed: no ____________________________________________   INITIAL IMPRESSION / ASSESSMENT AND PLAN / ED COURSE  Pertinent labs & imaging results that were available during my care of the patient were reviewed by me and considered in my medical decision making (see chart for details).   DDX: DVT, anasarca, lymphedema, CHF  Benjamin [name] is a 69 y.o. who presents to the ED with symptoms as described above.  Patient nontoxic-appearing.  Symptoms seem to be more subacute and ongoing over the past several weeks.  Is not complaining of any chest pain or pressure.  I do not see any evidence of pulmonary edema on chest x-ray he is denying any chest pain.  BNP is normal.  He has good peripheral pulses.  Possible component of lymphedema. No evidence of nephrotic syndrome.  Does not appear anasarca,  No signs or symptoms of infectious process.  Will order ultrasound to evaluate for DVT.  Patient will be signed out to oncoming  physician pending results of [place].  Anticipate DC home if negative.     The patient was evaluated in Emergency Department today for the symptoms described in the history of present illness. He/she was evaluated in the context of the global COVID-19 pandemic, which necessitated consideration that the patient might be at risk for infection with the SARS-CoV-2 virus that causes COVID-19. Institutional protocols and algorithms that pertain to the evaluation of patients at risk for COVID-19 are in a state of rapid change based on information released by regulatory bodies including the CDC and federal and state organizations. These policies and algorithms were followed during the patient's care in the ED.  As part of my medical decision making, I reviewed the following data within the [place] notes reviewed and incorporated, Labs reviewed, notes from prior ED visits and [place] Controlled Substance Database   ____________________________________________   FINAL CLINICAL IMPRESSION(S) / ED DIAGNOSES  Final diagnoses:  Leg swelling      NEW MEDICATIONS STARTED DURING THIS VISIT:  New Prescriptions   FUROSEMIDE (LASIX) 20 MG TABLET    Take 1 tablet (20 mg total) by mouth daily.     Note:  This document was prepared using Dragon voice recognition software and may include unintentional dictation errors.    Merlyn Lot, MD 06/05/20 1538

## 2020-07-16 NOTE — Progress Notes (Signed)
[facility]  [address] [place], [place] [address] Phone: [phone]  Fax: [phone]   Clinic Day:  07/17/2020  Referring physician: Gladstone Lighter, MD  Chief Complaint: Benjamin [name] is a 69 y.o. male with iron deficiency anemia who is referred in consultation by Dr. Gladstone Lighter for assessment and management.   HPI: The patient established care with Dr. Tressia Miners on 06/20/2020 after being in the ER for lower extremity edema. Dopplers were negative for DVT. He reported restless legs, which was followed by neurology. He was taking gabapentin prn. He also stated that he had a total knee replacement on 01/26/2020. He had a known history of iron deficiency anemia and could not get into [facility] until 09/2020.  The patient saw Dr. Hillis Range, oncology at [facility], on 04/25/2020 for an elevated PSA (4.02 on 02/09/2020 and 3.25 on 03/15/2020).  He was noted to have lower urinary tract symptoms and an elevated PSA. DRE c/w BPH. Prostate health index obtained to further stratify prostate cancer risk. Of note patient on testosterone for history of hypogonadism which may also elevate PSA in addition to h/o UTI and prostatitis x1 s/p antibiotics. There were no acute concerns at this time. He has a follow-up in 1 year.  CBC followed: 01/20/2018: Hematocrit 47.2, hemoglobin 16.3, MCV 96.0, platelets ----------, WBC 19,000. 05/06/2018: Hematocrit 45.4, hemoglobin 14.8, MCV 79.2, platelets 318,000, WBC   8,500. 04/11/2019: Hematocrit 34,0, hemoglobin 10.2, MCV 80.2, platelets 288,000, WBC   9,600. 01/24/2020: Hematocrit 45.0, hemoglobin 14.4, MCV 85.6, platelets 286,000, WBC   8,200. 06/05/2020: Hematocrit 39.3, hemoglobin 12.1, MCV 74.2, platelets 306,000, WBC   8,600. 06/09/2020: Hematocrit 40.8, hemoglobin 12.5, MCV 76.4, platelets 290,000, WBC   9,400.  Ferritin and iron studies: 02/06/2016: Ferritin 44. 10/06/2018: Ferritin 18. 09/15/2019:  Ferritin 16. Iron saturation   7%. TIBC 375. 01/03/2020: Ferritin 27. Iron saturation 29%. TIBC 339. 02/09/2020: Ferritin 55. Iron saturation   7%. TIBC 328. 06/09/2020: Ferritin 15. Iron saturation   8%. TIBC 404.  Additional labs: 01/15/2018: Sed rate   1.                 PTT 27.2. PT 11.9 and INR 1.0. 01/24/2020: Sed rate   2. CRP 1.2. PTT 28.0. PT 13.8 and INR 1.1. 06/20/2020: Sed rate 23. CRP    3. ANA was negative. B12 was 369.  Symptomatically, he reports urgency, weight gain, depressed mood, and tiredness. He has sleep apnea and uses a CPAP machine. His hands are swollen and get numb. He has arthritis "all over." He has worsening restless legs that keep him up at night. He has tried gabapentin, carbidopa/levodopa, Lyrica, oxycodone, and methadone, none of which have helped. The patient eats greens, liver, chicken, and red meat. He eats meat everyday.  The patient denies fevers, sweats, headaches, changes in vision, runny nose, sore throat, cough, shortness of breath, chest pain, palpitations, ice pica or other cravings, nausea, vomiting, diarrhea, numbness, weakness, balance or coordination problems, blood in the stool, black stool, and hematuria.   The patient took oral iron for several months about a year ago, which did not help. He then received two iron infusions several months apart. The infusions did not help either. This was managed by Dr. Franchot Erichsen at [facility].  He states that his last colonoscopy was in [place] 2-3 years ago. He had an upper endoscopy 25 ago due to black stool. He states that his esophagus had been bleeding due to acid reflux; he  has been on Protonix ever since. He has never had any kind of abdominal surgery.  The patient's father had melanoma and his mother had non-Hodgkin's lymphoma. His aunt had breast cancer. His granddaughter had leukemia at 50 years old.   Past Medical History:  Diagnosis Date  . Arthritis   . Depression   . GERD (gastroesophageal reflux  disease)   . OSA (obstructive sleep apnea)    uses C-pap machine     Past Surgical History:  Procedure Laterality Date  . BACK SURGERY  01/2017 and 01/2018  . colonscopy      x 2  . KNEE ARTHROPLASTY Left 01/26/2020   Procedure: COMPUTER ASSISTED TOTAL KNEE ARTHROPLASTY;  Surgeon: Dereck Leep, MD;  Location: [facility] ORS;  Service: Orthopedics;  Laterality: Left;  . KNEE SURGERY  1991  . none    . TONSILLECTOMY  1964    Family History  Problem Relation Age of Onset  . CAD Neg Hx   . Diabetes Mellitus II Neg Hx     Social History:  reports that he has never smoked. He has never used smokeless tobacco. He reports current alcohol use. He reports that he does not use drugs. The patient used dip and a can lasted him about a week. He has cut down on this. He denies alcohol use. He denies exposure to radiation or toxins. 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 The patient is accompanied by his wife, Benjamin [name], today.  Allergies:  Allergies  Allergen Reactions  . Pramipexole Itching    Current Medications: Current Outpatient Medications  Medication Sig Dispense Refill  . buPROPion (WELLBUTRIN XL) 150 MG 24 hr tablet Take 450 mg by mouth daily.   4  . celecoxib (CELEBREX) 200 MG capsule Take 1 capsule (200 mg total) by mouth 2 (two) times daily. 84 capsule 0  . cyclobenzaprine (FLEXERIL) 10 MG tablet Take by mouth.    . gabapentin (NEURONTIN) 300 MG capsule Take by mouth.    . Gabapentin, Once-Daily, 300 MG TABS Take by mouth.    . oxyCODONE (OXY IR/ROXICODONE) 5 MG immediate release tablet Take 1 tablet (5 mg total) by mouth every 4 (four) hours as needed for moderate pain (pain score 4-6). 30 tablet 0  . oxymetazoline (AFRIN) 0.05 % nasal spray Place 1 spray into both nostrils 2 (two) times daily as needed for congestion.    . pantoprazole (PROTONIX) 40 MG tablet Take 40 mg by mouth daily.  5  . potassium chloride (KLOR-CON) 10 MEQ tablet Take by mouth.    . ropinirole (REQUIP) 5 MG  tablet Take 15 mg by mouth at bedtime.     . tadalafil (CIALIS) 5 MG tablet Take 5 mg by mouth daily as needed for erectile dysfunction.    Marland Kitchen testosterone cypionate (DEPOTESTOSTERONE CYPIONATE) 200 MG/ML injection Inject 200 mg into the muscle every 14 (fourteen) days.  2  . torsemide (DEMADEX) 10 MG tablet Take by mouth.    . traZODone (DESYREL) 50 MG tablet Take 1-2 tablets by mouth at bedtime as needed for sleep.    Marland Kitchen enoxaparin (LOVENOX) 40 MG/0.4ML injection Inject 0.4 mLs (40 mg total) into the skin daily for 14 days. 5.6 mL 0  . FLUoxetine (PROZAC) 20 MG capsule Take 20 mg by mouth every morning. (Patient not taking: Reported on 07/17/2020)  2  . furosemide (LASIX) 20 MG tablet Take 1 tablet (20 mg total) by mouth daily. (Patient not taking: Reported on 07/17/2020) 5 tablet  0  . traMADol (ULTRAM) 50 MG tablet Take 1 tablet (50 mg total) by mouth every 4 (four) hours as needed for moderate pain. (Patient not taking: Reported on 07/17/2020) 30 tablet 0   No current facility-administered medications for this visit.    Review of Systems  Constitutional: Positive for malaise/fatigue. Negative for chills, diaphoresis, fever and weight loss (reports weight gain).  HENT: Negative for congestion, ear discharge, ear pain, hearing loss, nosebleeds, sinus pain, sore throat and tinnitus.   Eyes: Negative for blurred vision.  Respiratory: Negative for cough, hemoptysis, sputum production and shortness of breath.        Sleep apnea, uses CPAP  Cardiovascular: Negative for chest pain, palpitations and leg swelling.  Gastrointestinal: Negative for abdominal pain, blood in stool, constipation, diarrhea, heartburn (on Protonix), melena, nausea and vomiting.       Eats meat daily. Denies ice pica.  Genitourinary: Positive for urgency. Negative for dysuria, frequency and hematuria.  Musculoskeletal: Negative for back pain, joint pain, myalgias and neck pain.       Arthritis "all over".  Skin: Negative for  itching and rash.  Neurological: Negative for dizziness, tingling, sensory change (hand numbness/swelling), weakness and headaches.       Restless legs.  Endo/Heme/Allergies: Does not bruise/bleed easily.  Psychiatric/Behavioral: Positive for depression. Negative for memory loss. The patient is not nervous/anxious and does not have insomnia (due to restless legs).   All other systems reviewed and are negative.  Performance status (ECOG): 1  Vitals Pulse 76, resp. rate 18, weight (!) 314 lb 13.1 oz (142.8 kg), SpO2 97 %.   Physical Exam Vitals and nursing note reviewed.  Constitutional:      General: He is not in acute distress.    Appearance: He is not diaphoretic.  HENT:     Head: Normocephalic and atraumatic.     Comments: Short brown hair.    Mouth/Throat:     Mouth: Mucous membranes are moist.     Pharynx: Oropharynx is clear.  Eyes:     General: No scleral icterus.    Extraocular Movements: Extraocular movements intact.     Conjunctiva/sclera: Conjunctivae normal.     Pupils: Pupils are equal, round, and reactive to light.     Comments: Glasses.  Cardiovascular:     Rate and Rhythm: Normal rate and regular rhythm.     Heart sounds: Normal heart sounds. No murmur heard.   Pulmonary:     Effort: Pulmonary effort is normal. No respiratory distress.     Breath sounds: Normal breath sounds. No wheezing or rales.  Chest:     Chest wall: No tenderness.  Abdominal:     General: Bowel sounds are normal. There is no distension.     Palpations: Abdomen is soft. There is no hepatomegaly, splenomegaly or mass.     Tenderness: There is no abdominal tenderness. There is no guarding or rebound.  Musculoskeletal:        General: No swelling or tenderness. Normal range of motion.     Cervical back: Normal range of motion and neck supple.  Lymphadenopathy:     Head:     Right side of head: No preauricular, posterior auricular or occipital adenopathy.     Left side of head: No  preauricular, posterior auricular or occipital adenopathy.     Cervical: No cervical adenopathy.     Upper Body:     Right upper body: No supraclavicular or axillary adenopathy.     Left upper  body: No supraclavicular or axillary adenopathy.     Lower Body: No right inguinal adenopathy. No left inguinal adenopathy.  Skin:    General: Skin is warm and dry.  Neurological:     Mental Status: He is alert and oriented to person, place, and time.  Psychiatric:        Behavior: Behavior normal.        Thought Content: Thought content normal.        Judgment: Judgment normal.    No visits with results within 3 Day(s) from this visit.  Latest known visit with results is:  Admission on 06/05/2020, Discharged on 06/05/2020  Component Date Value Ref Range Status  . WBC 06/05/2020 8.6  4.0 - 10.5 K/uL Final  . RBC 06/05/2020 5.30  4.22 - 5.81 MIL/uL Final  . Hemoglobin 06/05/2020 12.1* 13.0 - 17.0 g/dL Final  . HCT 06/05/2020 39.3  39 - 52 % Final  . MCV 06/05/2020 74.2* 80.0 - 100.0 fL Final  . MCH 06/05/2020 22.8* 26.0 - 34.0 pg Final  . MCHC 06/05/2020 30.8  30.0 - 36.0 g/dL Final  . RDW 06/05/2020 17.9* 11.5 - 15.5 % Final  . Platelets 06/05/2020 306  150 - 400 K/uL Final  . nRBC 06/05/2020 0.0  0.0 - 0.2 % Final   Performed at [facility], [address], [place], [place] [address]  . Sodium 06/05/2020 137  135 - 145 mmol/L Final  . Potassium 06/05/2020 4.2  3.5 - 5.1 mmol/L Final  . Chloride 06/05/2020 105  98 - 111 mmol/L Final  . CO2 06/05/2020 25  22 - 32 mmol/L Final  . Glucose, Bld 06/05/2020 121* 70 - 99 mg/dL Final   Glucose reference range applies only to samples taken after fasting for at least 8 hours.  . BUN 06/05/2020 16  8 - 23 mg/dL Final  . Creatinine, Ser 06/05/2020 1.21  0.61 - 1.24 mg/dL Final  . Calcium 06/05/2020 8.9  8.9 - 10.3 mg/dL Final  . GFR calc non Af Amer 06/05/2020 >60  >60 mL/min Final  . GFR calc Af Amer 06/05/2020 >60  >60 mL/min Final  .  Anion gap 06/05/2020 7  5 - 15 Final   Performed at [facility], [address] [place], [place] [address]  . B Natriuretic Peptide 06/05/2020 48.2  0.0 - 100.0 pg/mL Final   Performed at [facility], [address] [place], [place] [address]  . Total Protein 06/05/2020 6.4* 6.5 - 8.1 g/dL Final  . Albumin 06/05/2020 3.6  3.5 - 5.0 g/dL Final  . AST 06/05/2020 21  15 - 41 U/L Final  . ALT 06/05/2020 16  0 - 44 U/L Final  . Alkaline Phosphatase 06/05/2020 62  38 - 126 U/L Final  . Total Bilirubin 06/05/2020 0.5  0.3 - 1.2 mg/dL Final  . Bilirubin, Direct 06/05/2020 <0.1  0.0 - 0.2 mg/dL Final  . Indirect Bilirubin 06/05/2020 NOT CALCULATED  0.3 - 0.9 mg/dL Final   Performed at [facility], [address], [place], [place] [address]  . Color, Urine 06/05/2020 YELLOW* YELLOW Final  . APPearance 06/05/2020 CLEAR* CLEAR Final  . Specific Gravity, Urine 06/05/2020 1.018  1.005 - 1.030 Final  . pH 06/05/2020 5.0  5.0 - 8.0 Final  . Glucose, UA 06/05/2020 NEGATIVE  NEGATIVE mg/dL Final  . Hgb urine dipstick 06/05/2020 NEGATIVE  NEGATIVE Final  . Bilirubin Urine 06/05/2020 NEGATIVE  NEGATIVE Final  . Ketones, ur 06/05/2020 NEGATIVE  NEGATIVE mg/dL Final  . Protein, ur 06/05/2020 NEGATIVE  NEGATIVE mg/dL Final  . Nitrite 06/05/2020 NEGATIVE  NEGATIVE Final  . Chalmers Guest 06/05/2020 NEGATIVE  NEGATIVE Final  . RBC / HPF 06/05/2020 0-5  0 - 5 RBC/hpf Final  . WBC, UA 06/05/2020 6-10  0 - 5 WBC/hpf Final  . Bacteria, UA 06/05/2020 RARE* NONE SEEN Final  . Squamous Epithelial / LPF 06/05/2020 0-5  0 - 5 Final  . Mucus 06/05/2020 PRESENT   Final   Performed at [facility], [address] [place], [place] [address]    Assessment:  ADEDAMOLA SETO is a 69 y.o. male with iron deficiency anemia.  He denies any melena, hematochezia, or hematuria.  Diet appear good.  He has taken oral iron and IV iron in the past.  CBC on 06/09/2020 revealed a  hematocrit 40.8, hemoglobin 12.5, MCV 76.4, platelets 290,000, WBC 9,400.  B12 was 369 on 06/20/2020.  Ferritin has been followed: 44 on 02/06/2016, 18 on 10/06/2018, 16 on 09/15/2019, 27 on 01/03/2020, 55 on 02/09/2020 and 15 on 06/09/2020.  Iron saturation has typically been 7-8%.  Colonoscopy in [place] was negative 2-3 years ago. Upper endoscopy 25 ago revealed esophageal bleeding due to acid reflux; he has been on Protonix.  Symptomatically, he notes restless legs. He denies any bleeding.  Exam is unremarkable.  Plan: 1.   Labs today: CBC with diff, ferritin, iron studies, folate, reticulocyte count, urinalysis. 2.   Microcytic RBC indices  Etiology likely secondary to iron deficiency.  Ferritin has changed between 15-55 in the past 4 years.  He has had a trial of oral iron and IV iron.  He notes a history of negative colonoscopy 2 to 3 years ago.  He had an upper endoscopy 25 years ago with apparent esophagitis.  Discuss plans for IV iron (Venofer).   Ferritin goal is 100.   Information provided. 3.   RTC in 1 week for MD assessment, review of work-up and week #1 Venofer.  I discussed the assessment and treatment plan with the patient.  The patient was provided an opportunity to ask questions and all were answered.  The patient agreed with the plan and demonstrated an understanding of the instructions.  The patient was advised to call back if the symptoms worsen or if the condition fails to improve as anticipated.  I provided 32 minutes of face-to-face time during this this encounter and > 50% was spent counseling as documented under my assessment and plan. An additional 5-10 minutes were spent reviewing his chart (Epic and Care Everywhere) including notes, labs, and imaging studies.     Zhara Gieske C. Mike Gip, MD, PhD    07/17/2020, 3:03 PM  I, Mirian Mo Tufford, am acting as Education administrator for [employer]. Mike Gip, MD, PhD.  I, Daylene Vandenbosch C. Mike Gip, MD, have reviewed the above documentation  for accuracy and completeness, and I agree with the above.

## 2020-07-17 ENCOUNTER — Inpatient Hospital Stay: Payer: Medicare Other

## 2020-07-17 ENCOUNTER — Other Ambulatory Visit: Payer: Self-pay

## 2020-07-17 ENCOUNTER — Encounter: Payer: Self-pay | Admitting: Hematology and Oncology

## 2020-07-17 ENCOUNTER — Inpatient Hospital Stay: Payer: Medicare Other | Attending: Hematology and Oncology | Admitting: Hematology and Oncology

## 2020-07-17 VITALS — HR 76 | Resp 18 | Wt 314.8 lb

## 2020-07-17 DIAGNOSIS — G4733 Obstructive sleep apnea (adult) (pediatric): Secondary | ICD-10-CM | POA: Diagnosis not present

## 2020-07-17 DIAGNOSIS — E509 Vitamin A deficiency, unspecified: Secondary | ICD-10-CM

## 2020-07-17 DIAGNOSIS — E611 Iron deficiency: Secondary | ICD-10-CM

## 2020-07-17 DIAGNOSIS — F329 Major depressive disorder, single episode, unspecified: Secondary | ICD-10-CM | POA: Insufficient documentation

## 2020-07-17 DIAGNOSIS — D509 Iron deficiency anemia, unspecified: Secondary | ICD-10-CM | POA: Diagnosis present

## 2020-07-17 DIAGNOSIS — Z79899 Other long term (current) drug therapy: Secondary | ICD-10-CM | POA: Insufficient documentation

## 2020-07-17 DIAGNOSIS — N400 Enlarged prostate without lower urinary tract symptoms: Secondary | ICD-10-CM | POA: Diagnosis not present

## 2020-07-17 DIAGNOSIS — G2581 Restless legs syndrome: Secondary | ICD-10-CM | POA: Diagnosis not present

## 2020-07-17 DIAGNOSIS — Z791 Long term (current) use of non-steroidal anti-inflammatories (NSAID): Secondary | ICD-10-CM | POA: Insufficient documentation

## 2020-07-17 DIAGNOSIS — Z7901 Long term (current) use of anticoagulants: Secondary | ICD-10-CM | POA: Diagnosis not present

## 2020-07-17 DIAGNOSIS — K2100 Gastro-esophageal reflux disease with esophagitis, without bleeding: Secondary | ICD-10-CM | POA: Insufficient documentation

## 2020-07-17 DIAGNOSIS — Z8249 Family history of ischemic heart disease and other diseases of the circulatory system: Secondary | ICD-10-CM | POA: Diagnosis not present

## 2020-07-17 DIAGNOSIS — Z833 Family history of diabetes mellitus: Secondary | ICD-10-CM | POA: Diagnosis not present

## 2020-07-17 LAB — CBC WITH DIFFERENTIAL/PLATELET
Abs Immature Granulocytes: 0.03 10*3/uL (ref 0.00–0.07)
Basophils Absolute: 0 10*3/uL (ref 0.0–0.1)
Basophils Relative: 1 %
Eosinophils Absolute: 0.2 10*3/uL (ref 0.0–0.5)
Eosinophils Relative: 3 %
HCT: 40.5 % (ref 39.0–52.0)
Hemoglobin: 12.7 g/dL — ABNORMAL LOW (ref 13.0–17.0)
Immature Granulocytes: 0 %
Lymphocytes Relative: 24 %
Lymphs Abs: 1.7 10*3/uL (ref 0.7–4.0)
MCH: 23.6 pg — ABNORMAL LOW (ref 26.0–34.0)
MCHC: 31.4 g/dL (ref 30.0–36.0)
MCV: 75.4 fL — ABNORMAL LOW (ref 80.0–100.0)
Monocytes Absolute: 0.6 10*3/uL (ref 0.1–1.0)
Monocytes Relative: 9 %
Neutro Abs: 4.7 10*3/uL (ref 1.7–7.7)
Neutrophils Relative %: 63 %
Platelets: 272 10*3/uL (ref 150–400)
RBC: 5.37 MIL/uL (ref 4.22–5.81)
RDW: 21.1 % — ABNORMAL HIGH (ref 11.5–15.5)
WBC: 7.3 10*3/uL (ref 4.0–10.5)
nRBC: 0 % (ref 0.0–0.2)

## 2020-07-17 LAB — IRON AND TIBC
Iron: 30 ug/dL — ABNORMAL LOW (ref 45–182)
Saturation Ratios: 8 % — ABNORMAL LOW (ref 17.9–39.5)
TIBC: 368 ug/dL (ref 250–450)
UIBC: 338 ug/dL

## 2020-07-17 LAB — URINALYSIS, COMPLETE (UACMP) WITH MICROSCOPIC
Bacteria, UA: NONE SEEN
Bilirubin Urine: NEGATIVE
Glucose, UA: NEGATIVE mg/dL
Hgb urine dipstick: NEGATIVE
Ketones, ur: NEGATIVE mg/dL
Leukocytes,Ua: NEGATIVE
Nitrite: NEGATIVE
Protein, ur: NEGATIVE mg/dL
RBC / HPF: NONE SEEN RBC/hpf (ref 0–5)
Specific Gravity, Urine: 1.025 (ref 1.005–1.030)
pH: 6 (ref 5.0–8.0)

## 2020-07-17 LAB — RETICULOCYTES
Immature Retic Fract: 38.3 % — ABNORMAL HIGH (ref 2.3–15.9)
RBC.: 5.24 MIL/uL (ref 4.22–5.81)
Retic Count, Absolute: 117.4 10*3/uL (ref 19.0–186.0)
Retic Ct Pct: 2.2 % (ref 0.4–3.1)

## 2020-07-17 LAB — TSH: TSH: 2.719 u[IU]/mL (ref 0.350–4.500)

## 2020-07-17 LAB — FERRITIN: Ferritin: 17 ng/mL — ABNORMAL LOW (ref 24–336)

## 2020-07-17 LAB — FOLATE: Folate: 13.2 ng/mL (ref >5.9)

## 2020-07-17 NOTE — Progress Notes (Signed)
Patient here for oncology follow-up appointment, expresses complaints of fatigue, lightheaded, and urinary urgency.

## 2020-07-25 ENCOUNTER — Ambulatory Visit: Payer: BLUE CROSS/BLUE SHIELD

## 2020-07-25 ENCOUNTER — Ambulatory Visit: Payer: BLUE CROSS/BLUE SHIELD | Admitting: Hematology and Oncology

## 2020-07-31 ENCOUNTER — Encounter: Payer: Self-pay | Admitting: Hematology and Oncology

## 2020-07-31 NOTE — Progress Notes (Signed)
[facility]  [address], [address] [place], [place] [address] Phone: [phone]  Fax: [phone]   Clinic Day:  08/01/2020  Referring physician: Gladstone Lighter, MD  Chief Complaint: Benjamin [name] is a 69 y.o. male with iron deficiency anemia who seen for 2 week follow-up and initiation of Venofer.  HPI: The patient was last seen in the hematology clinic on 07/17/2020 for new patient assessment. At that time, he denied any bleeding.  He had restless legs.  He had been on oral and IV iron in the past.  Exam was unremarkable. Hematocrit was 40.5, hemoglobin 12.7, platelets 272,000, WBC 7,300.  Ferritin was 17 (low) with an iron saturation of 8% (low) and a TIBC of 368.  Retic count was 2.2%.  Folate was 13.2. TSH was 2.719. Urinalysis was normal.  During the interim, he has been "okay I guess." His symptoms are unchanged. He would like to begin treatment as soon as possible.   Past Medical History:  Diagnosis Date  . Arthritis   . Depression   . GERD (gastroesophageal reflux disease)   . OSA (obstructive sleep apnea)    uses C-pap machine     Past Surgical History:  Procedure Laterality Date  . BACK SURGERY  01/2017 and 01/2018  . colonscopy      x 2  . KNEE ARTHROPLASTY Left 01/26/2020   Procedure: COMPUTER ASSISTED TOTAL KNEE ARTHROPLASTY;  Surgeon: Benjamin Leep, MD;  Location: [facility] ORS;  Service: Orthopedics;  Laterality: Left;  . KNEE SURGERY  1991  . none    . TONSILLECTOMY  1964    Family History  Problem Relation Age of Onset  . CAD Neg Hx   . Diabetes Mellitus II Neg Hx     Social History:  reports that he has never smoked. He has never used smokeless tobacco. He reports current alcohol use. He reports that he does not use drugs. The patient used dip and a can lasted him about a week. He has cut down on this. He denies alcohol use. He denies exposure to radiation or toxins. 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 The  patient is accompanied by his wife, Benjamin [name], today.  Allergies:  Allergies  Allergen Reactions  . Pramipexole Itching    Current Medications: Current Outpatient Medications  Medication Sig Dispense Refill  . buPROPion (WELLBUTRIN XL) 150 MG 24 hr tablet Take 450 mg by mouth daily.   4  . celecoxib (CELEBREX) 200 MG capsule Take 1 capsule (200 mg total) by mouth 2 (two) times daily. 84 capsule 0  . cyclobenzaprine (FLEXERIL) 10 MG tablet Take by mouth.    . furosemide (LASIX) 20 MG tablet Take 1 tablet (20 mg total) by mouth daily. 5 tablet 0  . gabapentin (NEURONTIN) 300 MG capsule Take by mouth.    . Gabapentin, Once-Daily, 300 MG TABS Take by mouth.    Marland Kitchen oxymetazoline (AFRIN) 0.05 % nasal spray Place 1 spray into both nostrils 2 (two) times daily as needed for congestion.    . pantoprazole (PROTONIX) 40 MG tablet Take 40 mg by mouth daily.  5  . testosterone cypionate (DEPOTESTOSTERONE CYPIONATE) 200 MG/ML injection Inject 200 mg into the muscle every 14 (fourteen) days.  2  . traMADol (ULTRAM) 50 MG tablet Take 1 tablet (50 mg total) by mouth every 4 (four) hours as needed for moderate pain. 30 tablet 0  . traZODone (DESYREL) 50 MG tablet Take 1-2 tablets by mouth at  bedtime as needed for sleep.    Marland Kitchen enoxaparin (LOVENOX) 40 MG/0.4ML injection Inject 0.4 mLs (40 mg total) into the skin daily for 14 days. 5.6 mL 0  . FLUoxetine (PROZAC) 20 MG capsule Take 20 mg by mouth every morning. (Patient not taking: Reported on 07/31/2020)  2  . oxyCODONE (OXY IR/ROXICODONE) 5 MG immediate release tablet Take 1 tablet (5 mg total) by mouth every 4 (four) hours as needed for moderate pain (pain score 4-6). (Patient not taking: Reported on 07/31/2020) 30 tablet 0  . potassium chloride (KLOR-CON) 10 MEQ tablet Take by mouth. (Patient not taking: Reported on 08/01/2020)    . ropinirole (REQUIP) 5 MG tablet Take 15 mg by mouth at bedtime.  (Patient not taking: Reported on 07/31/2020)    . tadalafil (CIALIS) 5 MG  tablet Take 5 mg by mouth daily as needed for erectile dysfunction.    . torsemide (DEMADEX) 10 MG tablet Take by mouth. (Patient not taking: Reported on 08/01/2020)     No current facility-administered medications for this visit.    Review of Systems  Constitutional: Positive for malaise/fatigue and weight loss (2 lbs). Negative for chills, diaphoresis and fever.       Feels "okay I guess."  HENT: Negative for congestion, ear discharge, ear pain, hearing loss, nosebleeds, sinus pain, sore throat and tinnitus.   Eyes: Negative for blurred vision.  Respiratory: Negative for cough, hemoptysis, sputum production and shortness of breath.        Sleep apnea, uses CPAP.  Cardiovascular: Negative for chest pain, palpitations and leg swelling.  Gastrointestinal: Negative for abdominal pain, blood in stool, constipation, diarrhea, heartburn (on Protonix), melena, nausea and vomiting.       Eats meat daily. Denies ice pica.  Genitourinary: Positive for urgency. Negative for dysuria, frequency and hematuria.  Musculoskeletal: Negative for back pain, joint pain, myalgias and neck pain.       Arthritis "all over".  Skin: Negative for itching and rash.  Neurological: Negative for dizziness, tingling, sensory change, weakness and headaches.       Restless legs.  Endo/Heme/Allergies: Does not bruise/bleed easily.  Psychiatric/Behavioral: Positive for depression. Negative for memory loss. The patient is not nervous/anxious and does not have insomnia (due to restless legs).   All other systems reviewed and are negative.  Performance status (ECOG): 1  Vitals Blood pressure 133/78, pulse 82, temperature 97.7 F (36.5 C), temperature source Tympanic, resp. rate 18, height 5\' 9"  (1.753 m), weight (!) 312 lb 8 oz (141.7 kg), SpO2 96 %.   Physical Exam Vitals and nursing note reviewed.  Constitutional:      General: He is not in acute distress.    Appearance: He is not diaphoretic.  HENT:     Head:  Normocephalic and atraumatic.     Comments: Short brown hair. Eyes:     General: No scleral icterus.    Extraocular Movements: Extraocular movements intact.     Conjunctiva/sclera: Conjunctivae normal.     Pupils: Pupils are equal, round, and reactive to light.     Comments: Glasses.  Cardiovascular:     Rate and Rhythm: Normal rate and regular rhythm.     Heart sounds: Normal heart sounds. No murmur heard.   Pulmonary:     Effort: Pulmonary effort is normal. No respiratory distress.     Breath sounds: Normal breath sounds. No wheezing or rales.  Musculoskeletal:        General: No swelling or tenderness. Normal range of  motion.     Cervical back: Normal range of motion and neck supple.  Skin:    General: Skin is warm and dry.  Neurological:     Mental Status: He is alert and oriented to person, place, and time.  Psychiatric:        Thought Content: Thought content normal.        Judgment: Judgment normal.    No visits with results within 3 Day(s) from this visit.  Latest known visit with results is:  Appointment on 07/17/2020  Component Date Value Ref Range Status  . TSH 07/17/2020 2.719  0.350 - 4.500 uIU/mL Final   Comment: Performed by a 3rd Generation assay with a functional sensitivity of <=0.01 uIU/mL. Performed at [facility], [address] [place], [place] [address]   . Retic Ct Pct 07/17/2020 2.2  0.4 - 3.1 % Final  . RBC. 07/17/2020 5.24  4.22 - 5.81 MIL/uL Final  . Retic Count, Absolute 07/17/2020 117.4  19.0 - 186.0 K/uL Final  . Immature Retic Fract 07/17/2020 38.3* 2.3 - 15.9 % Final   Performed at [facility], [address], [place], [name] [address]  . Folate 07/17/2020 13.2  >5.9 ng/mL Final   Performed at [facility], [address] [place], [place] [address]  . Iron 07/17/2020 30* 45 - 182 ug/dL Final  . TIBC 07/17/2020 368  250 - 450 ug/dL Final  . Saturation Ratios 07/17/2020 8* 17.9 - 39.5 % Final  . UIBC  07/17/2020 338  ug/dL Final   Performed at [facility], [address] [place], [place] [address]  . Ferritin 07/17/2020 17* 24 - 336 ng/mL Final   Performed at [facility], [address] [place], [place] [address]  . WBC 07/17/2020 7.3  4.0 - 10.5 K/uL Final  . RBC 07/17/2020 5.37  4.22 - 5.81 MIL/uL Final  . Hemoglobin 07/17/2020 12.7* 13.0 - 17.0 g/dL Final  . HCT 07/17/2020 40.5  39 - 52 % Final  . MCV 07/17/2020 75.4* 80.0 - 100.0 fL Final  . MCH 07/17/2020 23.6* 26.0 - 34.0 pg Final  . MCHC 07/17/2020 31.4  30.0 - 36.0 g/dL Final  . RDW 07/17/2020 21.1* 11.5 - 15.5 % Final  . Platelets 07/17/2020 272  150 - 400 K/uL Final  . nRBC 07/17/2020 0.0  0.0 - 0.2 % Final  . Neutrophils Relative % 07/17/2020 63  % Final  . Neutro Abs 07/17/2020 4.7  1.7 - 7.7 K/uL Final  . Lymphocytes Relative 07/17/2020 24  % Final  . Lymphs Abs 07/17/2020 1.7  0.7 - 4.0 K/uL Final  . Monocytes Relative 07/17/2020 9  % Final  . Monocytes Absolute 07/17/2020 0.6  0 - 1 K/uL Final  . Eosinophils Relative 07/17/2020 3  % Final  . Eosinophils Absolute 07/17/2020 0.2  0 - 0 K/uL Final  . Basophils Relative 07/17/2020 1  % Final  . Basophils Absolute 07/17/2020 0.0  0 - 0 K/uL Final  . Immature Granulocytes 07/17/2020 0  % Final  . Abs Immature Granulocytes 07/17/2020 0.03  0.00 - 0.07 K/uL Final   Performed at [facility], [address], [place], [place] [address]  . Color, Urine 07/17/2020 YELLOW  YELLOW Final  . APPearance 07/17/2020 CLEAR  CLEAR Final  . Specific Gravity, Urine 07/17/2020 1.025  1.005 - 1.030 Final  . pH 07/17/2020 6.0  5.0 - 8.0 Final  . Glucose, UA 07/17/2020 NEGATIVE  NEGATIVE mg/dL  Final  . Hgb urine dipstick 07/17/2020 NEGATIVE  NEGATIVE Final  . Bilirubin Urine 07/17/2020 NEGATIVE  NEGATIVE Final  . Ketones, ur 07/17/2020 NEGATIVE  NEGATIVE mg/dL Final  . Protein, ur 07/17/2020 NEGATIVE  NEGATIVE mg/dL Final  . Nitrite 07/17/2020 NEGATIVE   NEGATIVE Final  . Chalmers Guest 07/17/2020 NEGATIVE  NEGATIVE Final  . Squamous Epithelial / LPF 07/17/2020 0-5  0 - 5 Final  . WBC, UA 07/17/2020 0-5  0 - 5 WBC/hpf Final  . RBC / HPF 07/17/2020 NONE SEEN  0 - 5 RBC/hpf Final  . Bacteria, UA 07/17/2020 NONE SEEN  NONE SEEN Final   Performed at [facility], [address] [place], [place] [address]    Assessment:  Benjamin [name] is a 69 y.o. male with iron deficiency anemia.  He denies any melena, hematochezia, or hematuria.  Diet appear good.  He has taken oral iron and IV iron in the past.  CBC on 06/09/2020 revealed a hematocrit 40.8, hemoglobin 12.5, MCV 76.4, platelets 290,000, WBC 9,400.  B12 was 369 on 06/20/2020.  Labs on 07/17/2020 revealed a hematocrit of 40.5, hemoglobin 12.7, platelets 272,000, WBC 7,300.  Ferritin was 17 (low) with an iron saturation of 8% (low) and a TIBC of 368.  Retic count was 2.2%.  Folate was 13.2. TSH was 2.719. Urinalysis was normal.  Ferritin has been followed: 44 on 02/06/2016, 18 on 10/06/2018, 16 on 09/15/2019, 27 on 01/03/2020, 55 on 02/09/2020, 15 on 06/09/2020, and 17 on 07/17/2020.  Iron saturation has typically been 7-8%.  Colonoscopy in [place] was negative 2-3 years ago. Upper endoscopy 25 ago revealed esophageal bleeding due to acid reflux; he has been on Protonix.  Symptomatically, he is fatigued.  He denies any bleeding.  Exam is stable.  Plan: 1.   Review labs from 07/17/2020. 2.   Iron deficiency  Hematocrit 40.5.  Hemoglobin 12.7.  MCV 75.4 on 07/17/2020.   Ferritin 17 with an iron saturation of 8% and a TIBC of 368.  He recently been on a trial of oral iron and IV iron.  Colonoscopy 2-3 years ago in [place] was negative (no report available).  He had an upper endoscopy 25 years ago with apparent esophagitis.  Review plans for Venofer.  Patient consented.  Ferritin goal 100. 3.   Venofer today and weekly x 2 (total 3). 4.   RTC in 6 weeks for labs (CBC,  ferritin, iron studies- day before) and +/- Venofer.  I discussed the assessment and treatment plan with the patient.  The patient was provided an opportunity to ask questions and all were answered.  The patient agreed with the plan and demonstrated an understanding of the instructions.  The patient was advised to call back if the symptoms worsen or if the condition fails to improve as anticipated.   Melissa C. Mike Gip, MD, PhD    08/01/2020, 1:43 PM  I, Mirian Mo Tufford, am acting as Education administrator for [employer]. Mike Gip, MD, PhD.  I, Melissa C. Mike Gip, MD, have reviewed the above documentation for accuracy and completeness, and I agree with the above.

## 2020-07-31 NOTE — Progress Notes (Signed)
Patient called/ screnned for oncology follow-up appointment, expresses  complaints of generalized pain and tingling per wife.

## 2020-08-01 ENCOUNTER — Inpatient Hospital Stay: Payer: Medicare Other | Attending: Hematology and Oncology | Admitting: Hematology and Oncology

## 2020-08-01 ENCOUNTER — Other Ambulatory Visit: Payer: Self-pay

## 2020-08-01 ENCOUNTER — Encounter: Payer: Self-pay | Admitting: Hematology and Oncology

## 2020-08-01 ENCOUNTER — Inpatient Hospital Stay: Payer: Medicare Other

## 2020-08-01 VITALS — BP 114/72 | HR 78 | Resp 18

## 2020-08-01 VITALS — BP 133/78 | HR 82 | Temp 97.7°F | Resp 18 | Ht 69.0 in | Wt 312.5 lb

## 2020-08-01 DIAGNOSIS — F329 Major depressive disorder, single episode, unspecified: Secondary | ICD-10-CM | POA: Diagnosis not present

## 2020-08-01 DIAGNOSIS — Z79899 Other long term (current) drug therapy: Secondary | ICD-10-CM | POA: Diagnosis not present

## 2020-08-01 DIAGNOSIS — E611 Iron deficiency: Secondary | ICD-10-CM

## 2020-08-01 DIAGNOSIS — G4733 Obstructive sleep apnea (adult) (pediatric): Secondary | ICD-10-CM | POA: Diagnosis not present

## 2020-08-01 DIAGNOSIS — D509 Iron deficiency anemia, unspecified: Secondary | ICD-10-CM | POA: Insufficient documentation

## 2020-08-01 DIAGNOSIS — K219 Gastro-esophageal reflux disease without esophagitis: Secondary | ICD-10-CM | POA: Diagnosis not present

## 2020-08-01 MED ORDER — IRON SUCROSE 20 MG/ML IV SOLN
200.0000 mg | Freq: Once | INTRAVENOUS | Status: AC
  Administered 2020-08-01: 200 mg via INTRAVENOUS
  Filled 2020-08-01: qty 10

## 2020-08-01 MED ORDER — SODIUM CHLORIDE 0.9 % IV SOLN: 200.0000 mg | Freq: Once | INTRAVENOUS | Status: DC

## 2020-08-01 MED ORDER — SODIUM CHLORIDE 0.9 % IV SOLN
Freq: Once | INTRAVENOUS | Status: AC
  Filled 2020-08-01: qty 250

## 2020-08-01 NOTE — Progress Notes (Signed)
No new changes noted today 

## 2020-08-08 ENCOUNTER — Inpatient Hospital Stay: Payer: Medicare Other

## 2020-08-08 ENCOUNTER — Other Ambulatory Visit: Payer: Self-pay

## 2020-08-08 VITALS — BP 116/67 | HR 78 | Temp 97.6°F | Resp 18

## 2020-08-08 DIAGNOSIS — D509 Iron deficiency anemia, unspecified: Secondary | ICD-10-CM | POA: Diagnosis not present

## 2020-08-08 DIAGNOSIS — E611 Iron deficiency: Secondary | ICD-10-CM

## 2020-08-08 MED ORDER — SODIUM CHLORIDE 0.9 % IV SOLN: 200.0000 mg | Freq: Once | INTRAVENOUS | Status: DC

## 2020-08-08 MED ORDER — SODIUM CHLORIDE 0.9 % IV SOLN
Freq: Once | INTRAVENOUS | Status: AC
  Filled 2020-08-08: qty 250

## 2020-08-08 MED ORDER — IRON SUCROSE 20 MG/ML IV SOLN
200.0000 mg | Freq: Once | INTRAVENOUS | Status: AC
  Administered 2020-08-08: 200 mg via INTRAVENOUS
  Filled 2020-08-08: qty 10

## 2020-08-14 ENCOUNTER — Other Ambulatory Visit: Payer: Self-pay

## 2020-08-14 ENCOUNTER — Inpatient Hospital Stay: Payer: Medicare Other

## 2020-08-14 VITALS — BP 119/77 | HR 81 | Temp 98.5°F | Resp 18

## 2020-08-14 DIAGNOSIS — E611 Iron deficiency: Secondary | ICD-10-CM

## 2020-08-14 DIAGNOSIS — D509 Iron deficiency anemia, unspecified: Secondary | ICD-10-CM | POA: Diagnosis not present

## 2020-08-14 MED ORDER — SODIUM CHLORIDE 0.9 % IV SOLN: 200.0000 mg | Freq: Once | INTRAVENOUS | Status: DC

## 2020-08-14 MED ORDER — SODIUM CHLORIDE 0.9 % IV SOLN
Freq: Once | INTRAVENOUS | Status: AC
  Filled 2020-08-14: qty 250

## 2020-08-14 MED ORDER — IRON SUCROSE 20 MG/ML IV SOLN
200.0000 mg | Freq: Once | INTRAVENOUS | Status: AC
  Administered 2020-08-14: 200 mg via INTRAVENOUS
  Filled 2020-08-14: qty 10

## 2020-08-15 MED ORDER — HEPARIN SOD (PORK) LOCK FLUSH 100 UNIT/ML IV SOLN
INTRAVENOUS | Status: AC
  Filled 2020-08-15: qty 5

## 2020-08-25 ENCOUNTER — Other Ambulatory Visit: Payer: Self-pay

## 2020-08-25 ENCOUNTER — Ambulatory Visit (INDEPENDENT_AMBULATORY_CARE_PROVIDER_SITE_OTHER): Payer: Medicare Other | Admitting: Vascular Surgery

## 2020-08-25 ENCOUNTER — Encounter (INDEPENDENT_AMBULATORY_CARE_PROVIDER_SITE_OTHER): Payer: Self-pay | Admitting: Vascular Surgery

## 2020-08-25 VITALS — BP 137/71 | HR 87 | Ht 68.0 in | Wt 318.0 lb

## 2020-08-25 DIAGNOSIS — E782 Mixed hyperlipidemia: Secondary | ICD-10-CM

## 2020-08-25 DIAGNOSIS — Z6841 Body Mass Index (BMI) 40.0 and over, adult: Secondary | ICD-10-CM

## 2020-08-25 DIAGNOSIS — M7989 Other specified soft tissue disorders: Secondary | ICD-10-CM | POA: Diagnosis not present

## 2020-08-25 DIAGNOSIS — M170 Bilateral primary osteoarthritis of knee: Secondary | ICD-10-CM

## 2020-08-25 NOTE — Assessment & Plan Note (Signed)
Significant contributing to his lower extremity pain and also caused some swelling.  Follows with orthopedics.

## 2020-08-25 NOTE — Assessment & Plan Note (Signed)
lipid control important in reducing the progression of atherosclerotic disease. Continue statin therapy  

## 2020-08-25 NOTE — Assessment & Plan Note (Signed)

## 2020-08-25 NOTE — Patient Instructions (Signed)
Edema  Edema is when you have too much fluid in your body or under your skin. Edema may make your legs, feet, and ankles swell up. Swelling is also common in looser tissues, like around your eyes. This is a common condition. It gets more common as you get older. There are many possible causes of edema. Eating too much salt (sodium) and being on your feet or sitting for a long time can cause edema in your legs, feet, and ankles. Hot weather may make edema worse. Edema is usually painless. Your skin may look swollen or shiny. Follow these instructions at home:  Keep the swollen body part raised (elevated) above the level of your heart when you are sitting or lying down.  Do not sit still or stand for a long time.  Do not wear tight clothes. Do not wear garters on your upper legs.  Exercise your legs. This can help the swelling go down.  Wear elastic bandages or support stockings as told by your doctor.  Eat a low-salt (low-sodium) diet to reduce fluid as told by your doctor.  Depending on the cause of your swelling, you may need to limit how much fluid you drink (fluid restriction).  Take over-the-counter and prescription medicines only as told by your doctor. Contact a doctor if:  Treatment is not working.  You have heart, liver, or kidney disease and have symptoms of edema.  You have sudden and unexplained weight gain. Get help right away if:  You have shortness of breath or chest pain.  You cannot breathe when you lie down.  You have pain, redness, or warmth in the swollen areas.  You have heart, liver, or kidney disease and get edema all of a sudden.  You have a fever and your symptoms get worse all of a sudden. Summary  Edema is when you have too much fluid in your body or under your skin.  Edema may make your legs, feet, and ankles swell up. Swelling is also common in looser tissues, like around your eyes.  Raise (elevate) the swollen body part above the level of your  heart when you are sitting or lying down.  Follow your doctor's instructions about diet and how much fluid you can drink (fluid restriction). This information is not intended to replace advice given to you by your health care provider. Make sure you discuss any questions you have with your health care provider. Document Revised: 11/07/2017 Document Reviewed: 11/22/2016 Elsevier Patient Education  2020 [employer]  

## 2020-08-25 NOTE — Progress Notes (Signed)
Patient ID: Benjamin [name], male   DOB: 27-May-1951, 69 y.o.   MRN: [id number]  Chief Complaint  Patient presents with  . New Patient (Initial Visit)    LE edema    HPI Benjamin [name] is a 69 y.o. male.  I am asked to see the patient by Dr. Clayborn Bigness for evaluation of leg swelling.  Several weeks ago, the patient had marked swelling in both legs that came up abruptly without a clear cause or inciting event.  He was seen by his cardiologist who started him on some fluid pills which did improve the swelling some.  He still has some mild swelling in both lower extremities.  He complains of a lot of restless leg syndrome and significant arthritis in his knees.  He is already had a left knee replacement.  He needed a right knee replacement but it has been put on hold by Covid.  He also has a lot of pain and swelling in his hands.  He is strong radial and ulnar pulses, and his hand symptoms sound like carpal tunnel type symptoms.  He had ABIs done by his cardiologist which were normal and on those the brachial pressures look good as well.  He also had a negative DVT study.   Past Medical History:  Diagnosis Date  . Arthritis   . Depression   . GERD (gastroesophageal reflux disease)   . OSA (obstructive sleep apnea)    uses C-pap machine     Past Surgical History:  Procedure Laterality Date  . BACK SURGERY  01/2017 and 01/2018  . colonscopy      x 2  . KNEE ARTHROPLASTY Left 01/26/2020   Procedure: COMPUTER ASSISTED TOTAL KNEE ARTHROPLASTY;  Surgeon: Dereck Leep, MD;  Location: [facility] ORS;  Service: Orthopedics;  Laterality: Left;  . KNEE SURGERY  1991  . none    . TONSILLECTOMY  1964     Family History  Problem Relation Age of Onset  . CAD Neg Hx   . Diabetes Mellitus II Neg Hx   No bleeding disorders No aneurysms. No clotting disorders   Social History   Tobacco Use  . Smoking status: Never Smoker  . Smokeless tobacco: Never Used  Vaping Use  . Vaping Use: Never  used  Substance Use Topics  . Alcohol use: Yes  . Drug use: No     Allergies  Allergen Reactions  . Pramipexole Itching    Current Outpatient Medications  Medication Sig Dispense Refill  . buPROPion (WELLBUTRIN XL) 150 MG 24 hr tablet Take 450 mg by mouth daily.   4  . celecoxib (CELEBREX) 200 MG capsule Take 1 capsule (200 mg total) by mouth 2 (two) times daily. 84 capsule 0  . cyclobenzaprine (FLEXERIL) 10 MG tablet Take by mouth.    Marland Kitchen FLUoxetine (PROZAC) 20 MG capsule Take 20 mg by mouth every morning.   2  . furosemide (LASIX) 20 MG tablet Take 1 tablet (20 mg total) by mouth daily. 5 tablet 0  . gabapentin (NEURONTIN) 300 MG capsule Take by mouth.    . Gabapentin, Once-Daily, 300 MG TABS Take by mouth.    . oxyCODONE (OXY IR/ROXICODONE) 5 MG immediate release tablet Take 1 tablet (5 mg total) by mouth every 4 (four) hours as needed for moderate pain (pain score 4-6). 30 tablet 0  . oxymetazoline (AFRIN) 0.05 % nasal spray Place 1 spray into both nostrils 2 (two) times daily as needed for congestion.    Marland Kitchen  pantoprazole (PROTONIX) 40 MG tablet Take 40 mg by mouth daily.  5  . potassium chloride (KLOR-CON) 10 MEQ tablet Take by mouth.     . ropinirole (REQUIP) 5 MG tablet Take 15 mg by mouth at bedtime.     . tadalafil (CIALIS) 5 MG tablet Take 5 mg by mouth daily as needed for erectile dysfunction.    Marland Kitchen testosterone cypionate (DEPOTESTOSTERONE CYPIONATE) 200 MG/ML injection Inject 200 mg into the muscle every 14 (fourteen) days.  2  . torsemide (DEMADEX) 10 MG tablet Take by mouth.     . traMADol (ULTRAM) 50 MG tablet Take 1 tablet (50 mg total) by mouth every 4 (four) hours as needed for moderate pain. 30 tablet 0  . traZODone (DESYREL) 50 MG tablet Take 1-2 tablets by mouth at bedtime as needed for sleep.    Marland Kitchen enoxaparin (LOVENOX) 40 MG/0.4ML injection Inject 0.4 mLs (40 mg total) into the skin daily for 14 days. 5.6 mL 0   No current facility-administered medications for this  visit.      REVIEW OF SYSTEMS (Negative unless checked)  Constitutional: [] Weight loss  [] Fever  [] Chills Cardiac: [] Chest pain   [] Chest pressure   [] Palpitations   [] Shortness of breath when laying flat   [] Shortness of breath at rest   [] Shortness of breath with exertion. Vascular:  [] Pain in legs with walking   [] Pain in legs at rest   [] Pain in legs when laying flat   [] Claudication   [] Pain in feet when walking  [] Pain in feet at rest  [] Pain in feet when laying flat   [] History of DVT   [] Phlebitis   [x] Swelling in legs   [] Varicose veins   [] Non-healing ulcers Pulmonary:   [] Uses home oxygen   [] Productive cough   [] Hemoptysis   [] Wheeze  [] COPD   [] Asthma Neurologic:  [] Dizziness  [] Blackouts   [] Seizures   [] History of stroke   [] History of TIA  [] Aphasia   [] Temporary blindness   [] Dysphagia   [] Weakness or numbness in arms   [] Weakness or numbness in legs Musculoskeletal:  [x] Arthritis   [] Joint swelling   [x] Joint pain   [] Low back pain Hematologic:  [] Easy bruising  [] Easy bleeding   [] Hypercoagulable state   [] Anemic  [] Hepatitis Gastrointestinal:  [] Blood in stool   [] Vomiting blood  [x] Gastroesophageal reflux/heartburn   [] Abdominal pain Genitourinary:  [] Chronic kidney disease   [] Difficult urination  [] Frequent urination  [] Burning with urination   [] Hematuria Skin:  [] Rashes   [] Ulcers   [] Wounds Psychological:  [] History of anxiety   [x]  History of major depression.    Physical Exam BP 137/71   Pulse 87   Ht 5\' 8"  (1.727 m)   Wt (!) 318 lb (144.2 kg)   BMI 48.35 kg/m  Gen:  WD/WN, NAD.  Morbidly obese Head: [place]/AT, No temporalis wasting. Ear/Nose/Throat: Hearing grossly intact, nares w/o erythema or drainage, oropharynx w/o Erythema/Exudate Eyes: Conjunctiva clear, sclera non-icteric  Neck: trachea midline.  No JVD.  Pulmonary:  Good air movement, respirations not labored, no use of accessory muscles  Cardiac: Irregular Vascular:  Vessel Right Left  Radial  Palpable Palpable  Ulnar  palpable  palpable                      DP  palpable  palpable  PT 1+ 1+   Gastrointestinal:. No masses, surgical incisions, or scars. Musculoskeletal: M/S 5/5 throughout.  Extremities without ischemic changes.  No deformity or atrophy.  Mild bilateral lower extremity edema. Neurologic: Sensation grossly intact in extremities.  Symmetrical.  Speech is fluent. Motor exam as listed above. Psychiatric: Judgment intact, Mood & affect appropriate for pt's clinical situation. Dermatologic: No rashes or ulcers noted.  No cellulitis or open wounds.    Radiology No results found.  Labs Recent Results (from the past 2160 hour(s))  CBC     Status: Abnormal   Collection Time: 06/05/20  9:54 AM  Result Value Ref Range   WBC 8.6 4.0 - 10.5 K/uL   RBC 5.30 4.22 - 5.81 MIL/uL   Hemoglobin 12.1 (L) 13.0 - 17.0 g/dL   HCT 39.3 39 - 52 %   MCV 74.2 (L) 80.0 - 100.0 fL   MCH 22.8 (L) 26.0 - 34.0 pg   MCHC 30.8 30.0 - 36.0 g/dL   RDW 17.9 (H) 11.5 - 15.5 %   Platelets 306 150 - 400 K/uL   nRBC 0.0 0.0 - 0.2 %    Comment: Performed at [facility], [address], [place], [place] [address]  Basic metabolic panel     Status: Abnormal   Collection Time: 06/05/20  9:54 AM  Result Value Ref Range   Sodium 137 135 - 145 mmol/L   Potassium 4.2 3.5 - 5.1 mmol/L   Chloride 105 98 - 111 mmol/L   CO2 25 22 - 32 mmol/L   Glucose, Bld 121 (H) 70 - 99 mg/dL    Comment: Glucose reference range applies only to samples taken after fasting for at least 8 hours.   BUN 16 8 - 23 mg/dL   Creatinine, Ser 1.21 0.61 - 1.24 mg/dL   Calcium 8.9 8.9 - 10.3 mg/dL   GFR calc non Af Amer >60 >60 mL/min   GFR calc Af Amer >60 >60 mL/min   Anion gap 7 5 - 15    Comment: Performed at [facility], [address] [place], [place] [address]  Brain natriuretic peptide     Status: None   Collection Time: 06/05/20  9:54 AM  Result Value Ref Range   B Natriuretic Peptide  48.2 0.0 - 100.0 pg/mL    Comment: Performed at [facility], [address] [place], [place] [address]  Hepatic function panel     Status: Abnormal   Collection Time: 06/05/20  9:54 AM  Result Value Ref Range   Total Protein 6.4 (L) 6.5 - 8.1 g/dL   Albumin 3.6 3.5 - 5.0 g/dL   AST 21 15 - 41 U/L   ALT 16 0 - 44 U/L   Alkaline Phosphatase 62 38 - 126 U/L   Total Bilirubin 0.5 0.3 - 1.2 mg/dL   Bilirubin, Direct <0.1 0.0 - 0.2 mg/dL   Indirect Bilirubin NOT CALCULATED 0.3 - 0.9 mg/dL    Comment: Performed at [facility], [address] [place], [place] [address]  Urinalysis, Complete w Microscopic     Status: Abnormal   Collection Time: 06/05/20  1:55 PM  Result Value Ref Range   Color, Urine YELLOW (A) YELLOW   APPearance CLEAR (A) CLEAR   Specific Gravity, Urine 1.018 1.005 - 1.030   pH 5.0 5.0 - 8.0   Glucose, UA NEGATIVE NEGATIVE mg/dL   Hgb urine dipstick NEGATIVE NEGATIVE   Bilirubin Urine NEGATIVE NEGATIVE   Ketones, ur NEGATIVE NEGATIVE mg/dL   Protein, ur NEGATIVE NEGATIVE mg/dL   Nitrite NEGATIVE NEGATIVE   Leukocytes,Ua NEGATIVE NEGATIVE   RBC / HPF 0-5 0 - 5  RBC/hpf   WBC, UA 6-10 0 - 5 WBC/hpf   Bacteria, UA RARE (A) NONE SEEN   Squamous Epithelial / LPF 0-5 0 - 5   Mucus PRESENT     Comment: Performed at [facility], [address] [place], [place] [address]  TSH     Status: None   Collection Time: 07/17/20  3:46 PM  Result Value Ref Range   TSH 2.719 0.350 - 4.500 uIU/mL    Comment: Performed by a 3rd Generation assay with a functional sensitivity of <=0.01 uIU/mL. Performed at [facility], [address] [place], [place] [address]   Reticulocytes     Status: Abnormal   Collection Time: 07/17/20  3:46 PM  Result Value Ref Range   Retic Ct Pct 2.2 0.4 - 3.1 %   RBC. 5.24 4.22 - 5.81 MIL/uL   Retic Count, Absolute 117.4 19.0 - 186.0 K/uL   Immature Retic Fract 38.3 (H) 2.3 - 15.9 %    Comment: Performed at  [facility], [address] [place], [place] [address]  Folate     Status: None   Collection Time: 07/17/20  3:46 PM  Result Value Ref Range   Folate 13.2 >5.9 ng/mL    Comment: Performed at [facility], [address] [place], [place] [address]  Iron and TIBC     Status: Abnormal   Collection Time: 07/17/20  3:46 PM  Result Value Ref Range   Iron 30 (L) 45 - 182 ug/dL   TIBC 368 250 - 450 ug/dL   Saturation Ratios 8 (L) 17.9 - 39.5 %   UIBC 338 ug/dL    Comment: Performed at [facility], [address] [place], [place] [address]  Ferritin     Status: Abnormal   Collection Time: 07/17/20  3:46 PM  Result Value Ref Range   Ferritin 17 (L) 24 - 336 ng/mL    Comment: Performed at [facility], [address] [place], [place] [address]  CBC with Differential/Platelet     Status: Abnormal   Collection Time: 07/17/20  3:46 PM  Result Value Ref Range   WBC 7.3 4.0 - 10.5 K/uL   RBC 5.37 4.22 - 5.81 MIL/uL   Hemoglobin 12.7 (L) 13.0 - 17.0 g/dL   HCT 40.5 39 - 52 %   MCV 75.4 (L) 80.0 - 100.0 fL   MCH 23.6 (L) 26.0 - 34.0 pg   MCHC 31.4 30.0 - 36.0 g/dL   RDW 21.1 (H) 11.5 - 15.5 %   Platelets 272 150 - 400 K/uL   nRBC 0.0 0.0 - 0.2 %   Neutrophils Relative % 63 %   Neutro Abs 4.7 1.7 - 7.7 K/uL   Lymphocytes Relative 24 %   Lymphs Abs 1.7 0.7 - 4.0 K/uL   Monocytes Relative 9 %   Monocytes Absolute 0.6 0.1 - 1.0 K/uL   Eosinophils Relative 3 %   Eosinophils Absolute 0.2 0 - 0 K/uL   Basophils Relative 1 %   Basophils Absolute 0.0 0 - 0 K/uL   Immature Granulocytes 0 %   Abs Immature Granulocytes 0.03 0.00 - 0.07 K/uL    Comment: Performed at [facility], [address] [place],  [address]  Urinalysis, Complete w Microscopic     Status: None   Collection Time: 07/17/20  3:55 PM  Result Value Ref Range   Color, Urine YELLOW YELLOW   APPearance CLEAR CLEAR   Specific Gravity,  Urine 1.025 1.005 - 1.030   pH  6.0 5.0 - 8.0   Glucose, UA NEGATIVE NEGATIVE mg/dL   Hgb urine dipstick NEGATIVE NEGATIVE   Bilirubin Urine NEGATIVE NEGATIVE   Ketones, ur NEGATIVE NEGATIVE mg/dL   Protein, ur NEGATIVE NEGATIVE mg/dL   Nitrite NEGATIVE NEGATIVE   Leukocytes,Ua NEGATIVE NEGATIVE   Squamous Epithelial / LPF 0-5 0 - 5   WBC, UA 0-5 0 - 5 WBC/hpf   RBC / HPF NONE SEEN 0 - 5 RBC/hpf   Bacteria, UA NONE SEEN NONE SEEN    Comment: Performed at [facility], [address], [place], [place] [address]    Assessment/Plan:  Primary osteoarthritis of both knees Significant contributing to his lower extremity pain and also caused some swelling.  Follows with orthopedics.  Morbid obesity with BMI of 40.0-44.9, adult (HCC) Weight loss would be of significant benefit in terms of improving his lower extremity swelling.  Mixed hyperlipidemia lipid control important in reducing the progression of atherosclerotic disease. Continue statin therapy   Swelling of limb I have had a long discussion with the patient regarding swelling and why it  causes symptoms.  Patient will begin wearing graduated compression stockings class 1 (20-30 mmHg) on a daily basis a prescription was given. The patient will  beginning wearing the stockings first thing in the morning and removing them in the evening. The patient is instructed specifically not to sleep in the stockings.   In addition, behavioral modification will be initiated.  This will include frequent elevation, use of over the counter pain medications and exercise such as walking.  I have reviewed systemic causes for chronic edema such as liver, kidney and cardiac etiologies.  The patient denies problems with these organ systems.    Consideration for a lymph pump will also be made based upon the effectiveness of conservative therapy.  This would help to improve the edema control and prevent sequela such as ulcers and infections   Patient should undergo duplex  ultrasound of the venous system to ensure that DVT or reflux is not present.  The patient will follow-up with me after the ultrasound.        Leotis Pain 08/25/2020, 10:06 AM   This note was created with Dragon medical transcription system.  Any errors from dictation are unintentional.

## 2020-08-25 NOTE — Assessment & Plan Note (Signed)
Weight loss would be of significant benefit in terms of improving his lower extremity swelling.

## 2020-08-28 NOTE — Discharge Instructions (Signed)
Instructions after Total Knee Replacement   Benjamin [name] Benjamin [name], Jr., M.D.     Dept. of Orthopaedics & Sports Medicine  [facility]  [address]  [place], [place]  [address]  Phone: [phone]   Fax: [phone]    DIET: Drink plenty of non-alcoholic fluids. Resume your normal diet. Include foods high in fiber.  ACTIVITY:  You may use crutches or a walker with weight-bearing as tolerated, unless instructed otherwise. You may be weaned off of the walker or crutches by your Physical Therapist.  Do NOT place pillows under the knee. Anything placed under the knee could limit your ability to straighten the knee.   Continue doing gentle exercises. Exercising will reduce the pain and swelling, increase motion, and prevent muscle weakness.   Please continue to use the TED compression stockings for 6 weeks. You may remove the stockings at night, but should reapply them in the morning. Do not drive or operate any equipment until instructed.  WOUND CARE:  Continue to use the PolarCare or ice packs periodically to reduce pain and swelling. You may bathe or shower after the staples are removed at the first office visit following surgery.  MEDICATIONS: You may resume your regular medications. Please take the pain medication as prescribed on the medication. Do not take pain medication on an empty stomach. You have been given a prescription for a blood thinner (Lovenox or Coumadin). Please take the medication as instructed. (NOTE: After completing a 2 week course of Lovenox, take one Enteric-coated aspirin once a day. This along with elevation will help reduce the possibility of phlebitis in your operated leg.) Do not drive or drink alcoholic beverages when taking pain medications.  CALL THE OFFICE FOR: Temperature above 101 degrees Excessive bleeding or drainage on the dressing. Excessive swelling, coldness, or paleness of the toes. Persistent nausea and vomiting.  FOLLOW-UP:  You  should have an appointment to return to the office in 10-14 days after surgery. Arrangements have been made for continuation of Physical Therapy (either home therapy or outpatient therapy).   [facility] Department Directory         [email or web address]       [email or web address]          Cardiology  Appointments: [place] - [phone] [place] - [phone]  Endocrinology  Appointments: [place] - [phone] [place] - [phone]  Gastroenterology  Appointments: [place] - [phone] [place] - [phone]        General Surgery   Appointments: [place] - [phone]  Internal Medicine/Family Medicine  Appointments: [place] - [phone] [place] - [phone] [place] - [phone]  Metabolic and Weigh Loss Surgery  Appointments: [place] - [phone]        Neurology  Appointments: [place] - [phone] [place] - [phone]  Neurosurgery  Appointments: [place] - [phone]  Obstetrics & Gynecology  Appointments: [place] - [phone] [place] - [phone]        Pediatrics  Appointments: [place] - [phone] [place] - [phone]  Physiatry  Appointments: [place] -[phone]  Physical Therapy  Appointments: [place] - [phone] [place] - [phone]        Podiatry  Appointments: [place] - [phone] [place] - [phone]  Pulmonology  Appointments: [place] - [phone]  Rheumatology  Appointments: [place] - [phone]        [place] Location: [facility]  [address] , [place]  [address]  [place] Location: [facility] [address] [place], [place]  [address]  [place] Location: [facility] [address] [place], [place]  [address]    

## 2020-08-29 ENCOUNTER — Encounter
Admission: RE | Admit: 2020-08-29 | Discharge: 2020-08-29 | Disposition: A | Discharge status: Home / Self Care | Payer: Medicare Other | Source: Ambulatory Visit | Attending: Orthopedic Surgery | Admitting: Orthopedic Surgery

## 2020-08-29 ENCOUNTER — Encounter: Payer: Self-pay | Admitting: Orthopedic Surgery

## 2020-08-29 ENCOUNTER — Other Ambulatory Visit: Payer: Self-pay

## 2020-08-29 DIAGNOSIS — Z01812 Encounter for preprocedural laboratory examination: Secondary | ICD-10-CM | POA: Insufficient documentation

## 2020-08-29 HISTORY — DX: Restless legs syndrome: G25.81

## 2020-08-29 HISTORY — DX: Obesity, unspecified: E66.9

## 2020-08-29 HISTORY — DX: Essential (primary) hypertension: I10

## 2020-08-29 HISTORY — DX: Anemia, unspecified: D64.9

## 2020-08-29 HISTORY — DX: Localized edema: R60.0

## 2020-08-29 LAB — COMPREHENSIVE METABOLIC PANEL
ALT: 18 U/L (ref 0–44)
AST: 20 U/L (ref 15–41)
Albumin: 3.9 g/dL (ref 3.5–5.0)
Alkaline Phosphatase: 56 U/L (ref 38–126)
Anion gap: 10 (ref 5–15)
BUN: 15 mg/dL (ref 8–23)
CO2: 24 mmol/L (ref 22–32)
Calcium: 9 mg/dL (ref 8.9–10.3)
Chloride: 103 mmol/L (ref 98–111)
Creatinine, Ser: 1.12 mg/dL (ref 0.61–1.24)
GFR, Estimated: >60 mL/min (ref >60)
Glucose, Bld: 103 mg/dL — ABNORMAL HIGH (ref 70–99)
Potassium: 4.4 mmol/L (ref 3.5–5.1)
Sodium: 137 mmol/L (ref 135–145)
Total Bilirubin: 1 mg/dL (ref 0.3–1.2)
Total Protein: 6.7 g/dL (ref 6.5–8.1)

## 2020-08-29 LAB — URINALYSIS, ROUTINE W REFLEX MICROSCOPIC
Bilirubin Urine: NEGATIVE
Glucose, UA: NEGATIVE mg/dL
Hgb urine dipstick: NEGATIVE
Ketones, ur: NEGATIVE mg/dL
Leukocytes,Ua: NEGATIVE
Nitrite: NEGATIVE
Protein, ur: NEGATIVE mg/dL
Specific Gravity, Urine: 1.02 (ref 1.005–1.030)
pH: 6 (ref 5.0–8.0)

## 2020-08-29 LAB — TYPE AND SCREEN
ABO/RH(D): AB POS
Antibody Screen: NEGATIVE

## 2020-08-29 LAB — CBC
HCT: 45 % (ref 39.0–52.0)
Hemoglobin: 14.2 g/dL (ref 13.0–17.0)
MCH: 25.4 pg — ABNORMAL LOW (ref 26.0–34.0)
MCHC: 31.6 g/dL (ref 30.0–36.0)
MCV: 80.4 fL (ref 80.0–100.0)
Platelets: 275 10*3/uL (ref 150–400)
RBC: 5.6 MIL/uL (ref 4.22–5.81)
RDW: 21.4 % — ABNORMAL HIGH (ref 11.5–15.5)
WBC: 7.4 10*3/uL (ref 4.0–10.5)
nRBC: 0 % (ref 0.0–0.2)

## 2020-08-29 LAB — PROTIME-INR
INR: 1.1 (ref 0.8–1.2)
Prothrombin Time: 13.3 seconds (ref 11.4–15.2)

## 2020-08-29 LAB — SURGICAL PCR SCREEN
MRSA, PCR: NEGATIVE
Staphylococcus aureus: POSITIVE — AB

## 2020-08-29 LAB — C-REACTIVE PROTEIN: CRP: 0.8 mg/dL (ref <1.0)

## 2020-08-29 LAB — SEDIMENTATION RATE: Sed Rate: 1 mm/hr (ref 0–20)

## 2020-08-29 LAB — APTT: aPTT: 26 seconds (ref 24–36)

## 2020-08-29 NOTE — Patient Instructions (Signed)
Your procedure is scheduled on: Friday 10/15 Report to Day Surgery. To find out your arrival time please call [phone] between 1PM - 3PM on Thurs 10/14.  Remember: Instructions that are not followed completely may result in serious medical risk,  up to and including death, or upon the discretion of your surgeon and anesthesiologist your  surgery may need to be rescheduled.     _X__ 1. Do not eat food after midnight the night before your procedure.                 No chewing gum or hard candies. You may drink clear liquids up to 2 hours                 before you are scheduled to arrive for your surgery- DO not drink clear                 liquids within 2 hours of the start of your surgery.                 Clear Liquids include:  water, apple juice without pulp, clear Gatorade, G2 or                  Gatorade Zero (avoid Red/Purple/Blue), Black Coffee or Tea (Do not add                 anything to coffee or tea). __x___2.   Complete the "Ensure Clear Pre-surgery Clear Carbohydrate Drink" provided to you, 2 hours before arrival. **If you       are diabetic you will be provided with an alternative drink, Gatorade Zero or G2.  __X__2.  On the morning of surgery brush your teeth with toothpaste and water, you                may rinse your mouth with mouthwash if you wish.  Do not swallow any toothpaste of mouthwash.     _X__ 3.  No Alcohol for 24 hours before or after surgery.   _X__ 4.  Do Not Smoke or use e-cigarettes For 24 Hours Prior to Your Surgery.                 Do not use any chewable tobacco products for at least 6 hours prior to                 Surgery.  ___  5.  Do not use any recreational drugs (marijuana, cocaine, heroin, ecstasy, MDMA or other)                For at least one week prior to your surgery.  Combination of these drugs with anesthesia                May have life threatening results.  ____  6.  Bring all medications with you on the day of surgery if  instructed.   __x__  7.  Notify your doctor if there is any change in your medical condition      (cold, fever, infections).     Do not wear jewelry, Do not wear lotions, . You may wear deodorant. Do not shave 48 hours prior to surgery. Men may shave face and neck. Do not bring valuables to the hospital.    [facility] is not responsible for any belongings or valuables.  Contacts, dentures or bridgework may not be worn into surgery. Leave your suitcase in the car. After surgery it may  be brought to your room. For patients admitted to the hospital, discharge time is determined by your treatment team.   Patients discharged the day of surgery will not be allowed to drive home.   Make arrangements for someone to be with you for the first 24 hours of your Same Day Discharge.    Please read over the following fact sheets that you were given:   Incentive spirometer    _x___ Take these medicines the morning of surgery with A SIP OF WATER:    1. acetaminophen (TYLENOL) 500 MG tablet if needed  2. buPROPion (WELLBUTRIN XL) 150 MG 24 hr tablet  3. cyclobenzaprine (FLEXERIL) 10 MG tablet if needed  4.gabapentin (NEURONTIN) 300 MG capsule  5.pantoprazole (PROTONIX) 40 MG tablet  Night before and morning of surgery  6.  ____ Fleet Enema (as directed)   _x___ Use CHG Soap (or wipes) as directed  ____ Use Benzoyl Peroxide Gel as instructed  ____ Use inhalers on the day of surgery  ____ Stop metformin 2 days prior to surgery    ____ Take 1/2 of usual insulin dose the night before surgery. No insulin the morning          of surgery.   ____ Stop Coumadin/Plavix/aspirin on   __x__ Stop Anti-inflammatories ibuprofen aleve or aspirin products until after surgery       May take tylenol   ____ Stop supplements until after surgery.    __x__ Bring C-Pap to the hospital.    If you have any questions regarding your pre-procedure instructions,  Please call Pre-admit Testing at  [place]

## 2020-08-30 ENCOUNTER — Other Ambulatory Visit
Admission: RE | Admit: 2020-08-30 | Discharge: 2020-08-30 | Disposition: A | Discharge status: Home / Self Care | Payer: Medicare Other | Source: Ambulatory Visit | Attending: Orthopedic Surgery | Admitting: Orthopedic Surgery

## 2020-08-30 DIAGNOSIS — Z20822 Contact with and (suspected) exposure to covid-19: Secondary | ICD-10-CM | POA: Insufficient documentation

## 2020-08-30 DIAGNOSIS — Z01812 Encounter for preprocedural laboratory examination: Secondary | ICD-10-CM | POA: Insufficient documentation

## 2020-08-30 LAB — URINE CULTURE
Culture: NO GROWTH
Special Requests: NORMAL

## 2020-08-30 NOTE — Progress Notes (Signed)
°  [facility] Perioperative Services: Pre-Admission/Anesthesia Testing  Abnormal Lab Notification  Date: 08/30/20  Name: RYATT CORSINO MRN:   [id number]  Re: Abnormal labs noted during PAT appointment  Provider Notified: Dereck Leep, MD Notification mode: Routed and/or faxed via [place] of concern: Lab Results  Component Value Date   STAPHAUREUS POSITIVE (A) 08/29/2020   MRSAPCR NEGATIVE 08/29/2020    Notes: Patient is scheduled for a RIGHT TOTAL KNEE ARTHROPLASTY on 09/01/2020. This is a Community education officer; no formal response required.   Honor Loh, MSN, APRN, FNP-C, CEN [facility]  Peri-operative Services Nurse Practitioner Phone: [phone] 08/30/20 8:14 AM

## 2020-08-30 NOTE — Progress Notes (Signed)
[facility] Perioperative Services  Pre-Admission/Anesthesia Testing Clinical Review  Date: 08/30/2020  Patient Demographics:  Name: Benjamin [name] DOB:   [birth year] MRN:   [id number]  Planned Surgical Procedure(s):    Case: [id number] Date/Time: 09/01/20 0700   Procedure: COMPUTER ASSISTED TOTAL KNEE ARTHROPLASTY - RNFA (Right Knee)   Anesthesia type: Choice   Pre-op diagnosis: PRIMARY OSTEOARTHRITIS OF RIGHT KNEE.   Location: [facility] OR ROOM 01 / [place] [facility] FOR ANESTHESIA GROUP   Surgeons: Dereck Leep, MD     NOTE: Available PAT nursing documentation and vital signs have been reviewed. Clinical nursing staff has updated patient's PMH/PSHx, current medication list, and drug allergies/intolerances to ensure comprehensive history available to assist in medical decision making as it pertains to the aforementioned surgical procedure and anticipated anesthetic course.   Clinical Discussion:  Benjamin [name] is a 69 y.o. male who is submitted for pre-surgical anesthesia review and clearance prior to him undergoing the above procedure. Patient has never been a smoker. Pertinent PMH includes: A. 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  Patient is followed by cardiology Clayborn Bigness, MD). He was last seen in the cardiology clinic on 07/10/2020; notes reviewed.  At the time of his clinic visit, patient denied any chest pain, shortness of breath, PND, orthopnea, vertiginous symptoms, palpitations, and presyncope/syncope.  Patient continues to have peripheral edema despite daily torsemide use.  ABIs and referral to vascular surgery have been ordered for further evaluation of patient's peripheral edema.  He has chronic restless leg syndrome and is currently  undergoing acupuncture for symptom management.  Last TTE from 05/2020 revealed a normal LV systolic function with an LVEF of >55%; mild valvular insufficiency.  Lexiscan done on 07/06/2020 revealed no evidence of stress-induced myocardial ischemia or arrhythmia; low risk scan (see full cardiovascular testing results below).  Patient not currently on GDMT for blood pressure or cholesterol management; being managed with lifestyle modifications.  Patient does not currently require chronic anticoagulation for his paroxysmal atrial fibrillation.  Patient compliant with nocturnal PAP therapy.  Patient scheduled follow-up with outpatient cardiology in 3 months.  Patient scheduled to undergo an elective orthopedic procedure on 09/01/2020 with Dr. Hoyt Koch.  Given patient's past medical history and recent cardiovascular testing, presurgical cardiac clearance was sought by the PAT team.  Per cardiology, "patient is optimized for surgery from a cardiovascular perspective.  He may proceed with planned procedure with a MODERATE risk stratification". This patient is not on daily anticoagulation therapy.   He denies previous perioperative complications with anesthesia. He underwent a general anesthetic course here (ASA III) in 01/2020 with no documented complications.  Of note, patient has a history of chronic lower back pain and is status post a L2-L5 lumbar fusion.  Will include results from most recent spinal imaging below for review by the anesthesia team in the event that neuraxial anesthetic course is considered as part of this patient's plan of care.  Vitals with BMI 08/29/2020 08/25/2020 08/14/2020  Height 5\' 8"  5\' 8"  -  Weight 317 lbs 14 oz 318 lbs -  BMI 03.70 48.88 -  Systolic 916 945 038  Diastolic 75 71 77  Pulse 77 87 81    Providers/Specialists:   NOTE: Primary physician provider listed below. Patient may have been seen by APP or partner within same practice.  PROVIDER ROLE LAST OV  Hooten, Laurice Record,  MD Orthopedic (Surgeon) 08/30/2020  Gladstone Lighter, MD Primary Care Provider 06/20/2020  Katrine Coho, MD Cardiology 07/10/2020   Allergies:  Pramipexole  Current Home Medications:   No current facility-administered medications for this encounter.   Marland Kitchen acetaminophen (TYLENOL) 500 MG tablet  . buPROPion (WELLBUTRIN XL) 150 MG 24 hr tablet  . cyclobenzaprine (FLEXERIL) 10 MG tablet  . gabapentin (NEURONTIN) 300 MG capsule  . oxymetazoline (AFRIN) 0.05 % nasal spray  . pantoprazole (PROTONIX) 40 MG tablet  . potassium chloride (KLOR-CON) 10 MEQ tablet  . tadalafil (CIALIS) 5 MG tablet  . testosterone cypionate (DEPOTESTOSTERONE CYPIONATE) 200 MG/ML injection  . traZODone (DESYREL) 50 MG tablet  . torsemide (DEMADEX) 10 MG tablet   History:   Past Medical History:  Diagnosis Date  . Anemia   . Arthritis   . 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 Depression   . Dysrhythmia    a fib  . Edema of both lower legs   . GERD (gastroesophageal reflux disease)   . Hypertension   . Obesity   . OSA (obstructive sleep apnea)    uses C-pap machine   . Restless legs syndrome    Past Surgical History:  Procedure Laterality Date  . BACK SURGERY  01/2017 and 01/2018  . colonscopy      x 2  . KNEE ARTHROPLASTY Left 01/26/2020   Procedure: COMPUTER ASSISTED TOTAL KNEE ARTHROPLASTY;  Surgeon: Dereck Leep, MD;  Location: [facility] ORS;  Service: Orthopedics;  Laterality: Left;  . KNEE SURGERY Right    arthroscopy  . none    . TONSILLECTOMY  1964   Family History  Problem Relation Age of Onset  . CAD Neg Hx   . Diabetes Mellitus II Neg Hx    Social History   Tobacco Use  . Smoking status: Never Smoker  . Smokeless tobacco: Current User  Vaping Use  . Vaping Use: Never used  Substance Use Topics  . Alcohol use: Yes    Comment: occassionally  . Drug use: No    Pertinent Clinical Results:  LABS: Labs reviewed: Acceptable for surgery.  Hospital Outpatient  Visit on 08/30/2020  Component Date Value Ref Range Status  . SARS Coronavirus 2 08/30/2020 NEGATIVE  NEGATIVE Final   Comment: (NOTE) SARS-CoV-2 target nucleic acids are NOT DETECTED.  The SARS-CoV-2 RNA is generally detectable in upper and lower respiratory specimens during the acute phase of infection. Negative results do not preclude SARS-CoV-2 infection, do not rule out co-infections with other pathogens, and should not be used as the sole basis for treatment or other patient management decisions. Negative results must be combined with clinical observations, patient history, and epidemiological information. The expected result is Negative.  Fact Sheet for Patients: SugarRoll.be  Fact Sheet for Healthcare Providers: https://www.woods-mathews.com/  This test is not yet approved or cleared by the [place] FDA and  has been authorized for detection and/or diagnosis of SARS-CoV-2 by FDA under an Emergency Use Authorization (EUA). This EUA will remain  in effect (meaning this test can be used) for the duration of the COVID-19 declaration under Se                          ction 564(b)(1) of the Act, 21 U.S.C. section 360bbb-3(b)(1), unless the authorization is terminated or revoked sooner.  Performed at [facility], [place]  [address], [place], [place] [address]   Hospital Outpatient Visit on 08/29/2020  Component Date Value Ref Range Status  . CRP 08/29/2020 0.8  <1.0 mg/dL Final   Performed at [place] [address], [place], [place] [address]  . Sed Rate 08/29/2020 1  0 - 20 mm/hr Final   Performed at [facility], [address] [place], [place] [address]  . MRSA, PCR 08/29/2020 NEGATIVE  NEGATIVE Final  . Staphylococcus aureus 08/29/2020 POSITIVE* NEGATIVE Final   Comment: (NOTE) The Xpert SA Assay (FDA approved for NASAL specimens in patients 31 years of age and older), is one component of a  comprehensive surveillance program. It is not intended to diagnose infection nor to guide or monitor treatment. Performed at [facility], [address] [place], [place] [address]   . aPTT 08/29/2020 26  24 - 36 seconds Final   Performed at [facility], [address] [place], [place] [address]  . WBC 08/29/2020 7.4  4.0 - 10.5 K/uL Final  . RBC 08/29/2020 5.60  4.22 - 5.81 MIL/uL Final  . Hemoglobin 08/29/2020 14.2  13.0 - 17.0 g/dL Final  . HCT 08/29/2020 45.0  39 - 52 % Final  . MCV 08/29/2020 80.4  80.0 - 100.0 fL Final  . MCH 08/29/2020 25.4* 26.0 - 34.0 pg Final  . MCHC 08/29/2020 31.6  30.0 - 36.0 g/dL Final  . RDW 08/29/2020 21.4* 11.5 - 15.5 % Final  . Platelets 08/29/2020 275  150 - 400 K/uL Final  . nRBC 08/29/2020 0.0  0.0 - 0.2 % Final   Performed at [facility], [address] [place], [place] [address]  . Sodium 08/29/2020 137  135 - 145 mmol/L Final  . Potassium 08/29/2020 4.4  3.5 - 5.1 mmol/L Final  . Chloride 08/29/2020 103  98 - 111 mmol/L Final  . CO2 08/29/2020 24  22 - 32 mmol/L Final  . Glucose, Bld 08/29/2020 103* 70 - 99 mg/dL Final   Glucose reference range applies only to samples taken after fasting for at least 8 hours.  . BUN 08/29/2020 15  8 - 23 mg/dL Final  . Creatinine, Ser 08/29/2020 1.12  0.61 - 1.24 mg/dL Final  . Calcium 08/29/2020 9.0  8.9 - 10.3 mg/dL Final  . Total Protein 08/29/2020 6.7  6.5 - 8.1 g/dL Final  . Albumin 08/29/2020 3.9  3.5 - 5.0 g/dL Final  . AST 08/29/2020 20  15 - 41 U/L Final  . ALT 08/29/2020 18  0 - 44 U/L Final  . Alkaline Phosphatase 08/29/2020 56  38 - 126 U/L Final  . Total Bilirubin 08/29/2020 1.0  0.3 - 1.2 mg/dL Final  . GFR, Estimated 08/29/2020 >60  >60 mL/min Final  . Anion gap 08/29/2020 10  5 - 15 Final   Performed at [facility], [address], [place], [place] [address]  . Prothrombin Time 08/29/2020 13.3  11.4 - 15.2 seconds Final  . INR 08/29/2020 1.1   0.8 - 1.2 Final   Comment: (NOTE) INR goal varies based on device and disease states. Performed at [facility], [address], [place], [place] [address]   . ABO/RH(D) 08/29/2020 AB POS   Final  . Antibody Screen 08/29/2020 NEG   Final  . Sample Expiration 08/29/2020 09/12/2020,2359   Final  . Extend sample reason 08/29/2020    Final  Value:NO TRANSFUSIONS OR PREGNANCY IN THE PAST 3 MONTHS Performed at [facility], [address] [place], [place] [address]   . Color, Urine 08/29/2020 YELLOW* YELLOW Final  . APPearance 08/29/2020 CLEAR* CLEAR Final  . Specific Gravity, Urine 08/29/2020 1.020  1.005 - 1.030 Final  . pH 08/29/2020 6.0  5.0 - 8.0 Final  . Glucose, UA 08/29/2020 NEGATIVE  NEGATIVE mg/dL Final  . Hgb urine dipstick 08/29/2020 NEGATIVE  NEGATIVE Final  . Bilirubin Urine 08/29/2020 NEGATIVE  NEGATIVE Final  . Ketones, ur 08/29/2020 NEGATIVE  NEGATIVE mg/dL Final  . Protein, ur 08/29/2020 NEGATIVE  NEGATIVE mg/dL Final  . Nitrite 08/29/2020 NEGATIVE  NEGATIVE Final  . Chalmers Guest 08/29/2020 NEGATIVE  NEGATIVE Final   Performed at [facility], [address] [place], [place] [address]  . Specimen Description 08/29/2020    Final                   Value:URINE, RANDOM Performed at [facility], [address] [place], [place] [address]   . Special Requests 08/29/2020    Final                   Value:Normal Performed at [facility], [address] [place], [place] [address]   . Culture 08/29/2020    Final                   Value:NO GROWTH Performed at [facility], [place] [address], [place], [place] [address]   . Report Status 08/29/2020 08/30/2020 FINAL   Final    ECG: Date: 06/09/2020 Rate: 73 bpm Rhythm: normal sinus Axis (leads I and aVF): Normal Intervals: PR 122 ms. QRS 90 ms. QTc 418 ms. ST segment and T wave changes: Nonspecific T wave abnormalities  Comparison: Similar to  previous tracing obtained on 09/15/2019 NOTE: Tracing obtained at [facility]; unable for review. Above based on cardiologist's interpretation.   IMAGING / PROCEDURES: ANKLE-BRACHIAL INDICES done on 08/07/2020 1. Right Ankle-Brachial Index:  1.06  2. Left Ankle-Brachial Index:  0.99  3. Right ankle-brachial indices within normal limits.  4. Left ankle-brachial index within normal limits.   LEXISCAN done on 07/06/2020 1. LVEF 50% 2. Regional wall motion reveals normal myocardial thickening and wall motion 3. Overall quality of the study is good 4. No artifacts noted 5. Normal LV cavity 6. No evidence of stress-induced myocardial ischemia or arrhythmia  ECHOCARDIOGRAM done on 06/14/2020 1. Normal LV systolic function with an estimated EF of >55% 2. Normal RV systolic function 3. Mild TV insufficiency 4. Trace MV and AV insufficiency 5. No valvular stenosis 6. Mild LA enlargement 7. Mild LVH 8. Mildly dilated aortic root measuring up to 3.8 cm  MRI CERVICAL SPINE WO CONTRAST done on 04/20/2019 1. Central protrusion at C3-4 seen on the prior examination has regressed but deformity of the ventral cord by disc persist.  2. Left facet degenerative change has worsened with subchondral cyst formation and mild edema in the facets.  3. Severe left foraminal narrowing is again seen. 4. Broad-based central protrusion at C4-5 deforming the cord has slightly increased since the prior examination.  5. Moderate bilateral foraminal narrowing is present. Slight deformity of the ventral cord and severe left foraminal narrowing at C5-6 appear unchanged. 6. No change in mild deformity of the ventral cord at C6-7 and severe left foraminal narrowing.  DIAGNOSTIC RADIOGRAPHS OF LUMBAR SPINE done on 01/20/2018 1. Evidence of spinal fusion of the  low lumbar spine from L2 through L5.  Impression and Plan:  Benjamin [name] has been referred for pre-anesthesia review and clearance prior to him  undergoing the planned anesthetic and procedural courses. Available labs, pertinent testing, and imaging results were personally reviewed by me. This patient has been appropriately cleared by cardiology.   Based on clinical review performed today (08/30/2020), barring any significant acute changes in the patient's overall condition, it is anticipated that he will be able to proceed with the planned surgical intervention. Any acute changes in clinical condition may necessitate his procedure being postponed and/or cancelled. Pre-surgical instructions were reviewed with the patient during his PAT appointment and questions were fielded by PAT clinical staff.  Honor Loh, MSN, APRN, FNP-C, CEN [facility]  Peri-operative Services Nurse Practitioner Phone: [phone] 08/31/20 8:44 AM  NOTE: This note has been prepared using Dragon dictation software. Despite my best ability to proofread, there is always the potential that unintentional transcriptional errors may still occur from this process.

## 2020-08-31 ENCOUNTER — Other Ambulatory Visit: Payer: BLUE CROSS/BLUE SHIELD

## 2020-08-31 LAB — SARS CORONAVIRUS 2 (TAT 6-24 HRS): SARS Coronavirus 2: NEGATIVE

## 2020-08-31 MED ORDER — DEXTROSE 5 % IV SOLN
3.0000 g | INTRAVENOUS | Status: DC
  Filled 2020-08-31: qty 3000

## 2020-09-01 ENCOUNTER — Other Ambulatory Visit: Payer: Self-pay

## 2020-09-01 ENCOUNTER — Encounter
Admission: RE | Disposition: A | Discharge status: Home Health | Payer: Self-pay | Source: Home / Self Care | Attending: Orthopedic Surgery

## 2020-09-01 ENCOUNTER — Inpatient Hospital Stay: Payer: Medicare Other

## 2020-09-01 ENCOUNTER — Ambulatory Visit (INDEPENDENT_AMBULATORY_CARE_PROVIDER_SITE_OTHER): Payer: Medicare Other | Admitting: Nurse Practitioner

## 2020-09-01 ENCOUNTER — Inpatient Hospital Stay: Payer: Medicare Other | Admitting: Urgent Care

## 2020-09-01 ENCOUNTER — Encounter: Payer: Self-pay | Admitting: Orthopedic Surgery

## 2020-09-01 ENCOUNTER — Inpatient Hospital Stay
Admission: RE | Admit: 2020-09-01 | Discharge: 2020-09-03 | DRG: 470 | Disposition: A | Discharge status: Home Health | Payer: Medicare Other | Attending: Orthopedic Surgery | Admitting: Orthopedic Surgery

## 2020-09-01 ENCOUNTER — Encounter (INDEPENDENT_AMBULATORY_CARE_PROVIDER_SITE_OTHER): Payer: Self-pay

## 2020-09-01 ENCOUNTER — Encounter (INDEPENDENT_AMBULATORY_CARE_PROVIDER_SITE_OTHER): Payer: Medicare Other

## 2020-09-01 DIAGNOSIS — Z85828 Personal history of other malignant neoplasm of skin: Secondary | ICD-10-CM | POA: Diagnosis not present

## 2020-09-01 DIAGNOSIS — I480 Paroxysmal atrial fibrillation: Secondary | ICD-10-CM | POA: Diagnosis present

## 2020-09-01 DIAGNOSIS — F32A Depression, unspecified: Secondary | ICD-10-CM | POA: Diagnosis present

## 2020-09-01 DIAGNOSIS — Z79899 Other long term (current) drug therapy: Secondary | ICD-10-CM

## 2020-09-01 DIAGNOSIS — Z7982 Long term (current) use of aspirin: Secondary | ICD-10-CM

## 2020-09-01 DIAGNOSIS — Z20822 Contact with and (suspected) exposure to covid-19: Secondary | ICD-10-CM | POA: Diagnosis present

## 2020-09-01 DIAGNOSIS — E782 Mixed hyperlipidemia: Secondary | ICD-10-CM | POA: Diagnosis present

## 2020-09-01 DIAGNOSIS — Z96652 Presence of left artificial knee joint: Secondary | ICD-10-CM | POA: Diagnosis present

## 2020-09-01 DIAGNOSIS — K219 Gastro-esophageal reflux disease without esophagitis: Secondary | ICD-10-CM | POA: Diagnosis present

## 2020-09-01 DIAGNOSIS — Z720 Tobacco use: Secondary | ICD-10-CM | POA: Diagnosis not present

## 2020-09-01 DIAGNOSIS — Z79890 Hormone replacement therapy (postmenopausal): Secondary | ICD-10-CM | POA: Diagnosis not present

## 2020-09-01 DIAGNOSIS — M25761 Osteophyte, right knee: Secondary | ICD-10-CM | POA: Diagnosis present

## 2020-09-01 DIAGNOSIS — G2581 Restless legs syndrome: Secondary | ICD-10-CM | POA: Diagnosis present

## 2020-09-01 DIAGNOSIS — Z6841 Body Mass Index (BMI) 40.0 and over, adult: Secondary | ICD-10-CM

## 2020-09-01 DIAGNOSIS — M1711 Unilateral primary osteoarthritis, right knee: Secondary | ICD-10-CM | POA: Diagnosis present

## 2020-09-01 DIAGNOSIS — Z888 Allergy status to other drugs, medicaments and biological substances status: Secondary | ICD-10-CM

## 2020-09-01 DIAGNOSIS — F419 Anxiety disorder, unspecified: Secondary | ICD-10-CM | POA: Diagnosis present

## 2020-09-01 DIAGNOSIS — I10 Essential (primary) hypertension: Secondary | ICD-10-CM | POA: Diagnosis present

## 2020-09-01 DIAGNOSIS — Z79891 Long term (current) use of opiate analgesic: Secondary | ICD-10-CM

## 2020-09-01 DIAGNOSIS — Z96659 Presence of unspecified artificial knee joint: Secondary | ICD-10-CM

## 2020-09-01 DIAGNOSIS — Z981 Arthrodesis status: Secondary | ICD-10-CM | POA: Diagnosis not present

## 2020-09-01 DIAGNOSIS — G4733 Obstructive sleep apnea (adult) (pediatric): Secondary | ICD-10-CM | POA: Diagnosis present

## 2020-09-01 HISTORY — PX: KNEE ARTHROPLASTY: SHX992

## 2020-09-01 HISTORY — DX: Cardiac arrhythmia, unspecified: I49.9

## 2020-09-01 HISTORY — DX: Malignant (primary) neoplasm, unspecified: C80.1

## 2020-09-01 SURGERY — ARTHROPLASTY, KNEE, TOTAL, USING IMAGELESS COMPUTER-ASSISTED NAVIGATION
Anesthesia: General | Site: Knee | Laterality: Right

## 2020-09-01 MED ORDER — ALUM & MAG HYDROXIDE-SIMETH 200-200-20 MG/5ML PO SUSP: 30.0000 mL | ORAL | Status: DC | PRN

## 2020-09-01 MED ORDER — FAMOTIDINE 20 MG PO TABS
ORAL_TABLET | ORAL | Status: AC
  Administered 2020-09-01: 20 mg
  Filled 2020-09-01: qty 1

## 2020-09-01 MED ORDER — FAMOTIDINE 20 MG PO TABS: 20.0000 mg | ORAL_TABLET | Freq: Once | ORAL | Status: AC

## 2020-09-01 MED ORDER — ONDANSETRON HCL 4 MG/2ML IJ SOLN: 4.0000 mg | Freq: Four times a day (QID) | INTRAMUSCULAR | Status: DC | PRN

## 2020-09-01 MED ORDER — ONDANSETRON HCL 4 MG/2ML IJ SOLN
INTRAMUSCULAR | Status: DC | PRN
  Administered 2020-09-01: 4 mg via INTRAVENOUS

## 2020-09-01 MED ORDER — PANTOPRAZOLE SODIUM 40 MG PO TBEC
40.0000 mg | DELAYED_RELEASE_TABLET | Freq: Two times a day (BID) | ORAL | Status: DC
Start: 2020-09-01 — End: 2020-09-03
  Administered 2020-09-01 – 2020-09-03 (×4): 40 mg via ORAL
  Filled 2020-09-01 (×4): qty 1

## 2020-09-01 MED ORDER — FERROUS SULFATE 325 (65 FE) MG PO TABS
325.0000 mg | ORAL_TABLET | Freq: Two times a day (BID) | ORAL | Status: DC
  Administered 2020-09-02 (×2): 325 mg via ORAL
  Filled 2020-09-01 (×3): qty 1

## 2020-09-01 MED ORDER — TRANEXAMIC ACID-NACL 1000-0.7 MG/100ML-% IV SOLN: 1000.0000 mg | Freq: Once | INTRAVENOUS | Status: AC

## 2020-09-01 MED ORDER — PROPOFOL 10 MG/ML IV BOLUS
INTRAVENOUS | Status: AC
  Filled 2020-09-01: qty 20

## 2020-09-01 MED ORDER — BUPROPION HCL ER (XL) 150 MG PO TB24
300.0000 mg | ORAL_TABLET | Freq: Every day | ORAL | Status: DC
  Administered 2020-09-02 – 2020-09-03 (×2): 300 mg via ORAL
  Filled 2020-09-01 (×2): qty 2

## 2020-09-01 MED ORDER — ACETAMINOPHEN 10 MG/ML IV SOLN
INTRAVENOUS | Status: AC
  Filled 2020-09-01: qty 100

## 2020-09-01 MED ORDER — BISACODYL 10 MG RE SUPP: 10.0000 mg | Freq: Every day | RECTAL | Status: DC | PRN

## 2020-09-01 MED ORDER — TRAMADOL HCL 50 MG PO TABS
50.0000 mg | ORAL_TABLET | ORAL | Status: DC | PRN
  Administered 2020-09-02: 100 mg via ORAL
  Filled 2020-09-01: qty 2

## 2020-09-01 MED ORDER — SENNOSIDES-DOCUSATE SODIUM 8.6-50 MG PO TABS
1.0000 | ORAL_TABLET | Freq: Two times a day (BID) | ORAL | Status: DC
  Administered 2020-09-01 – 2020-09-03 (×4): 1 via ORAL
  Filled 2020-09-01 (×4): qty 1

## 2020-09-01 MED ORDER — SODIUM CHLORIDE 0.9 % IV SOLN: INTRAVENOUS | Status: DC

## 2020-09-01 MED ORDER — LACTATED RINGERS IV SOLN: INTRAVENOUS | Status: DC

## 2020-09-01 MED ORDER — TRANEXAMIC ACID-NACL 1000-0.7 MG/100ML-% IV SOLN
1000.0000 mg | INTRAVENOUS | Status: AC
  Administered 2020-09-01: 1000 mg via INTRAVENOUS

## 2020-09-01 MED ORDER — CHLORHEXIDINE GLUCONATE 0.12 % MT SOLN
15.0000 mL | Freq: Once | OROMUCOSAL | Status: AC
  Administered 2020-09-01: 15 mL via OROMUCOSAL

## 2020-09-01 MED ORDER — SUCCINYLCHOLINE CHLORIDE 20 MG/ML IJ SOLN
INTRAMUSCULAR | Status: DC | PRN
  Administered 2020-09-01: 140 mg via INTRAVENOUS

## 2020-09-01 MED ORDER — GABAPENTIN 300 MG PO CAPS: 300.0000 mg | ORAL_CAPSULE | Freq: Once | ORAL | Status: DC

## 2020-09-01 MED ORDER — MENTHOL 3 MG MT LOZG
1.0000 | LOZENGE | OROMUCOSAL | Status: DC | PRN
  Filled 2020-09-01: qty 9

## 2020-09-01 MED ORDER — TORSEMIDE 20 MG PO TABS: 40.0000 mg | ORAL_TABLET | Freq: Two times a day (BID) | ORAL | Status: DC | PRN

## 2020-09-01 MED ORDER — OXYCODONE HCL 5 MG PO TABS
10.0000 mg | ORAL_TABLET | ORAL | Status: DC | PRN
  Administered 2020-09-02 – 2020-09-03 (×3): 10 mg via ORAL
  Filled 2020-09-01 (×2): qty 2

## 2020-09-01 MED ORDER — FENTANYL CITRATE (PF) 100 MCG/2ML IJ SOLN
INTRAMUSCULAR | Status: DC | PRN
  Administered 2020-09-01 (×2): 100 ug via INTRAVENOUS
  Administered 2020-09-01: 50 ug via INTRAVENOUS

## 2020-09-01 MED ORDER — CELECOXIB 200 MG PO CAPS
400.0000 mg | ORAL_CAPSULE | Freq: Once | ORAL | Status: AC
  Administered 2020-09-01: 400 mg via ORAL

## 2020-09-01 MED ORDER — DIPHENHYDRAMINE HCL 12.5 MG/5ML PO ELIX: 12.5000 mg | ORAL_SOLUTION | ORAL | Status: DC | PRN

## 2020-09-01 MED ORDER — ENSURE PRE-SURGERY PO LIQD
296.0000 mL | Freq: Once | ORAL | Status: AC
  Administered 2020-09-01: 296 mL via ORAL
  Filled 2020-09-01: qty 296

## 2020-09-01 MED ORDER — HYDROMORPHONE HCL 1 MG/ML IJ SOLN
INTRAMUSCULAR | Status: DC | PRN
  Administered 2020-09-01: 1 mg via INTRAVENOUS

## 2020-09-01 MED ORDER — MAGNESIUM HYDROXIDE 400 MG/5ML PO SUSP
30.0000 mL | Freq: Every day | ORAL | Status: DC
  Administered 2020-09-02 – 2020-09-03 (×2): 30 mL via ORAL
  Filled 2020-09-01 (×2): qty 30

## 2020-09-01 MED ORDER — MIDAZOLAM HCL 2 MG/2ML IJ SOLN
INTRAMUSCULAR | Status: DC | PRN
  Administered 2020-09-01: 2 mg via INTRAVENOUS

## 2020-09-01 MED ORDER — CHLORHEXIDINE GLUCONATE 4 % EX LIQD
60.0000 mL | Freq: Once | CUTANEOUS | Status: AC
  Administered 2020-09-01: 4 via TOPICAL

## 2020-09-01 MED ORDER — ROCURONIUM BROMIDE 100 MG/10ML IV SOLN
INTRAVENOUS | Status: DC | PRN
  Administered 2020-09-01: 30 mg via INTRAVENOUS

## 2020-09-01 MED ORDER — ACETAMINOPHEN 325 MG PO TABS: 325.0000 mg | ORAL_TABLET | Freq: Four times a day (QID) | ORAL | Status: DC | PRN

## 2020-09-01 MED ORDER — OXYMETAZOLINE HCL 0.05 % NA SOLN
1.0000 | Freq: Two times a day (BID) | NASAL | Status: DC | PRN
  Filled 2020-09-01: qty 15

## 2020-09-01 MED ORDER — PROPOFOL 10 MG/ML IV BOLUS
INTRAVENOUS | Status: DC | PRN
  Administered 2020-09-01: 200 mg via INTRAVENOUS

## 2020-09-01 MED ORDER — DEXMEDETOMIDINE HCL 200 MCG/2ML IV SOLN
INTRAVENOUS | Status: DC | PRN
  Administered 2020-09-01: 20 ug via INTRAVENOUS

## 2020-09-01 MED ORDER — DEXTROSE 5 % IV SOLN
INTRAVENOUS | Status: DC | PRN
  Administered 2020-09-01 (×2): 3 g via INTRAVENOUS

## 2020-09-01 MED ORDER — ACETAMINOPHEN 10 MG/ML IV SOLN
1000.0000 mg | Freq: Four times a day (QID) | INTRAVENOUS | Status: AC
  Administered 2020-09-01 – 2020-09-02 (×4): 1000 mg via INTRAVENOUS
  Filled 2020-09-01 (×4): qty 100

## 2020-09-01 MED ORDER — CYCLOBENZAPRINE HCL 10 MG PO TABS: 10.0000 mg | ORAL_TABLET | Freq: Two times a day (BID) | ORAL | Status: DC | PRN

## 2020-09-01 MED ORDER — ONDANSETRON HCL 4 MG/2ML IJ SOLN: 4.0000 mg | Freq: Once | INTRAMUSCULAR | Status: DC | PRN

## 2020-09-01 MED ORDER — ONDANSETRON HCL 4 MG PO TABS: 4.0000 mg | ORAL_TABLET | Freq: Four times a day (QID) | ORAL | Status: DC | PRN

## 2020-09-01 MED ORDER — PHENOL 1.4 % MT LIQD
1.0000 | OROMUCOSAL | Status: DC | PRN
  Filled 2020-09-01: qty 177

## 2020-09-01 MED ORDER — TRANEXAMIC ACID-NACL 1000-0.7 MG/100ML-% IV SOLN
INTRAVENOUS | Status: AC
  Administered 2020-09-01: 1000 mg via INTRAVENOUS
  Filled 2020-09-01: qty 100

## 2020-09-01 MED ORDER — ONDANSETRON HCL 4 MG/2ML IJ SOLN
INTRAMUSCULAR | Status: AC
  Filled 2020-09-01: qty 2

## 2020-09-01 MED ORDER — HYDROMORPHONE HCL 1 MG/ML IJ SOLN: 0.5000 mg | INTRAMUSCULAR | Status: DC | PRN

## 2020-09-01 MED ORDER — HYDROMORPHONE HCL 1 MG/ML IJ SOLN
INTRAMUSCULAR | Status: AC
  Filled 2020-09-01: qty 1

## 2020-09-01 MED ORDER — DEXAMETHASONE SODIUM PHOSPHATE 10 MG/ML IJ SOLN
8.0000 mg | Freq: Once | INTRAMUSCULAR | Status: AC
  Administered 2020-09-01: 8 mg via INTRAVENOUS

## 2020-09-01 MED ORDER — FENTANYL CITRATE (PF) 250 MCG/5ML IJ SOLN
INTRAMUSCULAR | Status: AC
  Filled 2020-09-01: qty 5

## 2020-09-01 MED ORDER — LIDOCAINE HCL (PF) 2 % IJ SOLN
INTRAMUSCULAR | Status: AC
  Filled 2020-09-01: qty 5

## 2020-09-01 MED ORDER — SODIUM CHLORIDE 0.9 % IR SOLN
Status: DC | PRN
  Administered 2020-09-01: 500 mL

## 2020-09-01 MED ORDER — METOCLOPRAMIDE HCL 10 MG PO TABS
10.0000 mg | ORAL_TABLET | Freq: Three times a day (TID) | ORAL | Status: DC
  Administered 2020-09-01 – 2020-09-03 (×7): 10 mg via ORAL
  Filled 2020-09-01 (×7): qty 1

## 2020-09-01 MED ORDER — CEFAZOLIN SODIUM 1 G IJ SOLR
INTRAMUSCULAR | Status: AC
  Filled 2020-09-01: qty 30

## 2020-09-01 MED ORDER — DEXAMETHASONE SODIUM PHOSPHATE 10 MG/ML IJ SOLN
INTRAMUSCULAR | Status: AC
  Filled 2020-09-01: qty 1

## 2020-09-01 MED ORDER — SUCCINYLCHOLINE CHLORIDE 200 MG/10ML IV SOSY
PREFILLED_SYRINGE | INTRAVENOUS | Status: AC
  Filled 2020-09-01: qty 10

## 2020-09-01 MED ORDER — ENOXAPARIN SODIUM 30 MG/0.3ML ~~LOC~~ SOLN
30.0000 mg | Freq: Two times a day (BID) | SUBCUTANEOUS | Status: DC
  Administered 2020-09-02 – 2020-09-03 (×3): 30 mg via SUBCUTANEOUS
  Filled 2020-09-01 (×3): qty 0.3

## 2020-09-01 MED ORDER — BUPIVACAINE HCL (PF) 0.25 % IJ SOLN
INTRAMUSCULAR | Status: DC | PRN
  Administered 2020-09-01: 60 mL

## 2020-09-01 MED ORDER — OXYCODONE HCL 5 MG PO TABS
5.0000 mg | ORAL_TABLET | ORAL | Status: DC | PRN
  Administered 2020-09-01 (×2): 5 mg via ORAL
  Administered 2020-09-02 (×4): 10 mg via ORAL
  Filled 2020-09-01: qty 1
  Filled 2020-09-01 (×3): qty 2
  Filled 2020-09-01: qty 1
  Filled 2020-09-01 (×2): qty 2

## 2020-09-01 MED ORDER — POTASSIUM CHLORIDE ER 10 MEQ PO TBCR
10.0000 meq | EXTENDED_RELEASE_TABLET | Freq: Every day | ORAL | Status: DC | PRN
  Filled 2020-09-01: qty 1

## 2020-09-01 MED ORDER — CEFAZOLIN SODIUM-DEXTROSE 2-4 GM/100ML-% IV SOLN
2.0000 g | Freq: Four times a day (QID) | INTRAVENOUS | Status: AC
  Administered 2020-09-01 (×2): 2 g via INTRAVENOUS
  Filled 2020-09-01 (×2): qty 100

## 2020-09-01 MED ORDER — ACETAMINOPHEN 10 MG/ML IV SOLN
INTRAVENOUS | Status: DC | PRN
  Administered 2020-09-01: 1000 mg via INTRAVENOUS

## 2020-09-01 MED ORDER — SURGIPHOR WOUND IRRIGATION SYSTEM - OPTIME
TOPICAL | Status: DC | PRN
  Administered 2020-09-01: 450 mL via TOPICAL

## 2020-09-01 MED ORDER — SODIUM CHLORIDE 0.9 % IV SOLN
INTRAVENOUS | Status: DC | PRN
  Administered 2020-09-01: 60 mL

## 2020-09-01 MED ORDER — ROCURONIUM BROMIDE 10 MG/ML (PF) SYRINGE
PREFILLED_SYRINGE | INTRAVENOUS | Status: AC
  Filled 2020-09-01: qty 10

## 2020-09-01 MED ORDER — DEXMEDETOMIDINE (PRECEDEX) IN NS 20 MCG/5ML (4 MCG/ML) IV SYRINGE
PREFILLED_SYRINGE | INTRAVENOUS | Status: AC
  Filled 2020-09-01: qty 5

## 2020-09-01 MED ORDER — PHENYLEPHRINE HCL (PRESSORS) 10 MG/ML IV SOLN
INTRAVENOUS | Status: DC | PRN
  Administered 2020-09-01: 100 ug via INTRAVENOUS
  Administered 2020-09-01: 200 ug via INTRAVENOUS
  Administered 2020-09-01: 100 ug via INTRAVENOUS
  Administered 2020-09-01: 200 ug via INTRAVENOUS
  Administered 2020-09-01: 100 ug via INTRAVENOUS

## 2020-09-01 MED ORDER — FLEET ENEMA 7-19 GM/118ML RE ENEM: 1.0000 | ENEMA | Freq: Once | RECTAL | Status: DC | PRN

## 2020-09-01 MED ORDER — FENTANYL CITRATE (PF) 100 MCG/2ML IJ SOLN: 25.0000 ug | INTRAMUSCULAR | Status: DC | PRN

## 2020-09-01 MED ORDER — LIDOCAINE HCL (CARDIAC) PF 100 MG/5ML IV SOSY
PREFILLED_SYRINGE | INTRAVENOUS | Status: DC | PRN
  Administered 2020-09-01: 100 mg via INTRAVENOUS

## 2020-09-01 MED ORDER — ORAL CARE MOUTH RINSE: 15.0000 mL | Freq: Once | OROMUCOSAL | Status: AC

## 2020-09-01 MED ORDER — MIDAZOLAM HCL 2 MG/2ML IJ SOLN
INTRAMUSCULAR | Status: AC
  Filled 2020-09-01: qty 2

## 2020-09-01 MED ORDER — TRAZODONE HCL 50 MG PO TABS: 50.0000 mg | ORAL_TABLET | Freq: Every evening | ORAL | Status: DC | PRN

## 2020-09-01 SURGICAL SUPPLY — 87 items
ATTUNE MED DOME PAT 41 KNEE (Knees) ×2 IMPLANT
ATTUNE MED DOME PAT 41MM KNEE (Knees) ×1 IMPLANT
ATTUNE PS FEM RT SZ 7 CEM KNEE (Femur) ×3 IMPLANT
ATTUNE PSRP INSR SZ7 6 KNEE (Insert) ×2 IMPLANT
ATTUNE PSRP INSR SZ7 6MM KNEE (Insert) ×1 IMPLANT
BASE TIBIAL ROT PLAT SZ 8 KNEE (Knees) ×1 IMPLANT
BATTERY INSTRU NAVIGATION (MISCELLANEOUS) ×12 IMPLANT
BLADE SAW 70X12.5 (BLADE) ×3 IMPLANT
BLADE SAW 90X13X1.19 OSCILLAT (BLADE) ×3 IMPLANT
BLADE SAW 90X25X1.19 OSCILLAT (BLADE) ×3 IMPLANT
BONE CEMENT GENTAMICIN (Cement) ×6 IMPLANT
CANISTER PREVENA 45 (CANNISTER) IMPLANT
CANISTER SUCT 3000ML PPV (MISCELLANEOUS) ×3 IMPLANT
CEMENT BONE GENTAMICIN 40 (Cement) ×2 IMPLANT
COOLER POLAR GLACIER W/PUMP (MISCELLANEOUS) ×3 IMPLANT
COVER WAND RF STERILE (DRAPES) ×3 IMPLANT
CUFF TOURN SGL QUICK 24 (TOURNIQUET CUFF)
CUFF TOURN SGL QUICK 30 (TOURNIQUET CUFF)
CUFF TOURN SGL QUICK 34 (TOURNIQUET CUFF) ×2
CUFF TRNQT CYL 24X4X16.5-23 (TOURNIQUET CUFF) IMPLANT
CUFF TRNQT CYL 30X4X21-28X (TOURNIQUET CUFF) IMPLANT
CUFF TRNQT CYL 34X4.125X (TOURNIQUET CUFF) ×1 IMPLANT
DRAPE 3/4 80X56 (DRAPES) ×3 IMPLANT
DRSG DERMACEA 8X12 NADH (GAUZE/BANDAGES/DRESSINGS) ×3 IMPLANT
DRSG MEPILEX SACRM 8.7X9.8 (GAUZE/BANDAGES/DRESSINGS) ×3 IMPLANT
DRSG OPSITE POSTOP 4X14 (GAUZE/BANDAGES/DRESSINGS) ×3 IMPLANT
DRSG TEGADERM 4X4.75 (GAUZE/BANDAGES/DRESSINGS) ×3 IMPLANT
DURAPREP 26ML APPLICATOR (WOUND CARE) ×6 IMPLANT
ELECT REM PT RETURN 9FT ADLT (ELECTROSURGICAL) ×3
ELECTRODE REM PT RTRN 9FT ADLT (ELECTROSURGICAL) ×1 IMPLANT
EX-PIN ORTHOLOCK NAV 4X150 (PIN) ×6 IMPLANT
GLOVE BIO SURGEON STRL SZ7.5 (GLOVE) ×6 IMPLANT
GLOVE BIOGEL M STRL SZ7.5 (GLOVE) ×6 IMPLANT
GLOVE BIOGEL PI IND STRL 7.5 (GLOVE) ×1 IMPLANT
GLOVE BIOGEL PI INDICATOR 7.5 (GLOVE) ×2
GLOVE INDICATOR 8.0 STRL GRN (GLOVE) ×3 IMPLANT
GOWN STRL REUS W/ TWL LRG LVL3 (GOWN DISPOSABLE) ×2 IMPLANT
GOWN STRL REUS W/ TWL XL LVL3 (GOWN DISPOSABLE) ×1 IMPLANT
GOWN STRL REUS W/TWL LRG LVL3 (GOWN DISPOSABLE) ×4
GOWN STRL REUS W/TWL XL LVL3 (GOWN DISPOSABLE) ×2
HANDLE YANKAUER SUCT BULB TIP (MISCELLANEOUS) ×3 IMPLANT
HEMOVAC 400CC 10FR (MISCELLANEOUS) ×3 IMPLANT
HOLDER FOLEY CATH W/STRAP (MISCELLANEOUS) ×3 IMPLANT
HOOD PEEL AWAY FLYTE STAYCOOL (MISCELLANEOUS) ×6 IMPLANT
IRRIGATION SURGIPHOR STRL (IV SOLUTION) ×3 IMPLANT
KIT PREVENA INCISION MGT20CM45 (CANNISTER) ×3 IMPLANT
KIT TURNOVER KIT A (KITS) ×3 IMPLANT
KNIFE SCULPS 14X20 (INSTRUMENTS) ×3 IMPLANT
LABEL OR SOLS (LABEL) ×3 IMPLANT
MANIFOLD NEPTUNE II (INSTRUMENTS) ×3 IMPLANT
NDL SAFETY ECLIPSE 18X1.5 (NEEDLE) ×1 IMPLANT
NEEDLE HYPO 18GX1.5 SHARP (NEEDLE) ×2
NEEDLE SPNL 20GX3.5 QUINCKE YW (NEEDLE) ×6 IMPLANT
NS IRRIG 500ML POUR BTL (IV SOLUTION) ×3 IMPLANT
PACK TOTAL KNEE (MISCELLANEOUS) ×3 IMPLANT
PAD WRAPON POLAR KNEE (MISCELLANEOUS) IMPLANT
PAD WRAPON POLOR MULTI XL (MISCELLANEOUS) ×1 IMPLANT
PENCIL SMOKE EVACUATOR COATED (MISCELLANEOUS) IMPLANT
PENCIL SMOKE ULTRAEVAC 22 CON (MISCELLANEOUS) ×3 IMPLANT
PIN DRILL QUICK PACK ×3 IMPLANT
PIN FIXATION 1/8DIA X 3INL (PIN) ×9 IMPLANT
PULSAVAC PLUS IRRIG FAN TIP (DISPOSABLE) ×3
SOL .9 NS 3000ML IRR  AL (IV SOLUTION) ×2
SOL .9 NS 3000ML IRR UROMATIC (IV SOLUTION) ×1 IMPLANT
SOL PREP PVP 2OZ (MISCELLANEOUS) ×3
SOLUTION PREP PVP 2OZ (MISCELLANEOUS) ×1 IMPLANT
SPONGE DRAIN TRACH 4X4 STRL 2S (GAUZE/BANDAGES/DRESSINGS) ×3 IMPLANT
STAPLER SKIN PROX 35W (STAPLE) ×3 IMPLANT
STOCKINETTE IMPERV 14X48 (MISCELLANEOUS) IMPLANT
STRAP TIBIA SHORT (MISCELLANEOUS) ×3 IMPLANT
SUCTION FRAZIER HANDLE 10FR (MISCELLANEOUS) ×4
SUCTION TUBE FRAZIER 10FR DISP (MISCELLANEOUS) ×2 IMPLANT
SUT VIC AB 0 CT1 36 (SUTURE) ×6 IMPLANT
SUT VIC AB 1 CT1 36 (SUTURE) ×6 IMPLANT
SUT VIC AB 2-0 CT2 27 (SUTURE) ×3 IMPLANT
SYR 20ML LL LF (SYRINGE) ×3 IMPLANT
SYR 30ML LL (SYRINGE) ×6 IMPLANT
TIBIAL BASE ROT PLAT SZ 8 KNEE (Knees) ×3 IMPLANT
TIP FAN IRRIG PULSAVAC PLUS (DISPOSABLE) ×1 IMPLANT
TOWEL OR 17X26 4PK STRL BLUE (TOWEL DISPOSABLE) ×3 IMPLANT
TOWER CARTRIDGE SMART MIX (DISPOSABLE) ×3 IMPLANT
TRAY FOLEY MTR SLVR 16FR STAT (SET/KITS/TRAYS/PACK) ×3 IMPLANT
TUBING CONNECTING 10 (TUBING) ×2 IMPLANT
TUBING CONNECTING 10' (TUBING) ×1
WRAP-ON POLOR PAD MULTI XL (MISCELLANEOUS) ×1
WRAPON POLAR PAD KNEE (MISCELLANEOUS)
WRAPON POLOR PAD MULTI XL (MISCELLANEOUS) ×2

## 2020-09-01 NOTE — Op Note (Signed)
OPERATIVE NOTE  DATE OF SURGERY:  09/01/2020  PATIENT NAME:  CAINE BARFIELD   DOB: Oct 30, 1951  MRN: [id number]  PRE-OPERATIVE DIAGNOSIS: Degenerative arthrosis of the right knee, primary  POST-OPERATIVE DIAGNOSIS:  Same  PROCEDURE:  Right total knee arthroplasty using computer-assisted navigation  SURGEON:  Marciano Sequin. M.D.  ANESTHESIA: general  ESTIMATED BLOOD LOSS: 50 mL  FLUIDS REPLACED: 1600 mL of crystalloid  TOURNIQUET TIME: 136 minutes  DRAINS: 2 medium Hemovac drains  SOFT TISSUE RELEASES: Anterior cruciate ligament, posterior cruciate ligament, deep medial collateral ligament, patellofemoral ligament, posterolateral corner  IMPLANTS UTILIZED: DePuy Attune size 7 posterior stabilized femoral component (cemented), size 8 rotating platform tibial component (cemented), 41 mm medialized dome patella (cemented), and a 6 mm stabilized rotating platform polyethylene insert.  INDICATIONS FOR SURGERY: HANFORD LUST is a 69 y.o. year old male with a long history of progressive knee pain. X-rays demonstrated severe degenerative changes in tricompartmental fashion. The patient had not seen any significant improvement despite conservative nonsurgical intervention. After discussion of the risks and benefits of surgical intervention, the patient expressed understanding of the risks benefits and agree with plans for total knee arthroplasty.   The risks, benefits, and alternatives were discussed at length including but not limited to the risks of infection, bleeding, nerve injury, stiffness, blood clots, the need for revision surgery, cardiopulmonary complications, among others, and they were willing to proceed.  PROCEDURE IN DETAIL: The patient was brought into the operating room and, after adequate general anesthesia was achieved, a tourniquet was placed on the patient's upper thigh. The patient's knee and leg were cleaned and prepped with alcohol and DuraPrep and draped in  the usual sterile fashion. A "timeout" was performed as per usual protocol. The lower extremity was exsanguinated using an Esmarch, and the tourniquet was inflated to 300 mmHg. An anterior longitudinal incision was made followed by a standard mid vastus approach. The deep fibers of the medial collateral ligament were elevated in a subperiosteal fashion off of the medial flare of the tibia so as to maintain a continuous soft tissue sleeve. The patella was subluxed laterally and the patellofemoral ligament was incised. Inspection of the knee demonstrated severe degenerative changes with full-thickness loss of articular cartilage. Osteophytes were debrided using a rongeur. Anterior and posterior cruciate ligaments were excised. Two 4.0 mm Schanz pins were inserted in the femur and into the tibia for attachment of the array of trackers used for computer-assisted navigation. Hip center was identified using a circumduction technique. Distal landmarks were mapped using the computer. The distal femur and proximal tibia were mapped using the computer. The distal femoral cutting guide was positioned using computer-assisted navigation so as to achieve a 5 distal valgus cut. The femur was sized and it was felt that a size 7 femoral component was appropriate. A size 7 femoral cutting guide was positioned and the anterior cut was performed and verified using the computer. This was followed by completion of the posterior and chamfer cuts. Femoral cutting guide for the central box was then positioned in the center box cut was performed.  Attention was then directed to the proximal tibia. Medial and lateral menisci were excised. The extramedullary tibial cutting guide was positioned using computer-assisted navigation so as to achieve a 0 varus-valgus alignment and 3 posterior slope. The cut was performed and verified using the computer. The proximal tibia was sized and it was felt that a size 8 tibial tray was appropriate.  Tibial and femoral trials were  inserted followed by insertion of a 5 mm polyethylene insert. The knee was felt to be tight laterally.  The trial components were removed and the knee was brought into full extension and distracted using the Moreland retractors.  The posterolateral corner was carefully released using a combination of electrocautery and Metzenbaum scissors.  Trial components were reinserted followed by placement of a 6 mm polyethylene trial.  This allowed for excellent mediolateral soft tissue balancing both in flexion and in full extension. Finally, the patella was cut and prepared so as to accommodate a 41 mm medialized dome patella. A patella trial was placed and the knee was placed through a range of motion with excellent patellar tracking appreciated. The femoral trial was removed after debridement of posterior osteophytes. The central post-hole for the tibial component was reamed followed by insertion of a keel punch. Tibial trials were then removed. Cut surfaces of bone were irrigated with copious amounts of normal saline using pulsatile lavage and then suctioned dry. Polymethylmethacrylate cement  with gentamicin was prepared in the usual fashion using a vacuum mixer. Cement was applied to the cut surface of the proximal tibia as well as along the undersurface of a size 8 rotating platform tibial component. Tibial component was positioned and impacted into place. Excess cement was removed using [employer] fast streamer. Cement was then applied to the cut surfaces of the femur as well as along the posterior flanges of the size 7 femoral component. The femoral component was positioned and impacted into place. Excess cement was removed using [employer] fast streamer. A 6 mm polyethylene trial was inserted and the knee was brought into full extension with steady axial compression applied. Finally, cement was applied to the backside of a 41 mm medialized dome patella and the patellar component was positioned and  patellar clamp applied. Excess cement was removed using [employer] fast streamer. After adequate curing of the cement, the tourniquet was deflated after a total tourniquet time of 136 minutes. Hemostasis was achieved using electrocautery. The knee was irrigated with copious amounts of normal saline using pulsatile lavage followed by 500 ml of Surgiphor and then suctioned dry. 20 mL of 1.3% Exparel and 60 mL of 0.25% Marcaine in 40 mL of normal saline was injected along the posterior capsule, medial and lateral gutters, and along the arthrotomy site. A 6 mm stabilized rotating platform polyethylene insert was inserted and the knee was placed through a range of motion with excellent mediolateral soft tissue balancing appreciated and excellent patellar tracking noted. 2 medium drains were placed in the wound bed and brought out through separate stab incisions. The medial parapatellar portion of the incision was reapproximated using interrupted sutures of #1 Vicryl. Subcutaneous tissue was approximated in layers using first #0 Vicryl followed #2-0 Vicryl. The skin was approximated with skin staples. A sterile dressing was applied.  The patient tolerated the procedure well and was transported to the recovery room in stable condition.    Cristina Ceniceros P. Holley Bouche., M.D.

## 2020-09-01 NOTE — Progress Notes (Signed)
   09/01/20 0745  Clinical Encounter Type  Visited With Family  Visit Type Initial  Referral From Chaplain  Consult/Referral To Chaplain  While rounding SDS waiting area, chaplain spoke to Pt's wife, Benjamin [name] to find out how she was doing and to see if she has any questions or concerns.

## 2020-09-01 NOTE — H&P (Signed)
ORTHOPAEDIC HISTORY & PHYSICAL Progress Notes Regino Bellow, PA - 08/30/2020 9:00 AM EDT Chief Complaint Chief Complaint  Patient presents with  . Right Knee - Pain  . Knee Pain  H&P-RT TKA-10.15.21/[facility]   Reason for Visit Benjamin [name] is a 69 y.o. who presents today for history and physical. He is to undergo a right total knee arthroplasty on 09/01/2020. Was last seen in the clinic on 07/11/2020. No change in his condition since that time in regards to the knee other than having increased discomfort. He is however having some numbness to the lateral right calf region. Also about 2 months after having the left total knee performed he developed a numbness to the L5 dermatome of the left thigh region. Has been seen by his back specialist who could not determine as the cause of the numbness discomfort.  Patient did undergo arthroscopic surgery on the right knee performed by Dr. Leanor Kail approximately 25 to 30 years ago. Has had some minor injuries to the knees over the years with no treatment. Patient does know that he has arthritic knees. Was scheduled for total knee arthroplasty in 2009 but due to his personal issues this was canceled. Over the years his condition has been gradually getting worse. Unable to come down steps normally. Comes down steps usually backwards. States that pain is increased to the point where he is having difficulty ambulating. Experiencing night pain. Taking 600 mg of ibuprofen in the morning and 600 mg in the evening. Has also had a hyaluronic acid injections in the past.  Patient states that the pain is increased to the point is significantly interfering with his activities of daily living. Past Medical History Past Medical History:  Diagnosis Date  . Anesthesia complication  Unexplainable crying  . Anxiety 11/2014  . Arthritis  . Atrial fibrillation (CMS-HCC)  . Bleeding ulcer 04/07/2018  . Depression  . GERD (gastroesophageal reflux disease)   . Hepatitis  Was sick from "hepatitis" as kid  . History of cancer 2019  skin  . History of motion sickness  . Mixed hyperlipidemia  . Obesity  . PVC's (premature ventricular contractions)  . Restless leg syndrome  . Sleep apnea  . Tremor 2008   Past Surgical History Past Surgical History:  Procedure Laterality Date  . AUTOGRAFT STRUCTURAL ICBG OBTAINED SEPARATE INCISION FOR SPINE SURGERY N/A 01/31/2017  Procedure: AUTOGRAFT FOR SPINE SURGERY ONLY (INCL HARVESTING GRAFT); STRUCTURAL, BICORTICAL OR TRICORTICAL (THROUGH SEPARATE SKIN OR FASCIAL INCISION) (LIST IN ADDITION TO PRIMARY PROCEDURE); Surgeon: Loni Dolly, MD; Location: [place]; Service: Orthopedics; Laterality: N/A;  . COLONOSCOPY  . EXPLORATION SPINAL FUSION Bilateral 01/20/2018  Procedure: EXPLORATION OF SPINAL FUSION; Surgeon: Loni Dolly, MD; Location: [place]; Service: Orthopedics; Laterality: Bilateral;  . INSTRUMENTATION POSTERIOR SPINE 3 TO 6 VERTEBRAL SEGMENTS Bilateral 01/20/2018  Procedure: POSTERIOR SEGMENTAL INSTRUMENTATION (EG, PEDICLE FIXATION, DUAL RODS WITH MULTIPLE HOOKS AND SUBLAMINAR WIRES); 3 TO 6 VERTEBRAL SEGMENTS; Surgeon: Loni Dolly, MD; Location: [place]; Service: Orthopedics; Laterality: Bilateral;  . knee arthoscopy Right  . LAMINECTOMY POSTERIOR CERVICLE DECOMP W/FACETECTOMY & FORAMINOTOMY Bilateral 01/31/2017  Procedure: LAMINECTOMY, FACETECTOMY AND FORAMINOTOMY, SINGLE VERTEBRAL SEGMENT; EACH ADDITIONAL SEGMENT, CERVICAL, THORACIC, OR LUMBAR (LIST IN ADDITION TO PRIMARY PROCEDURE); 82956 x2; Surgeon: Loni Dolly, MD; Location: [place]; Service: Orthopedics; Laterality: Bilateral;  . LAMINECTOMY POSTERIOR CERVICLE DECOMP W/FACETECTOMY & FORAMINOTOMY Bilateral 01/20/2018  Procedure: LAMINECTOMY, FACETECTOMY AND FORAMINOTOMY, SINGLE VERTEBRAL SEGMENT; EACH ADDITIONAL SEGMENT, CERVICAL, THORACIC, OR LUMBAR; 21308 x3; Surgeon: Velna Ochs,  Nicholaus Corolla, MD; Location: [place]; Service:  Orthopedics; Laterality: Bilateral;  . LAMINECTOMY POSTERIOR LUMBAR FACETECTOMY & FORAMINOTOMY W/DECOMP Bilateral 01/31/2017  Procedure: LAMINECTOMY, FACETECTOMY AND FORAMINOTOMY (UNILATERAL OR BILATERAL WITH DECOMPRESSION OF SPINAL CORD, CAUDA EQUINA AND/OR NERVE ROOT(S), SINGLE VERTEBRAL SEGMENT; LUMBAR; Surgeon: Loni Dolly, MD; Location: [place]; Service: Orthopedics; Laterality: Bilateral;  . LAMINECTOMY POSTERIOR LUMBAR FACETECTOMY & FORAMINOTOMY W/DECOMP Bilateral 01/20/2018  Procedure: LAMINECTOMY, FACETECTOMY AND FORAMINOTOMY (UNILATERAL OR BILATERAL WITH DECOMPRESSION OF SPINAL CORD, CAUDA EQUINA AND/OR NERVE ROOT(S), SINGLE VERTEBRAL SEGMENT; LUMBAR; Surgeon: Loni Dolly, MD; Location: [place]; Service: Orthopedics; Laterality: Bilateral;  . Left total knee arthroplasty using computer-assisted navigation 01/26/2020  Dr Marry Guan  . POSTERIOR LUMBAR SPINE FUSION ONE LEVEL LATERAL TRANSVERSE TECHNIQUE Bilateral 01/31/2017  Procedure: BILATERAL LUMBAR DECOMPRESSION L3-4, L4-5, L5-S1, DEBRIDEMENT EPIDURAL LIPOMATOSIS, SPINAL FUSION L4-5 BILATERAL, ICBG; Surgeon: Loni Dolly, MD; Location: [place]; Service: Orthopedics; Laterality: Bilateral;  . POSTERIOR LUMBAR SPINE FUSION ONE LEVEL LATERAL TRANSVERSE TECHNIQUE Bilateral 01/20/2018  Procedure: BILATERAL REEXPLORATION LUMBAR DECOMPRESSION L2-3, L3-4, L4-5, L5-S1. SPINAL FUSION L2-3, L3-4 BILATERAL. EXCISION DISC HERNIATION L3-4 LEFT. METAL REMOVAL, FUSION EXPLORATION L4-5. LOCAL AUTOGRAFT/ALLOGRAFT (VIVIGEN) left posterior iliac crest bone graft; Surgeon: Loni Dolly, MD; Location: [place]; Service: Orthopedics; Laterality: Bilateral;  . REMOVAL POSTERIOR SEGMENTAL LUMBAR/THORACIC SPINAL HARDWARE Bilateral 01/20/2018  Procedure: REMOVAL OF POSTERIOR SEGMENTAL THORACIC / LUMBAR INSTRUMENTATION; Surgeon: Loni Dolly, MD; Location: [place]; Service: Orthopedics; Laterality: Bilateral;  . SPINE SURGERY  2 lumber spine  surgery, injection in neck  . TONSILLECTOMY   Past Family History Family History  Problem Relation Age of Onset  . Cancer Mother  . Depression Mother  . Obesity Mother  valve repair  . Cancer Father  . Heart disease Father  . Heart valve disease Father   Medications Current Outpatient Medications Ordered in Epic  Medication Sig Dispense Refill  . aspirin 81 MG chewable tablet Take 81 mg by mouth once daily  . buPROPion (WELLBUTRIN XL) 150 MG XL tablet Take 3 tablets (450 mg total) by mouth once daily 270 tablet 3  . celecoxib (CELEBREX) 200 MG capsule TAKE 1 CAPSULE (200 MG TOTAL) BY MOUTH 2 (TWO) TIMES DAILY FOR 30 DAYS 60 capsule 1  . cyclobenzaprine (FLEXERIL) 10 MG tablet Take 1 tablet (10 mg total) by mouth as needed 30 tablet 5  . gabapentin (NEURONTIN) 300 MG capsule Take 2 capsules (600 mg total) by mouth once daily 180 capsule 3  . oxymetazoline (AFRIN) 0.05 % nasal spray Place 1 spray into both nostrils as needed for Congestion  . pantoprazole (PROTONIX) 40 MG DR tablet Take 1 tablet (40 mg total) by mouth once daily 90 tablet 3  . potassium chloride (KLOR-CON) 10 MEQ ER tablet TAKE 2 TABLETS (20 MEQ TOTAL) BY MOUTH ONCE DAILY 60 tablet 6  . sildenafil (REVATIO) 20 mg tablet Take 3 tablets by mouth one hour prior to sex as needed.  . tadalafiL (CIALIS) 5 MG tablet Take 1 tablet (5 mg total) by mouth once daily 30 tablet 11  . tamsulosin (FLOMAX) 0.4 mg capsule Take 1 capsule (0.4 mg total) by mouth once daily Take 30 minutes after same meal each day. 30 capsule 11  . testosterone cypionate (DEPO-TESTOSTERONE) 200 mg/mL injection Inject 1 mL (200 mg total) into the muscle every 14 (fourteen) days 2 mL 5  . TORsemide (DEMADEX) 10 MG tablet TAKE 4 TABLETS (40 MG TOTAL) BY MOUTH 2 (TWO) TIMES DAILY 240 tablet 1  . traZODone (DESYREL)  50 MG tablet TAKE 1 TO 2 TABLETS BY MOUTH AT BEDTIME AS NEEDED FOR INSOMNIA  . acetaminophen (TYLENOL ARTHRITIS PAIN) 650 MG ER tablet Take 650 mg  by mouth every 8 (eight) hours as needed for Pain (Patient not taking: Reported on 08/30/2020 )  . acetaminophen (TYLENOL) 500 MG tablet Take by mouth (Patient not taking: Reported on 08/30/2020 )   No current Epic-ordered facility-administered medications on file.   Allergies Allergies  Allergen Reactions  . Mirapex [Pramipexole] Itching  restlessness  . Ropinirole Other (See Comments)  Nasal congestion ("swells my nose shut")    Review of Systems A comprehensive 14 point ROS was performed, reviewed, and the pertinent orthopaedic findings are documented in the HPI.  Exam BP 136/80  Ht 172.7 cm (5\' 8" )  Wt (!) 141.5 kg (312 lb)  BMI 47.44 kg/m   General: Well-developed well-nourished male seen in no acute distress.   HEENT: Atraumatic,normocephalic. Pupils are equal and reactive to light. Oropharynx is clear with moist mucosa  Lungs: Clear to auscultation bilaterally   Cardiovascular: Regular rate and rhythm. Normal S1, S2. No murmurs. No appreciable gallops or rubs. Peripheral pulses are palpable.  Abdomen: Soft, non-tender, nondistended. Bowel sounds present  Extremity: Patient is a morbidly obese gentleman but very stocky. Ambulates with a slight antalgic gait. Does have some increased valgus deformity. Some mild lateral joint line discomfort noted to palpation. Range of motion shows -5 degrees extension with flexion being approximately 95 to 98 degrees. Does not appear to have any laxity to the collateral or cruciate ligaments. Neurovascular and neurosensory intact and within normal limits.  Neurological:  The patient is alert and oriented Sensation to light touch appears to be intact and within normal limits Gross motor strength appeared to be equal to 5/5  Vascular :  Peripheral pulses felt to be palpable. Capillary refill appears to be intact and within normal limits  X-ray  1. AP standing, lateral sunrise view of the right knee ordered and interpreted on  today's visit shows bone-on-bone to the lateral compartment space with subchondral sclerosis and osteophyte formation noted. Is noted to have severe degenerative changes to the patellofemoral articulation as well.  Impression  1. Degenerative arthrosis right knee  Plan   1. Patient currently taking no anticoagulation medication 2. Return to clinic 2 weeks postop 3. Did discuss with the patient approximately 15 minutes about his numbness that he is experiencing. This appears to be L5 nerve root.  This note was generated in part with voice recognition software and I apologize for any typographical errors that were not detected and corrected   Watt Climes PA  Electronically signed by Regino Bellow, PA at 08/30/2020 9:39 AM EDT

## 2020-09-01 NOTE — H&P (Signed)
The patient has been re-examined, and the chart reviewed, and there have been no interval changes to the documented history and physical.    The risks, benefits, and alternatives have been discussed at length. The patient expressed understanding of the risks benefits and agreed with plans for surgical intervention.  Rigo Letts P. Livi Mcgann, Jr. M.D.    

## 2020-09-01 NOTE — Anesthesia Preprocedure Evaluation (Signed)
Anesthesia Evaluation  Patient identified by MRN, date of birth, ID band Patient awake    Reviewed: Allergy & Precautions, H&P , NPO status , Patient's Chart, lab work & pertinent test results, reviewed documented beta blocker date and time   Airway Mallampati: II  TM Distance: >3 FB Neck ROM: full    Dental  (+) Teeth Intact   Pulmonary sleep apnea and Continuous Positive Airway Pressure Ventilation ,    Pulmonary exam normal        Cardiovascular Exercise Tolerance: Poor hypertension, On Medications Normal cardiovascular exam+ dysrhythmias  Rhythm:regular Rate:Normal     Neuro/Psych PSYCHIATRIC DISORDERS Depression  Neuromuscular disease    GI/Hepatic Neg liver ROS, PUD, GERD  Medicated,  Endo/Other  negative endocrine ROS  Renal/GU negative Renal ROS  negative genitourinary   Musculoskeletal   Abdominal   Peds  Hematology  (+) Blood dyscrasia, anemia ,   Anesthesia Other Findings Past Medical History: No date: Anemia No date: Arthritis No date: Cancer ([place])     Comment:  skin face and back basal cell No date: Depression No date: Dysrhythmia     Comment:  a fib No date: Edema of both lower legs No date: GERD (gastroesophageal reflux disease) No date: Hypertension No date: Obesity No date: OSA (obstructive sleep apnea)     Comment:  uses C-pap machine  No date: Restless legs syndrome Past Surgical History: 01/2017 and 01/2018: BACK SURGERY No date: colonscopy      Comment:  x 2 01/26/2020: KNEE ARTHROPLASTY; Left     Comment:  Procedure: COMPUTER ASSISTED TOTAL KNEE ARTHROPLASTY;                Surgeon: Dereck Leep, MD;  Location: [facility];                Service: Orthopedics;  Laterality: Left; No date: KNEE SURGERY; Right     Comment:  arthroscopy No date: none 1964: TONSILLECTOMY BMI    Body Mass Index: 48.34 kg/m     Reproductive/Obstetrics negative OB ROS                              Anesthesia Physical Anesthesia Plan  ASA: III  Anesthesia Plan: General ETT   Post-op Pain Management:    Induction:   PONV Risk Score and Plan:   Airway Management Planned:   Additional Equipment:   Intra-op Plan:   Post-operative Plan:   Informed Consent: I have reviewed the patients History and Physical, chart, labs and discussed the procedure including the risks, benefits and alternatives for the proposed anesthesia with the patient or authorized representative who has indicated his/her understanding and acceptance.     Dental Advisory Given  Plan Discussed with: CRNA  Anesthesia Plan Comments:         Anesthesia Quick Evaluation

## 2020-09-01 NOTE — Evaluation (Signed)
Physical Therapy Evaluation Patient Details Name: Benjamin [name] MRN: [id number] DOB: 16-May-1951 Today's Date: 09/01/2020   History of Present Illness  69 y/o male s/p R TKA 10/15, PT eval POD0.  h/o L TKA 7 months ago.  Clinical Impression  Pt showed great effort and had a good POD session.  He easily performed SLRs, had 0-67 AROM, performed all mobility w/o assist and was able to ambulate ~60 ft safely and with slow but consistent cadence.  He is eager to go home tomorrow and per today's performance this will likely be safe and appropriate.  He will need to perform steps before doing so (has 3 steps with L rail).     Follow Up Recommendations Follow surgeon's recommendation for DC plan and follow-up therapies    Equipment Recommendations  None recommended by PT    Recommendations for Other Services       Precautions / Restrictions Precautions Precautions: Fall;Knee Restrictions Weight Bearing Restrictions: Yes RLE Weight Bearing: Weight bearing as tolerated      Mobility  Bed Mobility Overal bed mobility: Modified Independent             General bed mobility comments: easily gets to EOB w/o assist  Transfers Overall transfer level: Independent Equipment used: Rolling walker (2 wheeled)             General transfer comment: Heavy reliance on UEs but able to ris w/o assist   Ambulation/Gait Ambulation/Gait assistance: Supervision Gait Distance (Feet): 60 Feet Assistive device: Rolling walker (2 wheeled)       General Gait Details: Pt did well with ambulation, initially with heavy UE reliance on walker and hesitancy with R WBing, improved comfort with increased distance.  O2 remained in 90s, HR remained below 100 with the effort.  Stairs            Wheelchair Mobility    Modified Rankin (Stroke Patients Only)       Balance Overall balance assessment: Modified Independent                                            Pertinent Vitals/Pain Pain Assessment: 0-10 Pain Score: 3  Pain Location: R knee    Home Living Family/patient expects to be discharged to:: Private residence Living Arrangements: Spouse/significant other;Children Available Help at Discharge: Family;Available PRN/intermittently (wife and son work) Type of Home: House Home Access: Stairs to enter Entrance Stairs-Rails: Left Entrance Stairs-Number of Steps: 3 Home Layout: Multi-level;Able to live on main level with bedroom/bathroom Home Equipment: Bedside commode;Shower seat;Grab bars - tub/shower;Walker - 4 wheels;Cane - single point      Prior Function Level of Independence: Independent         Comments: Per pt, ind community amb with no AD's, ind with ADL's, denies falls     Hand Dominance        Extremity/Trunk Assessment   Upper Extremity Assessment Upper Extremity Assessment: Overall WFL for tasks assessed    Lower Extremity Assessment Lower Extremity Assessment: Overall WFL for tasks assessed (expected post-op weakness, SLRs w/o issue)       Communication   Communication: No difficulties  Cognition Arousal/Alertness: Awake/alert Behavior During Therapy: WFL for tasks assessed/performed Overall Cognitive Status: Within Functional Limits for tasks assessed  General Comments      Exercises Total Joint Exercises Ankle Circles/Pumps: AROM;10 reps Quad Sets: Strengthening;10 reps Short Arc Quad: AROM;10 reps Heel Slides: AROM;5 reps (with lightly resisted leg extensions) Hip ABduction/ADduction: Strengthening;10 reps Straight Leg Raises: AROM;Strengthening;10 reps (3 second holds, light resistance at top) Knee Flexion: PROM;5 reps Goniometric ROM: 0-67   Assessment/Plan    PT Assessment Patient needs continued PT services  PT Problem List Decreased strength;Decreased range of motion;Decreased activity tolerance;Decreased balance;Decreased  mobility;Decreased knowledge of use of DME;Decreased safety awareness;Pain       PT Treatment Interventions DME instruction;Gait training;Stair training;Functional mobility training;Therapeutic activities;Therapeutic exercise;Balance training;Patient/family education    PT Goals (Current goals can be found in the Care Plan section)  Acute Rehab PT Goals Patient Stated Goal: go home tomorrow PT Goal Formulation: With patient Time For Goal Achievement: 09/15/20 Potential to Achieve Goals: Good    Frequency BID   Barriers to discharge        Co-evaluation               AM-PAC PT "6 Clicks" Mobility  Outcome Measure Help needed turning from your back to your side while in a flat bed without using bedrails?: None Help needed moving from lying on your back to sitting on the side of a flat bed without using bedrails?: None Help needed moving to and from a bed to a chair (including a wheelchair)?: None Help needed standing up from a chair using your arms (e.g., wheelchair or bedside chair)?: None Help needed to walk in hospital room?: None Help needed climbing 3-5 steps with a railing? : A Little 6 Click Score: 23    End of Session Equipment Utilized During Treatment: Gait belt Activity Tolerance: Patient tolerated treatment well Patient left: with chair alarm set;with call bell/phone within reach Nurse Communication: Mobility status PT Visit Diagnosis: Muscle weakness (generalized) (M62.81);Difficulty in walking, not elsewhere classified (R26.2);Pain Pain - Right/Left: Right Pain - part of body: Knee    Time: 2595-6387 PT Time Calculation (min) (ACUTE ONLY): 35 min   Charges:   PT Evaluation $PT Eval Low Complexity: 1 Low PT Treatments $Gait Training: 8-22 mins $Therapeutic Exercise: 8-22 mins        Kreg Shropshire, DPT 09/01/2020, 5:26 PM

## 2020-09-01 NOTE — Transfer of Care (Signed)
Immediate Anesthesia Transfer of Care Note  Patient: Benjamin [name]  Procedure(s) Performed: COMPUTER ASSISTED TOTAL KNEE ARTHROPLASTY - RNFA (Right Knee)  Patient Location: PACU  Anesthesia Type:General  Level of Consciousness: drowsy  Airway & Oxygen Therapy: Patient Spontanous Breathing and Patient connected to face mask oxygen  Post-op Assessment: Report given to RN  Post vital signs: stable  Last Vitals:  Vitals Value Taken Time  BP    Temp    Pulse    Resp    SpO2      Last Pain:  Vitals:   09/01/20 0709  TempSrc: Oral  PainSc: 0-No pain         Complications: No complications documented.

## 2020-09-01 NOTE — Anesthesia Procedure Notes (Signed)
Procedure Name: Intubation Performed by: Gaynelle Cage, CRNA Pre-anesthesia Checklist: Patient identified, Emergency Drugs available, Suction available and Patient being monitored Patient Re-evaluated:Patient Re-evaluated prior to induction Oxygen Delivery Method: Circle system utilized Preoxygenation: Pre-oxygenation with 100% oxygen Induction Type: IV induction Laryngoscope Size: McGraph and 3 Grade View: Grade II Tube type: Oral Tube size: 7.5 mm Number of attempts: 1 Placement Confirmation: ETT inserted through vocal cords under direct vision,  positive ETCO2 and breath sounds checked- equal and bilateral Secured at: 21 cm Tube secured with: Tape Dental Injury: Teeth and Oropharynx as per pre-operative assessment

## 2020-09-02 MED ORDER — SODIUM CHLORIDE 0.9 % IV SOLN
INTRAVENOUS | Status: DC | PRN
Start: 2020-09-02 — End: 2020-09-03
  Administered 2020-09-02: 250 mL via INTRAVENOUS

## 2020-09-02 MED ORDER — TRAMADOL HCL 50 MG PO TABS: 50.0000 mg | ORAL_TABLET | ORAL | 0 refills | Status: DC | PRN

## 2020-09-02 MED ORDER — ENOXAPARIN SODIUM 40 MG/0.4ML ~~LOC~~ SOLN: 40.0000 mg | SUBCUTANEOUS | 0 refills | Status: DC

## 2020-09-02 MED ORDER — OXYCODONE HCL 5 MG PO TABS
5.0000 mg | ORAL_TABLET | ORAL | 0 refills | Status: AC | PRN
Start: 2020-09-02 — End: ?

## 2020-09-02 NOTE — Progress Notes (Signed)
Physical Therapy Treatment Patient Details Name: Benjamin [name] MRN: [id number] DOB: June 05, 1951 Today's Date: 09/02/2020    History of Present Illness 69 y/o male s/p R TKA 10/15, PT eval POD0.  h/o L TKA 7 months ago.    PT Comments    Pt was long sitting in bed upon arriving. He agrees to PT session and is motivated throughout. Reports 3/10 pain at rest that elevated to 5/10 pain with wt bearing and ROM exercises. Pt was able to exit bed, stand, and ambulate with RW without physical assistance. Ambulated 200 ft around unit and able to safely perform ascending/descending stairs with +1 L rail. After ambulation, pt was able to perform AROM seated EOB knee flexion to 90 degrees. Pt requested to return to bed post session and was repositioned in long sitting with polar care reapplied and towel roll in place to promote ext. Pt is progressing well with PT goals. Follow surgeon's recommendations for follow up PT post hospitalization.     Follow Up Recommendations  Follow surgeon's recommendation for DC plan and follow-up therapies     Equipment Recommendations  Other (comment) (pt has all equipment needs met)    Recommendations for Other Services       Precautions / Restrictions Precautions Precautions: Fall;Knee Restrictions Weight Bearing Restrictions: Yes RLE Weight Bearing: Weight bearing as tolerated    Mobility  Bed Mobility Overal bed mobility: Modified Independent     Transfers Overall transfer level: Independent Equipment used: Rolling walker (2 wheeled) (bariatric)    General transfer comment: Pt was able to STS 3 x during session without assistance or cues  Ambulation/Gait Ambulation/Gait assistance: Supervision Gait Distance (Feet): 200 Feet Assistive device: Rolling walker (2 wheeled) Gait Pattern/deviations: Step-through pattern;Trunk flexed;Antalgic Gait velocity: decreased   General Gait Details: Pt was able to ambulate 200 ft without difficulty. no  LOB. minimal cueing for posture correction and improved heel strike to toe off sequencing   Stairs Stairs: Yes Stairs assistance: Supervision Stair Management: One rail Left;Backwards;Step to pattern Number of Stairs: 4 General stair comments: Pt was able to ascend/descend 4 stair with L rail only. did perform backwards technique on descending.        Balance Overall balance assessment: Modified Independent           Cognition Arousal/Alertness: Awake/alert Behavior During Therapy: WFL for tasks assessed/performed Overall Cognitive Status: Within Functional Limits for tasks assessed          General Comments: Pt is A and O x 4 and motivated to improve      Exercises Total Joint Exercises Knee Flexion: AROM;Right;10 reps;Seated Goniometric ROM: 0-90        Pertinent Vitals/Pain Pain Assessment: 0-10 Pain Score: 5  Pain Location: R knee Pain Descriptors / Indicators: Operative site guarding Pain Intervention(s): Limited activity within patient's tolerance;Monitored during session;Premedicated before session;Repositioned;Ice applied    Home Living Family/patient expects to be discharged to:: (P) Private residence Living Arrangements: (P) Spouse/significant other;Children Available Help at Discharge: (P) Family;Available PRN/intermittently (wife and son work) Type of Home: (P) House Home Access: (P) Stairs to enter Entrance Stairs-Rails: (P) Left Home Layout: (P) Multi-level;Able to live on main level with bedroom/bathroom Home Equipment: (P) Bedside commode;Shower seat;Grab bars - tub/shower;Walker - 4 wheels;Cane - single point;Adaptive equipment          PT Goals (current goals can now be found in the care plan section) Acute Rehab PT Goals Patient Stated Goal: go home Progress towards PT goals: Progressing toward goals  Frequency    BID      PT Plan Current plan remains appropriate       AM-PAC PT "6 Clicks" Mobility   Outcome Measure   Help needed turning from your back to your side while in a flat bed without using bedrails?: None Help needed moving from lying on your back to sitting on the side of a flat bed without using bedrails?: None Help needed moving to and from a bed to a chair (including a wheelchair)?: None Help needed standing up from a chair using your arms (e.g., wheelchair or bedside chair)?: None Help needed to walk in hospital room?: None Help needed climbing 3-5 steps with a railing? : None 6 Click Score: 24    End of Session Equipment Utilized During Treatment: Gait belt Activity Tolerance: Patient tolerated treatment well Patient left: in bed;with bed alarm set Nurse Communication: Mobility status PT Visit Diagnosis: Muscle weakness (generalized) (M62.81);Difficulty in walking, not elsewhere classified (R26.2);Pain Pain - Right/Left: Right Pain - part of body: Knee     Time: 8887-5797 PT Time Calculation (min) (ACUTE ONLY): 28 min  Charges:  $Gait Training: 8-22 mins $Therapeutic Exercise: 8-22 mins                     Julaine Fusi PTA 09/02/20, 9:36 AM

## 2020-09-02 NOTE — Progress Notes (Signed)
Physical Therapy Treatment Patient Details Name: Benjamin [name] MRN: [id number] DOB: [birth year] Today's Date: 09/02/2020    History of Present Illness 69 y/o male s/p R TKA 10/15, PT eval POD0.  h/o L TKA 7 months ago.    PT Comments    Pt was long sitting in bed. Agrees to 2nd /PM session. Was able to exit bed and ambulate 200 ft with RW without LOB or difficulty. Issued HEP handout and pt was I'ly able to perform. Overall pt is progress well. Will benefit from continued skilled PT at DC to address strength/ROM deficits. Will continue current POC going forward.    Follow Up Recommendations  Follow surgeon's recommendation for DC plan and follow-up therapies     Equipment Recommendations  Other (comment) (shower chair)    Recommendations for Other Services       Precautions / Restrictions Precautions Precautions: Fall;Knee Restrictions Weight Bearing Restrictions: Yes RLE Weight Bearing: Weight bearing as tolerated    Mobility  Bed Mobility Overal bed mobility: Modified Independent                Transfers Overall transfer level: Modified independent Equipment used: Rolling walker (2 wheeled)                Ambulation/Gait Ambulation/Gait assistance: Supervision Gait Distance (Feet): 200 Feet Assistive device: Rolling walker (2 wheeled) Gait Pattern/deviations: Step-through pattern Gait velocity: decreased   General Gait Details: pt ambulated 200 ft without LOB or difficulty       Balance Overall balance assessment: Modified Independent         Cognition Arousal/Alertness: Awake/alert Behavior During Therapy: WFL for tasks assessed/performed Overall Cognitive Status: Within Functional Limits for tasks assessed        General Comments: Highly motivated             Pertinent Vitals/Pain Pain Assessment: 0-10 Pain Score: 7  Pain Location: R knee Pain Descriptors / Indicators: Operative site guarding Pain Intervention(s): Limited  activity within patient's tolerance;Monitored during session;Premedicated before session;Repositioned           PT Goals (current goals can now be found in the care plan section) Acute Rehab PT Goals Patient Stated Goal: go home Progress towards PT goals: Progressing toward goals    Frequency    BID      PT Plan Current plan remains appropriate       AM-PAC PT "6 Clicks" Mobility   Outcome Measure  Help needed turning from your back to your side while in a flat bed without using bedrails?: None Help needed moving from lying on your back to sitting on the side of a flat bed without using bedrails?: None Help needed moving to and from a bed to a chair (including a wheelchair)?: None Help needed standing up from a chair using your arms (e.g., wheelchair or bedside chair)?: None Help needed to walk in hospital room?: None Help needed climbing 3-5 steps with a railing? : None 6 Click Score: 24    End of Session Equipment Utilized During Treatment: Gait belt Activity Tolerance: Patient tolerated treatment well Patient left: in bed;with bed alarm set Nurse Communication: Mobility status PT Visit Diagnosis: Muscle weakness (generalized) (M62.81);Difficulty in walking, not elsewhere classified (R26.2);Pain Pain - Right/Left: Right Pain - part of body: Knee     Time: 1344-1401 PT Time Calculation (min) (ACUTE ONLY): 17 min  Charges:  $Gait Training: 8-22 mins  Julaine Fusi PTA 09/02/20, 2:39 PM

## 2020-09-02 NOTE — Evaluation (Addendum)
Occupational Therapy Evaluation Patient Details Name: Benjamin [name] MRN: [id number] DOB: 10-Feb-1951 Today's Date: 09/02/2020    History of Present Illness 69 y/o male s/p R TKA 10/15, PT eval POD0.  h/o L TKA 7 months ago.   Clinical Impression   Mr Shidler was seen for OT evaluation this date, POD#1 from above surgery. Prior to hospital admission, pt was Independent in mobility, MOD I for ADLs using sock aid/reacher. Pt lives c wife and son available PRN in multilevel home, able to live on first floor. Pt presents to acute OT demonstrating near baseline ADL performance requiring AE only for LB access. Pt recalls polar care mgmt techniques and adapted dressing technqiues from prior L TKA. Pt instructed in falls prevention strategies, home/routines modifications, DME/AE for LB bathing and dressing tasks, and compression stocking mgt. Pt near baseline, will sign off at this time. Do not currently anticipate any OT needs following this hospitalization.       Follow Up Recommendations  No OT follow up    Equipment Recommendations  3-in-1 commode (pt reports wifes BSC is from 2009, not safe to use as shower seat)    Recommendations for Other Services       Precautions / Restrictions Precautions Precautions: Fall;Knee Restrictions Weight Bearing Restrictions: Yes RLE Weight Bearing: Weight bearing as tolerated      Mobility Bed Mobility Overal bed mobility: Modified Independent          Transfers Overall transfer level: Modified independent Equipment used: Rolling walker (2 wheeled)       General transfer comment: Requires high height for standing - endorses higher surfaces at home (elevated toilet and recliner)     Balance Overall balance assessment: Modified Independent          ADL either performed or assessed with clinical judgement   ADL Overall ADL's : Modified independent      General ADL Comments: Baseline uses sock aid to don socks - Ireaches mid  shin easily to pull up B socks. MOD I for ADL t/fs. MOD I seated grooming tasks                   Pertinent Vitals/Pain Pain Assessment: No/denies pain Pain Intervention(s): Limited activity within patient's tolerance;Monitored during session;Premedicated before session;Repositioned;Ice applied     Hand Dominance     Extremity/Trunk Assessment Upper Extremity Assessment Upper Extremity Assessment: Overall WFL for tasks assessed   Lower Extremity Assessment Lower Extremity Assessment: Overall WFL for tasks assessed       Communication Communication Communication: No difficulties   Cognition Arousal/Alertness: Awake/alert Behavior During Therapy: WFL for tasks assessed/performed Overall Cognitive Status: Within Functional Limits for tasks assessed     General Comments: Highly motivated   General Comments       Exercises Exercises: Other exercises Other Exercises Other Exercises: Pt educated re: DME recs, d/c recs, falls prevention, ECS, adapted dressing techniques, polar care mgmt Other Exercises: LBD, bed mobility, sup<>sit, UBD        Home Living Family/patient expects to be discharged to:: Private residence Living Arrangements: Spouse/significant other;Children Available Help at Discharge: Family;Available PRN/intermittently (wife and son work) Type of Home: House Home Access: Stairs to enter Technical brewer of Steps: 3 Entrance Stairs-Rails: Left Home Layout: Multi-level;Able to live on main level with bedroom/bathroom     Bathroom Shower/Tub: Tub/shower unit;Walk-in shower   Bathroom Toilet: Handicapped height     Home Equipment: Bedside commode;Shower seat;Grab bars - tub/shower;Walker - 4 wheels;Cane - single point;Adaptive  equipment Adaptive Equipment: Reacher;Sock aid        Prior Functioning/Environment Level of Independence: Independent        Comments: Independent community ambulation, MOD I for ADL's using sock aid        OT  Problem List: Decreased range of motion         OT Goals(Current goals can be found in the care plan section) Acute Rehab OT Goals Patient Stated Goal: go home OT Goal Formulation: With patient Time For Goal Achievement: 09/16/20 Potential to Achieve Goals: Good   AM-PAC OT "6 Clicks" Daily Activity     Outcome Measure Help from another person eating meals?: None Help from another person taking care of personal grooming?: None Help from another person toileting, which includes using toliet, bedpan, or urinal?: None Help from another person bathing (including washing, rinsing, drying)?: A Little Help from another person to put on and taking off regular upper body clothing?: None Help from another person to put on and taking off regular lower body clothing?: None 6 Click Score: 23   End of Session    Activity Tolerance: Patient tolerated treatment well Patient left: in bed;with call bell/phone within reach;with bed alarm set  OT Visit Diagnosis: Other abnormalities of gait and mobility (R26.89)                Time: 0092-3300 OT Time Calculation (min): 13 min Charges:  OT General Charges $OT Visit: 1 Visit OT Evaluation $OT Eval Low Complexity: 1 Low OT Treatments $Self Care/Home Management : 8-22 mins  Dessie Coma, M.S. OTR/L  09/02/20, 10:01 AM  ascom [phone]

## 2020-09-02 NOTE — Progress Notes (Signed)
  Subjective: 1 Day Post-Op Procedure(s) (LRB): COMPUTER ASSISTED TOTAL KNEE ARTHROPLASTY - RNFA (Right) Patient reports pain as mild and moderate.   Patient is well, and has had no acute complaints or problems Plan is to go Home after hospital stay. Negative for chest pain and shortness of breath Fever: no Gastrointestinal: Negative for nausea and vomiting  Objective: Vital signs in last 24 hours: Temp:  [97.1 F (36.2 C)-99.1 F (37.3 C)] 97.9 F (36.6 C) (10/16 0452) Pulse Rate:  [74-101] 74 (10/16 0452) Resp:  [14-20] 17 (10/16 0452) BP: (124-160)/(67-89) 135/71 (10/16 0452) SpO2:  [95 %-100 %] 100 % (10/16 0452) Weight:  [144.2 kg] 144.2 kg (10/15 1937)  Intake/Output from previous day:  Intake/Output Summary (Last 24 hours) at 09/02/2020 0708 Last data filed at 09/02/2020 0500 Gross per 24 hour  Intake 2046.75 ml  Output 1060 ml  Net 986.75 ml    Intake/Output this shift: No intake/output data recorded.  Labs: No results for input(s): HGB in the last 72 hours. No results for input(s): WBC, RBC, HCT, PLT in the last 72 hours. No results for input(s): NA, K, CL, CO2, BUN, CREATININE, GLUCOSE, CALCIUM in the last 72 hours. No results for input(s): LABPT, INR in the last 72 hours.   EXAM General - Patient is Alert and Oriented Extremity - Neurovascular intact Sensation intact distally Dorsiflexion/Plantar flexion intact Compartment soft Dressing/Incision - clean, dry, with Hemovac intact Motor Function - intact, moving foot and toes well on exam.  Able to straight leg raise independently.  Past Medical History:  Diagnosis Date  . Anemia   . Arthritis   . 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 Depression   . Dysrhythmia    a fib  . Edema of both lower legs   . GERD (gastroesophageal reflux disease)   . Hypertension   . Obesity   . OSA (obstructive sleep apnea)    uses C-pap machine   . Restless legs syndrome     Assessment/Plan: 1 Day  Post-Op Procedure(s) (LRB): COMPUTER ASSISTED TOTAL KNEE ARTHROPLASTY - RNFA (Right) Active Problems:   Total knee replacement status  Estimated body mass index is 48.34 kg/m as calculated from the following:   Height as of this encounter: 5\' 8"  (1.727 m).   Weight as of this encounter: 144.2 kg. Advance diet Up with therapy D/C IV fluids Discharge home with home health planned for tomorrow  DVT Prophylaxis - Lovenox, Foot Pumps and TED hose Weight-Bearing as tolerated to right leg  Reche Dixon, PA-C Orthopaedic Surgery 09/02/2020, 7:08 AM

## 2020-09-03 NOTE — Progress Notes (Signed)
Patient discharging home, IV removed. Instructions given to patient, verbalized understanding. Patient's wife will transport patient home.

## 2020-09-03 NOTE — Discharge Summary (Signed)
Physician Discharge Summary  Subjective: 2 Days Post-Op Procedure(s) (LRB): COMPUTER ASSISTED TOTAL KNEE ARTHROPLASTY - RNFA (Right) Patient reports pain as moderate.   Patient seen in rounds with Dr. Roland Rack. Patient is well, and has had no acute complaints or problems Patient is ready to go home with home health physical therapy.  Physician Discharge Summary  Patient ID: Benjamin [name] MRN: [id number] DOB/AGE: 1951-01-22 69 y.o.  Admit date: 09/01/2020 Discharge date: 09/03/2020  Admission Diagnoses:  Discharge Diagnoses:  Active Problems:   Total knee replacement status   Discharged Condition: fair  Hospital Course: Patient is postop day two from a right total knee replacement.  The patient is having slightly more pain this morning.  The patient ambulated 200 feet with physical therapy.  He has been able to do a straight leg raise independently.  Patient had a Hemovac removed this morning as well as a dressing change.  Patient is ready to go home with home health physical therapy.  The patient has to have a bowel movement before discharge.  Treatments: surgery:   Right total knee arthroplasty using computer-assisted navigation  SURGEON:  Marciano Sequin. M.D.  ANESTHESIA: general  ESTIMATED BLOOD LOSS: 50 mL  FLUIDS REPLACED: 1600 mL of crystalloid  TOURNIQUET TIME: 136 minutes  DRAINS: 2 medium Hemovac drains  SOFT TISSUE RELEASES: Anterior cruciate ligament, posterior cruciate ligament, deep medial collateral ligament, patellofemoral ligament, posterolateral corner  IMPLANTS UTILIZED: DePuy Attune size 7 posterior stabilized femoral component (cemented), size 8 rotating platform tibial component (cemented), 41 mm medialized dome patella (cemented), and a 6 mm stabilized rotating platform polyethylene insert.  Discharge Exam: Blood pressure 139/81, pulse (!) 105, temperature 99.4 F (37.4 C), resp. rate 16, height 5\' 8"  (1.727 m), weight (!) 144.2 kg,  SpO2 93 %.   Disposition: Discharge disposition: 01-Home or Self Care        Allergies as of 09/03/2020      Reactions   Pramipexole Itching      Medication List    TAKE these medications   acetaminophen 500 MG tablet Commonly known as: TYLENOL Take 500-1,000 mg by mouth every 6 (six) hours as needed (for pain.).   buPROPion 150 MG 24 hr tablet Commonly known as: WELLBUTRIN XL Take 300 mg by mouth daily.   cyclobenzaprine 10 MG tablet Commonly known as: FLEXERIL Take 10 mg by mouth 2 (two) times daily as needed for muscle spasms.   enoxaparin 40 MG/0.4ML injection Commonly known as: LOVENOX Inject 0.4 mLs (40 mg total) into the skin daily for 14 doses.   gabapentin 300 MG capsule Commonly known as: NEURONTIN Take 300 mg by mouth daily as needed (restless leg syndrome).   oxyCODONE 5 MG immediate release tablet Commonly known as: Oxy IR/ROXICODONE Take 1-2 tablets (5-10 mg total) by mouth every 4 (four) hours as needed for moderate pain (pain score 4-6).   oxymetazoline 0.05 % nasal spray Commonly known as: AFRIN Place 1 spray into both nostrils 2 (two) times daily as needed for congestion.   pantoprazole 40 MG tablet Commonly known as: PROTONIX Take 40 mg by mouth daily.   potassium chloride 10 MEQ tablet Commonly known as: KLOR-CON Take 10 mEq by mouth daily as needed (with lasix (fluid retention)).   tadalafil 5 MG tablet Commonly known as: CIALIS Take 5 mg by mouth daily as needed for erectile dysfunction.   testosterone cypionate 200 MG/ML injection Commonly known as: DEPOTESTOSTERONE CYPIONATE Inject 200 mg into the muscle every 14 (fourteen)  days.   torsemide 10 MG tablet Commonly known as: DEMADEX Take 40 mg by mouth 2 (two) times daily. Prn   traMADol 50 MG tablet Commonly known as: ULTRAM Take 1-2 tablets (50-100 mg total) by mouth every 4 (four) hours as needed for moderate pain.   traZODone 50 MG tablet Commonly known as:  DESYREL Take 50-100 mg by mouth at bedtime as needed for sleep.            Durable Medical Equipment  (From admission, onward)         Start     Ordered   09/01/20 1403  DME Walker rolling  Once       Question:  Patient needs a walker to treat with the following condition  Answer:  Total knee replacement status   09/01/20 1402   09/01/20 1403  DME Bedside commode  Once       Question:  Patient needs a bedside commode to treat with the following condition  Answer:  Total knee replacement status   09/01/20 1402          Follow-up Information    Watt Climes, PA On 09/13/2020.   Specialty: Physician Assistant Why: at 10:15am Contact information: [place] [address] [phone]        Dereck Leep, MD On 10/19/2020.   Specialty: Orthopedic Surgery Why: at 10:00am Contact information: [place] [address] [phone]               Signed: Prescott Parma, Sheelah Ritacco 09/03/2020, 6:56 AM   Objective: Vital signs in last 24 hours: Temp:  [98.3 F (36.8 C)-99.4 F (37.4 C)] 99.4 F (37.4 C) (10/17 0112) Pulse Rate:  [92-105] 105 (10/17 0112) Resp:  [16-18] 16 (10/17 0112) BP: (120-155)/(61-86) 139/81 (10/17 0112) SpO2:  [93 %-99 %] 93 % (10/17 0112)  Intake/Output from previous day:  Intake/Output Summary (Last 24 hours) at 09/03/2020 0656 Last data filed at 09/03/2020 0447 Gross per 24 hour  Intake 860.02 ml  Output 1615 ml  Net -754.98 ml    Intake/Output this shift: Total I/O In: -  Out: 735 [Urine:600; Drains:135]  Labs: No results for input(s): HGB in the last 72 hours. No results for input(s): WBC, RBC, HCT, PLT in the last 72 hours. No results for input(s): NA, K, CL, CO2, BUN, CREATININE, GLUCOSE, CALCIUM in the last 72 hours. No results for input(s): LABPT, INR in the last 72 hours.  EXAM: General - Patient is Alert and Oriented Extremity - Neurovascular intact Sensation  intact distally Dorsiflexion/Plantar flexion intact Compartment soft Incision - clean, dry, with the Hemovac removed with no complication.  There was dry hemorrhage from surgery on the previous dressing.  A new dressing was applied with no active bleeding. Motor Function -plantarflexion and dorsiflexion are intact.  Able to straight leg raise independently.  Assessment/Plan: 2 Days Post-Op Procedure(s) (LRB): COMPUTER ASSISTED TOTAL KNEE ARTHROPLASTY - RNFA (Right) Procedure(s) (LRB): COMPUTER ASSISTED TOTAL KNEE ARTHROPLASTY - RNFA (Right) Past Medical History:  Diagnosis Date  . Anemia   . Arthritis   . 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 Depression   . Dysrhythmia    a fib  . Edema of both lower legs   . GERD (gastroesophageal reflux disease)   . Hypertension   . Obesity   . OSA (obstructive sleep apnea)    uses C-pap machine   . Restless legs  syndrome    Active Problems:   Total knee replacement status  Estimated body mass index is 48.34 kg/m as calculated from the following:   Height as of this encounter: 5\' 8"  (1.727 m).   Weight as of this encounter: 144.2 kg. Advance diet Up with therapy D/C IV fluids Discharge home with home health Diet - Regular diet Follow up - in 2 weeks Activity - WBAT Disposition - Home Condition Upon Discharge - Stable DVT Prophylaxis - Lovenox  Reche Dixon, PA-C Orthopaedic Surgery 09/03/2020, 6:56 AM

## 2020-09-03 NOTE — Progress Notes (Signed)
  Subjective: 2 Days Post-Op Procedure(s) (LRB): COMPUTER ASSISTED TOTAL KNEE ARTHROPLASTY - RNFA (Right) Patient reports pain as mild and moderate.  More pain this morning. Patient is well, and has had no acute complaints or problems Plan is to go Home after hospital stay. Negative for chest pain and shortness of breath Fever: no Gastrointestinal: Negative for nausea and vomiting  Objective: Vital signs in last 24 hours: Temp:  [98.3 F (36.8 C)-99.4 F (37.4 C)] 99.4 F (37.4 C) (10/17 0112) Pulse Rate:  [92-105] 105 (10/17 0112) Resp:  [16-18] 16 (10/17 0112) BP: (120-155)/(61-86) 139/81 (10/17 0112) SpO2:  [93 %-99 %] 93 % (10/17 0112)  Intake/Output from previous day:  Intake/Output Summary (Last 24 hours) at 09/03/2020 0654 Last data filed at 09/03/2020 0447 Gross per 24 hour  Intake 860.02 ml  Output 1615 ml  Net -754.98 ml    Intake/Output this shift: Total I/O In: -  Out: 735 [Urine:600; Drains:135]  Labs: No results for input(s): HGB in the last 72 hours. No results for input(s): WBC, RBC, HCT, PLT in the last 72 hours. No results for input(s): NA, K, CL, CO2, BUN, CREATININE, GLUCOSE, CALCIUM in the last 72 hours. No results for input(s): LABPT, INR in the last 72 hours.   EXAM General - Patient is Alert and Oriented Extremity - Neurovascular intact Sensation intact distally Dorsiflexion/Plantar flexion intact Compartment soft Dressing/Incision - clean, dry, with Hemovac removed with no complication.  The Hemovac tubing was intact on removal.  There was significant postoperative hemorrhage left lower under the honeycomb dressing.  There was no bleeding once the bandage was changed. Motor Function - intact, moving foot and toes well on exam.  Able to straight leg raise independently.  Patient ambulated 200 feet with physical therapy.  Past Medical History:  Diagnosis Date  . Anemia   . Arthritis   . 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  Depression   . Dysrhythmia    a fib  . Edema of both lower legs   . GERD (gastroesophageal reflux disease)   . Hypertension   . Obesity   . OSA (obstructive sleep apnea)    uses C-pap machine   . Restless legs syndrome     Assessment/Plan: 2 Days Post-Op Procedure(s) (LRB): COMPUTER ASSISTED TOTAL KNEE ARTHROPLASTY - RNFA (Right) Active Problems:   Total knee replacement status  Estimated body mass index is 48.34 kg/m as calculated from the following:   Height as of this encounter: 5\' 8"  (1.727 m).   Weight as of this encounter: 144.2 kg. Advance diet Up with therapy D/C IV fluids Discharge home with home health planned for today.  Discussed home health versus outpatient physical therapy.  Patient agrees to home health physical therapy.  DVT Prophylaxis - Lovenox, Foot Pumps and TED hose Weight-Bearing as tolerated to right leg  Reche Dixon, PA-C Orthopaedic Surgery 09/03/2020, 6:54 AM

## 2020-09-03 NOTE — Progress Notes (Signed)
Physical Therapy Treatment Patient Details Name: Benjamin [name] MRN: [id number] DOB: 1951/09/08 Today's Date: 09/03/2020    History of Present Illness 69 y/o male s/p R TKA 10/15, PT eval POD0.  h/o L TKA 7 months ago.    PT Comments    Pt ready for session.  Reported pain medication about 45 minutes prior.Marland Kitchen  He does report increased pain today 8/10 in R knee.  Participated in exercises as described below.  He is able to stand and walk to bathroom to void then continue in hallway 33' with RW and min guard/supervision.  Pt reported increased pain today which is limiting his gait distance and overall knee flexion.  Pt remains with safe gait with no LOB or buckling and voices his comfort level with mobility at home including stairs.  He was able to complete lap yesterday and stair training with ease.   Follow Up Recommendations  Follow surgeon's recommendation for DC plan and follow-up therapies     Equipment Recommendations       Recommendations for Other Services       Precautions / Restrictions Precautions Precautions: Fall;Knee Restrictions Weight Bearing Restrictions: Yes RLE Weight Bearing: Weight bearing as tolerated    Mobility  Bed Mobility Overal bed mobility: Modified Independent                Transfers Overall transfer level: Modified independent                  Ambulation/Gait Ambulation/Gait assistance: Supervision Gait Distance (Feet): 70 Feet Assistive device: Rolling walker (2 wheeled) Gait Pattern/deviations: Step-through pattern Gait velocity: decreased   General Gait Details: increased pain today despite pain meds limiting mobility but remains safe with no buckling   Stairs         General stair comments: declined stair review.  reports being comfortable   Wheelchair Mobility    Modified Rankin (Stroke Patients Only)       Balance Overall balance assessment: Modified Independent                                           Cognition Arousal/Alertness: Awake/alert Behavior During Therapy: WFL for tasks assessed/performed Overall Cognitive Status: Within Functional Limits for tasks assessed                                 General Comments: Highly motivated      Exercises Total Joint Exercises Ankle Circles/Pumps: AROM;10 reps Quad Sets: Strengthening;10 reps Short Arc Quad: AROM;10 reps Heel Slides: AROM;5 reps (with lightly resisted leg extensions) Hip ABduction/ADduction: Strengthening;10 reps Straight Leg Raises: AROM;Strengthening;10 reps (3 second holds, light resistance at top) Knee Flexion: PROM;5 reps Goniometric ROM: 0-75 limited by pain today Other Exercises Other Exercises: to bathroom to void in standing    General Comments        Pertinent Vitals/Pain Pain Assessment: 0-10 Pain Score: 8  Pain Location: R knee Pain Descriptors / Indicators: Operative site guarding Pain Intervention(s): Premedicated before session;Monitored during session;Limited activity within patient's tolerance;Ice applied    Home Living                      Prior Function            PT Goals (current goals can now be found in the  care plan section) Progress towards PT goals: Progressing toward goals    Frequency    BID      PT Plan Current plan remains appropriate    Co-evaluation              AM-PAC PT "6 Clicks" Mobility   Outcome Measure  Help needed turning from your back to your side while in a flat bed without using bedrails?: None Help needed moving from lying on your back to sitting on the side of a flat bed without using bedrails?: None   Help needed standing up from a chair using your arms (e.g., wheelchair or bedside chair)?: None Help needed to walk in hospital room?: None Help needed climbing 3-5 steps with a railing? : None 6 Click Score: 20    End of Session Equipment Utilized During Treatment: Gait belt Activity  Tolerance: Patient limited by pain Patient left: in bed;with call bell/phone within reach Nurse Communication: Mobility status Pain - Right/Left: Right Pain - part of body: Knee     Time: 8485-9276 PT Time Calculation (min) (ACUTE ONLY): 14 min  Charges:  $Gait Training: 8-22 mins                    Chesley Noon, PTA 09/03/20, 10:03 AM

## 2020-09-04 ENCOUNTER — Encounter: Payer: Self-pay | Admitting: Orthopedic Surgery

## 2020-09-04 NOTE — Anesthesia Postprocedure Evaluation (Signed)
Anesthesia Post Note  Patient: Benjamin [name]  Procedure(s) Performed: COMPUTER ASSISTED TOTAL KNEE ARTHROPLASTY - RNFA (Right Knee)  Patient location during evaluation: PACU Anesthesia Type: General Level of consciousness: awake and alert Pain management: pain level controlled Vital Signs Assessment: post-procedure vital signs reviewed and stable Respiratory status: spontaneous breathing, nonlabored ventilation, respiratory function stable and patient connected to nasal cannula oxygen Cardiovascular status: blood pressure returned to baseline and stable Postop Assessment: no apparent nausea or vomiting Anesthetic complications: no   No complications documented.   Last Vitals:  Vitals:   09/03/20 0839 09/03/20 1028  BP:    Pulse:    Resp:    Temp: 37.5 C (!) 36.3 C  SpO2:      Last Pain:  Vitals:   09/03/20 1028  TempSrc: Axillary  PainSc:                  Molli Barrows

## 2020-09-08 ENCOUNTER — Ambulatory Visit
Admission: RE | Admit: 2020-09-08 | Discharge: 2020-09-08 | Disposition: A | Discharge status: Home / Self Care | Payer: Medicare Other | Source: Ambulatory Visit | Attending: Orthopedic Surgery | Admitting: Orthopedic Surgery

## 2020-09-08 ENCOUNTER — Other Ambulatory Visit: Payer: Self-pay | Admitting: Orthopedic Surgery

## 2020-09-08 ENCOUNTER — Other Ambulatory Visit: Payer: Self-pay

## 2020-09-08 DIAGNOSIS — Z96651 Presence of right artificial knee joint: Secondary | ICD-10-CM

## 2020-09-11 ENCOUNTER — Other Ambulatory Visit: Payer: Self-pay

## 2020-09-11 ENCOUNTER — Inpatient Hospital Stay: Payer: Medicare Other | Attending: Hematology and Oncology

## 2020-09-11 DIAGNOSIS — E611 Iron deficiency: Secondary | ICD-10-CM

## 2020-09-11 DIAGNOSIS — D509 Iron deficiency anemia, unspecified: Secondary | ICD-10-CM | POA: Diagnosis present

## 2020-09-11 LAB — CBC
HCT: 38 % — ABNORMAL LOW (ref 39.0–52.0)
Hemoglobin: 11.7 g/dL — ABNORMAL LOW (ref 13.0–17.0)
MCH: 25.5 pg — ABNORMAL LOW (ref 26.0–34.0)
MCHC: 30.8 g/dL (ref 30.0–36.0)
MCV: 82.8 fL (ref 80.0–100.0)
Platelets: 412 10*3/uL — ABNORMAL HIGH (ref 150–400)
RBC: 4.59 MIL/uL (ref 4.22–5.81)
RDW: 20.5 % — ABNORMAL HIGH (ref 11.5–15.5)
WBC: 8.1 10*3/uL (ref 4.0–10.5)
nRBC: 0 % (ref 0.0–0.2)

## 2020-09-11 NOTE — Progress Notes (Signed)
[facility]  [address] [place], [place] [address] Phone: [phone]  Fax: [phone]   Clinic Day:  09/12/2020  Referring physician: Gladstone Lighter, MD  Chief Complaint: Benjamin [name] is a 69 y.o. male with iron deficiency anemia who seen for 6 week assessment.  HPI: The patient was last seen in the hematology clinic on 08/01/2020. At that time, he was fatigued. He denied any bleeding. Exam was stable.  Hematocrit was 40.5, hemoglobin 12.7, and MCV 75.4 on 07/17/2020.  Ferritin 17 with an iron saturation of 8% and a TIBC of 368.  He has been on a trial of oral iron.  IV iron was discussed.  The patient received Venofer weekly x 3 (08/01/2020 - 08/14/2020).  The patient saw Dr. Lucky Cowboy on 08/25/2020 for leg swelling that began suddenly several weeks prior. His cardiologist had started him on a fluid pull which improved the swelling some. Right lower extremity duplex was negative for DVT. He was to begin wearing compression stockings and elevating his legs.   The patient underwent right total knee replacement on 09/01/2020 by Dr. Marry Guan.  Estimated blood loss was 50 ml.  During the interim, he has been "alright." He felt good after he received IV iron. About a week after his last infusion, he started feeling fatigued again. He is trying to eat less in an effort to lose weight.  The patient is still having right leg swelling, knee pain, and thigh pain after his knee replacement. He states that they put a tourniquet around his thigh that "crushed his muscle." He has a lot of bruising around the back of his knee. He denies bleeding of any kind, though he did use a Hemovac after his surgery that he reports "filled up almost three times." He did not have these problems after he had this surgery on his other leg.   Past Medical History:  Diagnosis Date  . Anemia   . Arthritis   . Cancer ([place])    skin face and back basal cell  . Depression   .  Dysrhythmia    a fib  . Edema of both lower legs   . GERD (gastroesophageal reflux disease)   . Hypertension   . Obesity   . OSA (obstructive sleep apnea)    uses C-pap machine   . Restless legs syndrome     Past Surgical History:  Procedure Laterality Date  . BACK SURGERY  01/2017 and 01/2018  . colonscopy      x 2  . KNEE ARTHROPLASTY Left 01/26/2020   Procedure: COMPUTER ASSISTED TOTAL KNEE ARTHROPLASTY;  Surgeon: Dereck Leep, MD;  Location: [facility] ORS;  Service: Orthopedics;  Laterality: Left;  . KNEE ARTHROPLASTY Right 09/01/2020   Procedure: COMPUTER ASSISTED TOTAL KNEE ARTHROPLASTY - RNFA;  Surgeon: Dereck Leep, MD;  Location: [facility] ORS;  Service: Orthopedics;  Laterality: Right;  . KNEE SURGERY Right    arthroscopy  . none    . TONSILLECTOMY  1964    Family History  Problem Relation Age of Onset  . CAD Neg Hx   . Diabetes Mellitus II Neg Hx     Social History:  reports that he has never smoked. He uses smokeless tobacco. He reports current alcohol use. He reports that he does not use drugs. The patient used dip and a can lasted him about a week. He has cut down on this. He denies alcohol use. He denies exposure to radiation or toxins. 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 His wife's name is Pam.  The patient is alone today.  Allergies:  Allergies  Allergen Reactions  . Pramipexole Itching    Current Medications: Current Outpatient Medications  Medication Sig Dispense Refill  . buPROPion (WELLBUTRIN XL) 150 MG 24 hr tablet Take 300 mg by mouth daily.   4  . cyclobenzaprine (FLEXERIL) 10 MG tablet Take 10 mg by mouth 2 (two) times daily as needed for muscle spasms.     Marland Kitchen enoxaparin (LOVENOX) 40 MG/0.4ML injection Inject 0.4 mLs (40 mg total) into the skin daily for 14 doses. 5.6 mL 0  . gabapentin (NEURONTIN) 300 MG capsule Take 300 mg by mouth daily as needed (restless leg syndrome).     Marland Kitchen oxyCODONE (OXY IR/ROXICODONE) 5 MG immediate release tablet Take  1-2 tablets (5-10 mg total) by mouth every 4 (four) hours as needed for moderate pain (pain score 4-6). 30 tablet 0  . oxymetazoline (AFRIN) 0.05 % nasal spray Place 1 spray into both nostrils 2 (two) times daily as needed for congestion.    . pantoprazole (PROTONIX) 40 MG tablet Take 40 mg by mouth daily.  5  . tadalafil (CIALIS) 5 MG tablet Take 5 mg by mouth daily as needed for erectile dysfunction.    Marland Kitchen testosterone cypionate (DEPOTESTOSTERONE CYPIONATE) 200 MG/ML injection Inject 200 mg into the muscle every 14 (fourteen) days.  2  . torsemide (DEMADEX) 10 MG tablet Take 40 mg by mouth 2 (two) times daily. Prn    . traMADol (ULTRAM) 50 MG tablet Take 1-2 tablets (50-100 mg total) by mouth every 4 (four) hours as needed for moderate pain. 30 tablet 0  . traZODone (DESYREL) 50 MG tablet Take 50-100 mg by mouth at bedtime as needed for sleep.     Marland Kitchen acetaminophen (TYLENOL) 500 MG tablet Take 500-1,000 mg by mouth every 6 (six) hours as needed (for pain.). (Patient not taking: Reported on 09/12/2020)    . potassium chloride (KLOR-CON) 10 MEQ tablet Take 10 mEq by mouth daily as needed (with lasix (fluid retention)).  (Patient not taking: Reported on 09/12/2020)     No current facility-administered medications for this visit.    Review of Systems  Constitutional: Positive for malaise/fatigue. Negative for chills, diaphoresis, fever and weight loss (4 lbs).       Feels "alright."  HENT: Negative for congestion, ear discharge, ear pain, hearing loss, nosebleeds, sinus pain, sore throat and tinnitus.   Eyes: Negative for blurred vision.  Respiratory: Negative for cough, hemoptysis, sputum production and shortness of breath.        Sleep apnea, uses CPAP.  Cardiovascular: Positive for leg swelling (right leg). Negative for chest pain and palpitations.  Gastrointestinal: Negative for abdominal pain, blood in stool, constipation, diarrhea, heartburn (on Protonix), melena, nausea and vomiting.        Eating less in an effort to lose weight.  Genitourinary: Negative for dysuria, frequency, hematuria and urgency.  Musculoskeletal: Positive for joint pain (right knee s/p knee replacement). Negative for back pain, myalgias and neck pain.  Skin: Negative for itching and rash.       Bruising around the right knee.  Neurological: Negative for dizziness, tingling, sensory change, weakness and headaches.       Restless legs.  Endo/Heme/Allergies: Does not bruise/bleed easily.  Psychiatric/Behavioral: Positive for depression. Negative for memory loss. The patient is not nervous/anxious and does not have insomnia (due to restless legs).   All other systems reviewed  and are negative.  Performance status (ECOG): 1-2  Vitals Blood pressure 122/66, pulse 75, temperature (!) 97.4 F (36.3 C), temperature source Tympanic, resp. rate 18, height 5\' 8"  (1.727 m), SpO2 98 %.   Physical Exam Vitals and nursing note reviewed.  Constitutional:      General: He is not in acute distress.    Appearance: He is not diaphoretic.     Comments: Walking stick by his side.  HENT:     Head: Normocephalic and atraumatic.     Comments: Short brown hair.    Mouth/Throat:     Mouth: Mucous membranes are moist.     Pharynx: Oropharynx is clear.  Eyes:     General: No scleral icterus.    Extraocular Movements: Extraocular movements intact.     Conjunctiva/sclera: Conjunctivae normal.     Pupils: Pupils are equal, round, and reactive to light.     Comments: Glasses.  Cardiovascular:     Rate and Rhythm: Normal rate and regular rhythm.     Heart sounds: Normal heart sounds. No murmur heard.   Pulmonary:     Effort: Pulmonary effort is normal. No respiratory distress.     Breath sounds: Normal breath sounds. No wheezing or rales.  Musculoskeletal:        General: Swelling (right knee swollen and bruised s/p knee replacement) present. No tenderness. Normal range of motion.     Cervical back: Normal range of  motion and neck supple.  Skin:    General: Skin is warm and dry.  Neurological:     Mental Status: He is alert and oriented to person, place, and time.  Psychiatric:        Thought Content: Thought content normal.        Judgment: Judgment normal.     09/12/2020: s/p right total knee replacement on 09/01/2020     Appointment on 09/11/2020  Component Date Value Ref Range Status  . Iron 09/11/2020 40* 45 - 182 ug/dL Final  . TIBC 09/11/2020 339  250 - 450 ug/dL Final  . Saturation Ratios 09/11/2020 12* 17.9 - 39.5 % Final  . UIBC 09/11/2020 299  ug/dL Final   Performed at [facility], [address] [place], [place] [address]  . Ferritin 09/11/2020 104  24 - 336 ng/mL Final   Performed at [facility], [address] [place], [place] [address]  . WBC 09/11/2020 8.1  4.0 - 10.5 K/uL Final  . RBC 09/11/2020 4.59  4.22 - 5.81 MIL/uL Final  . Hemoglobin 09/11/2020 11.7* 13.0 - 17.0 g/dL Final  . HCT 09/11/2020 38.0* 39 - 52 % Final  . MCV 09/11/2020 82.8  80.0 - 100.0 fL Final  . MCH 09/11/2020 25.5* 26.0 - 34.0 pg Final  . MCHC 09/11/2020 30.8  30.0 - 36.0 g/dL Final  . RDW 09/11/2020 20.5* 11.5 - 15.5 % Final  . Platelets 09/11/2020 412* 150 - 400 K/uL Final  . nRBC 09/11/2020 0.0  0.0 - 0.2 % Final   Performed at [facility], [address], [place],  [address]    Assessment:  Benjamin [name] is a 69 y.o. male with iron deficiency anemia.  He denies any melena, hematochezia, or hematuria.  Diet appear good.  He has taken oral iron and IV iron in the past.  CBC on 06/09/2020 revealed a hematocrit 40.8, hemoglobin 12.5, MCV 76.4, platelets 290,000, WBC 9,400.  B12 was 369 on 06/20/2020.  Labs on 07/17/2020 revealed a  hematocrit of 40.5, hemoglobin 12.7, platelets 272,000, WBC 7,300.  Ferritin was 17 (low) with an iron saturation of 8% (low) and a TIBC of 368.  Retic count was 2.2%.  Folate was 13.2. TSH was 2.719. Urinalysis was  normal.  The patient received Venofer x 3 (08/01/2020 - 08/14/2020).  Ferritin has been followed: 44 on 02/06/2016, 18 on 10/06/2018, 16 on 09/15/2019, 27 on 01/03/2020, 55 on 02/09/2020, 15 on 06/09/2020, 17 on 07/17/2020 and 104 on 09/11/2020.  Iron saturation has typically been 7-8%.  Colonoscopy in [place] was negative 2-3 years ago. Upper endoscopy 25 ago revealed esophageal bleeding due to acid reflux; he has been on Protonix.  Symptomatically, he feels "alright." He felt good after he received IV iron. About a week after his last infusion, he started feeling fatigued again.  He lost blood after his knee surgery.  Plan: 1.   Review labs from 09/11/2020. 2.   Iron deficiency  Hematocrit 40.5.  Hemoglobin 12.7.  MCV 75.4 on 07/17/2020.   Ferritin 17 with an iron saturation of 8% and a TIBC of 368.  Hematocrit 38.0.  Hemoglobin 11.7.  MCV 82.8 on 09/11/2020.   Ferritin 104 with an iron saturation of 12% and a TIBC of 339.  He experienced post operating bleeding s/p knee replacement.  Colonoscopy 2-3 years ago in [place] was negative (no report available).  He had an upper endoscopy 25 years ago with apparent esophagitis.  Patient receives Venofer if ferritin < 30.   Patient request Venofer for today (approved as iron saturation < 18%).   Ferritin may be falsely elevated secondary to inflammation (check sed rate at next lab draw).  Ferritin goal 100. 3.   Venofer today. 4.   RTC in 2 weeks for labs (CBC, ferritin, iron studies, sed rate). 5.   RTC in 4 weeks for MD assessment, labs (CBC, ferritin, iron studies- day before) and +/- Venofer.  I discussed the assessment and treatment plan with the patient.  The patient was provided an opportunity to ask questions and all were answered.  The patient agreed with the plan and demonstrated an understanding of the instructions.  The patient was advised to call back if the symptoms worsen or if the condition fails to improve as  anticipated.  I provided 17 minutes of face-to-face time during this this encounter and > 50% was spent counseling as documented under my assessment and plan.  An additional 8 minutes were spent reviewing his chart (Epic and Care Everywhere) including notes, labs, and imaging studies.    Friend Dorfman C. Mike Gip, MD, PhD    09/12/2020, 1:31 PM  I, Mirian Mo Tufford, am acting as Education administrator for [employer]. Mike Gip, MD, PhD.  I, Edwina Grossberg C. Mike Gip, MD, have reviewed the above documentation for accuracy and completeness, and I agree with the above.

## 2020-09-12 ENCOUNTER — Encounter: Payer: Self-pay | Admitting: Hematology and Oncology

## 2020-09-12 ENCOUNTER — Inpatient Hospital Stay: Payer: Medicare Other

## 2020-09-12 ENCOUNTER — Inpatient Hospital Stay (HOSPITAL_BASED_OUTPATIENT_CLINIC_OR_DEPARTMENT_OTHER): Payer: Medicare Other | Admitting: Hematology and Oncology

## 2020-09-12 ENCOUNTER — Other Ambulatory Visit: Payer: Self-pay

## 2020-09-12 VITALS — BP 131/57 | HR 80 | Resp 18

## 2020-09-12 VITALS — BP 122/66 | HR 75 | Temp 97.4°F | Resp 18 | Ht 68.0 in | Wt 316.4 lb

## 2020-09-12 DIAGNOSIS — E611 Iron deficiency: Secondary | ICD-10-CM

## 2020-09-12 DIAGNOSIS — D509 Iron deficiency anemia, unspecified: Secondary | ICD-10-CM | POA: Diagnosis not present

## 2020-09-12 DIAGNOSIS — E538 Deficiency of other specified B group vitamins: Secondary | ICD-10-CM

## 2020-09-12 LAB — IRON AND TIBC
Iron: 40 ug/dL — ABNORMAL LOW (ref 45–182)
Saturation Ratios: 12 % — ABNORMAL LOW (ref 17.9–39.5)
TIBC: 339 ug/dL (ref 250–450)
UIBC: 299 ug/dL

## 2020-09-12 LAB — FERRITIN: Ferritin: 104 ng/mL (ref 24–336)

## 2020-09-12 MED ORDER — SODIUM CHLORIDE 0.9 % IV SOLN: 200.0000 mg | Freq: Once | INTRAVENOUS | Status: DC

## 2020-09-12 MED ORDER — SODIUM CHLORIDE 0.9 % IV SOLN
Freq: Once | INTRAVENOUS | Status: AC
  Filled 2020-09-12: qty 250

## 2020-09-12 MED ORDER — IRON SUCROSE 20 MG/ML IV SOLN
200.0000 mg | Freq: Once | INTRAVENOUS | Status: AC
  Administered 2020-09-12: 200 mg via INTRAVENOUS
  Filled 2020-09-12: qty 10

## 2020-09-12 NOTE — Progress Notes (Signed)
Patient c/o constipation due to pain medication( the patient had a right knee replacement on 09/01/2020 and it is not doing well.

## 2020-09-26 ENCOUNTER — Inpatient Hospital Stay: Payer: Medicare Other | Attending: Hematology and Oncology

## 2020-09-26 DIAGNOSIS — D509 Iron deficiency anemia, unspecified: Secondary | ICD-10-CM | POA: Insufficient documentation

## 2020-09-27 ENCOUNTER — Inpatient Hospital Stay: Payer: Medicare Other

## 2020-09-27 ENCOUNTER — Other Ambulatory Visit: Payer: Self-pay

## 2020-09-27 DIAGNOSIS — D509 Iron deficiency anemia, unspecified: Secondary | ICD-10-CM | POA: Diagnosis present

## 2020-09-27 DIAGNOSIS — E611 Iron deficiency: Secondary | ICD-10-CM

## 2020-09-27 LAB — IRON AND TIBC
Iron: 27 ug/dL — ABNORMAL LOW (ref 45–182)
Saturation Ratios: 7 % — ABNORMAL LOW (ref 17.9–39.5)
TIBC: 381 ug/dL (ref 250–450)
UIBC: 354 ug/dL

## 2020-09-27 LAB — FERRITIN: Ferritin: 76 ng/mL (ref 24–336)

## 2020-09-27 LAB — CBC WITH DIFFERENTIAL/PLATELET
Abs Immature Granulocytes: 0.03 10*3/uL (ref 0.00–0.07)
Basophils Absolute: 0.1 10*3/uL (ref 0.0–0.1)
Basophils Relative: 1 %
Eosinophils Absolute: 0.2 10*3/uL (ref 0.0–0.5)
Eosinophils Relative: 2 %
HCT: 41.9 % (ref 39.0–52.0)
Hemoglobin: 13.2 g/dL (ref 13.0–17.0)
Immature Granulocytes: 0 %
Lymphocytes Relative: 20 %
Lymphs Abs: 1.6 10*3/uL (ref 0.7–4.0)
MCH: 25.5 pg — ABNORMAL LOW (ref 26.0–34.0)
MCHC: 31.5 g/dL (ref 30.0–36.0)
MCV: 80.9 fL (ref 80.0–100.0)
Monocytes Absolute: 0.8 10*3/uL (ref 0.1–1.0)
Monocytes Relative: 10 %
Neutro Abs: 5.5 10*3/uL (ref 1.7–7.7)
Neutrophils Relative %: 67 %
Platelets: 330 10*3/uL (ref 150–400)
RBC: 5.18 MIL/uL (ref 4.22–5.81)
RDW: 18.4 % — ABNORMAL HIGH (ref 11.5–15.5)
WBC: 8.2 10*3/uL (ref 4.0–10.5)
nRBC: 0 % (ref 0.0–0.2)

## 2020-09-27 LAB — SEDIMENTATION RATE: Sed Rate: 3 mm/hr (ref 0–20)

## 2020-09-28 ENCOUNTER — Telehealth: Payer: Self-pay

## 2020-09-28 NOTE — Telephone Encounter (Signed)
-----   Message from Lequita Asal, MD sent at 09/28/2020  7:05 AM EST ----- Regarding: Please call patient  Hemoglobin 13.2 (normal).  Ferritin 76.  Iron saturation 7% (low).  Any symptoms?  If still fatigued, Venofer x 1.  M ----- Message ----- From: Interface, Lab In [place] Sent: 09/27/2020   3:21 PM EST To: Lequita Asal, MD

## 2020-09-28 NOTE — Telephone Encounter (Signed)
Spoke with the patient to inform him of hgb 13.2, Ferritin 76 and Iron sat 7 %, Need Venofer x 1. The patient was understanding and agreeable.

## 2020-10-04 ENCOUNTER — Other Ambulatory Visit: Payer: Self-pay

## 2020-10-04 ENCOUNTER — Inpatient Hospital Stay: Payer: Medicare Other

## 2020-10-04 VITALS — BP 145/79 | HR 88 | Temp 97.7°F

## 2020-10-04 DIAGNOSIS — E611 Iron deficiency: Secondary | ICD-10-CM

## 2020-10-04 DIAGNOSIS — D509 Iron deficiency anemia, unspecified: Secondary | ICD-10-CM | POA: Diagnosis not present

## 2020-10-04 MED ORDER — SODIUM CHLORIDE 0.9 % IV SOLN
Freq: Once | INTRAVENOUS | Status: AC
  Filled 2020-10-04: qty 250

## 2020-10-04 MED ORDER — IRON SUCROSE 20 MG/ML IV SOLN
200.0000 mg | Freq: Once | INTRAVENOUS | Status: AC
  Administered 2020-10-04: 200 mg via INTRAVENOUS
  Filled 2020-10-04: qty 10

## 2020-10-04 MED ORDER — SODIUM CHLORIDE 0.9 % IV SOLN: 200.0000 mg | Freq: Once | INTRAVENOUS | Status: DC

## 2020-10-04 NOTE — Progress Notes (Signed)
Patient received prescribed treatment today and discharged from clinic in stable condition.

## 2020-10-09 ENCOUNTER — Other Ambulatory Visit: Payer: BLUE CROSS/BLUE SHIELD

## 2020-10-09 ENCOUNTER — Other Ambulatory Visit: Payer: Self-pay

## 2020-10-09 ENCOUNTER — Inpatient Hospital Stay: Payer: Medicare Other

## 2020-10-09 DIAGNOSIS — E611 Iron deficiency: Secondary | ICD-10-CM

## 2020-10-09 DIAGNOSIS — D509 Iron deficiency anemia, unspecified: Secondary | ICD-10-CM | POA: Diagnosis not present

## 2020-10-09 LAB — CBC WITH DIFFERENTIAL/PLATELET
Abs Immature Granulocytes: 0.03 10*3/uL (ref 0.00–0.07)
Basophils Absolute: 0.1 10*3/uL (ref 0.0–0.1)
Basophils Relative: 1 %
Eosinophils Absolute: 0.2 10*3/uL (ref 0.0–0.5)
Eosinophils Relative: 3 %
HCT: 44.9 % (ref 39.0–52.0)
Hemoglobin: 13.9 g/dL (ref 13.0–17.0)
Immature Granulocytes: 0 %
Lymphocytes Relative: 23 %
Lymphs Abs: 1.6 10*3/uL (ref 0.7–4.0)
MCH: 25.5 pg — ABNORMAL LOW (ref 26.0–34.0)
MCHC: 31 g/dL (ref 30.0–36.0)
MCV: 82.2 fL (ref 80.0–100.0)
Monocytes Absolute: 0.6 10*3/uL (ref 0.1–1.0)
Monocytes Relative: 8 %
Neutro Abs: 4.5 10*3/uL (ref 1.7–7.7)
Neutrophils Relative %: 65 %
Platelets: 306 10*3/uL (ref 150–400)
RBC: 5.46 MIL/uL (ref 4.22–5.81)
RDW: 19.3 % — ABNORMAL HIGH (ref 11.5–15.5)
WBC: 7 10*3/uL (ref 4.0–10.5)
nRBC: 0 % (ref 0.0–0.2)

## 2020-10-09 LAB — FERRITIN: Ferritin: 103 ng/mL (ref 24–336)

## 2020-10-09 LAB — IRON AND TIBC
Iron: 34 ug/dL — ABNORMAL LOW (ref 45–182)
Saturation Ratios: 10 % — ABNORMAL LOW (ref 17.9–39.5)
TIBC: 342 ug/dL (ref 250–450)
UIBC: 308 ug/dL

## 2020-10-09 NOTE — Progress Notes (Signed)
[facility]  [address] [place], [place] [address] Phone: [phone]  Fax: [phone]   Clinic Day:  10/10/2020  Referring physician: Gladstone Lighter, MD  Chief Complaint: Benjamin [name] is a 69 y.o. male with iron deficiency anemia who seen for 1 month assessment.  HPI: The patient was last seen in the hematology clinic on 09/12/2020. At that time, he felt "alright".  He felt good after he received IV iron. About a week after his last infusion, he started feeling fatigued again.  He lost blood after his knee surgery. Hematocrit was 38.0, hemoglobin 11.7, MCV 82.8, platelets 412,000, WBC 8,100. Ferritin was 104 with an iron saturation of 12% (low) and a TIBC of 339. He received Venofer.  Labs on 09/27/2020 revealed a hematocrit of 41.9, hemoglobin 13.2, platelets 330,000, WBC 8,200. Ferritin was 76 with an iron saturation of 7% (low) and a TIBC of 381. Sed rate was 3.  He received Venofer on 10/04/2020.  During the interim, he has been "pretty good." He denies ice pica, restless legs, blood in the stool, and black stools. He has had some discomfort in his chest but does not feet like something is sitting on his chest. He states that when he used to have restless legs, it would spread up to his groin and sometimes even his chest.  He stopped taking Protonix last week to see if he could stop using it but he felt some reflux this morning. He is planning to start taking it again.  He sees physical therapy after a right knee replacement in 08/2020.   Past Medical History:  Diagnosis Date  . Anemia   . Arthritis   . Cancer ([place])    skin face and back basal cell  . Depression   . Dysrhythmia    a fib  . Edema of both lower legs   . GERD (gastroesophageal reflux disease)   . Hypertension   . Obesity   . OSA (obstructive sleep apnea)    uses C-pap machine   . Restless legs syndrome     Past Surgical History:  Procedure Laterality Date   . BACK SURGERY  01/2017 and 01/2018  . colonscopy      x 2  . KNEE ARTHROPLASTY Left 01/26/2020   Procedure: COMPUTER ASSISTED TOTAL KNEE ARTHROPLASTY;  Surgeon: Dereck Leep, MD;  Location: [facility] ORS;  Service: Orthopedics;  Laterality: Left;  . KNEE ARTHROPLASTY Right 09/01/2020   Procedure: COMPUTER ASSISTED TOTAL KNEE ARTHROPLASTY - RNFA;  Surgeon: Dereck Leep, MD;  Location: [facility] ORS;  Service: Orthopedics;  Laterality: Right;  . KNEE SURGERY Right    arthroscopy  . none    . TONSILLECTOMY  1964    Family History  Problem Relation Age of Onset  . CAD Neg Hx   . Diabetes Mellitus II Neg Hx     Social History:  reports that he has never smoked. He uses smokeless tobacco. He reports current alcohol use. He reports that he does not use drugs. The patient used dip and a can lasted him about a week. He has cut down on this. He denies alcohol use. He denies exposure to radiation or toxins. 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 His wife's name is Benjamin [name].  The patient is alone today.  Allergies:  Allergies  Allergen Reactions  . Pramipexole Itching    Current Medications: Current Outpatient Medications  Medication Sig Dispense Refill  . acetaminophen (TYLENOL) 500 MG  tablet Take 500-1,000 mg by mouth every 6 (six) hours as needed (for pain.).     Marland Kitchen buPROPion (WELLBUTRIN XL) 150 MG 24 hr tablet Take 300 mg by mouth daily.   4  . celecoxib (CELEBREX) 200 MG capsule Take by mouth.    . cyclobenzaprine (FLEXERIL) 10 MG tablet Take 10 mg by mouth 2 (two) times daily as needed for muscle spasms.     . DULoxetine (CYMBALTA) 30 MG capsule Take by mouth.    . gabapentin (NEURONTIN) 300 MG capsule Take 300 mg by mouth daily as needed (restless leg syndrome).     Marland Kitchen oxyCODONE (OXY IR/ROXICODONE) 5 MG immediate release tablet Take 1-2 tablets (5-10 mg total) by mouth every 4 (four) hours as needed for moderate pain (pain score 4-6). 30 tablet 0  . oxymetazoline (AFRIN) 0.05 % nasal spray  Place 1 spray into both nostrils 2 (two) times daily as needed for congestion.    . pantoprazole (PROTONIX) 40 MG tablet Take 40 mg by mouth daily.  5  . tadalafil (CIALIS) 5 MG tablet Take 5 mg by mouth daily as needed for erectile dysfunction.    Marland Kitchen testosterone cypionate (DEPOTESTOSTERONE CYPIONATE) 200 MG/ML injection Inject 200 mg into the muscle every 14 (fourteen) days.  2  . traZODone (DESYREL) 50 MG tablet Take 50-100 mg by mouth at bedtime as needed for sleep.     . potassium chloride (KLOR-CON) 10 MEQ tablet Take 10 mEq by mouth daily as needed (with lasix (fluid retention)).  (Patient not taking: Reported on 09/12/2020)    . torsemide (DEMADEX) 10 MG tablet Take 40 mg by mouth 2 (two) times daily. Prn (Patient not taking: Reported on 10/10/2020)    . traMADol (ULTRAM) 50 MG tablet Take 1-2 tablets (50-100 mg total) by mouth every 4 (four) hours as needed for moderate pain. (Patient not taking: Reported on 10/10/2020) 30 tablet 0   No current facility-administered medications for this visit.    Review of Systems  Constitutional: Positive for weight loss (7 lbs, intentional). Negative for chills, diaphoresis, fever and malaise/fatigue.       Feels "pretty good."  HENT: Negative for congestion, ear discharge, ear pain, hearing loss, nosebleeds, sinus pain, sore throat and tinnitus.   Eyes: Negative for blurred vision.  Respiratory: Negative for cough, hemoptysis, sputum production and shortness of breath.        Sleep apnea, uses CPAP.  Cardiovascular: Positive for chest pain (discomfort). Negative for palpitations and leg swelling.  Gastrointestinal: Positive for heartburn. Negative for abdominal pain, blood in stool, constipation, diarrhea, melena, nausea and vomiting.  Genitourinary: Negative for dysuria, frequency, hematuria and urgency.  Musculoskeletal: Positive for joint pain (right knee s/p knee replacement). Negative for back pain, myalgias and neck pain.  Skin: Negative for  itching and rash.  Neurological: Negative for dizziness, tingling, sensory change, weakness and headaches.       No restless legs.  Endo/Heme/Allergies: Does not bruise/bleed easily.  Psychiatric/Behavioral: Positive for depression. Negative for memory loss. The patient is not nervous/anxious and does not have insomnia.   All other systems reviewed and are negative.  Performance status (ECOG): 1  Vitals Blood pressure 131/78, pulse 93, temperature (!) 97.4 F (36.3 C), resp. rate 18, weight (!) 309 lb 8.4 oz (140.4 kg).   Physical Exam Vitals and nursing note reviewed.  Constitutional:      General: He is not in acute distress.    Appearance: He is not diaphoretic.  HENT:  Head: Normocephalic and atraumatic.     Comments: Short brown hair.    Mouth/Throat:     Mouth: Mucous membranes are moist.     Pharynx: Oropharynx is clear.  Eyes:     General: No scleral icterus.    Extraocular Movements: Extraocular movements intact.     Conjunctiva/sclera: Conjunctivae normal.     Pupils: Pupils are equal, round, and reactive to light.     Comments: Glasses.  Brown eyes.  Cardiovascular:     Rate and Rhythm: Normal rate and regular rhythm.     Heart sounds: Normal heart sounds. No murmur heard.   Pulmonary:     Effort: Pulmonary effort is normal. No respiratory distress.     Breath sounds: Normal breath sounds. No wheezing or rales.  Musculoskeletal:        General: No swelling or tenderness. Normal range of motion.     Cervical back: Normal range of motion and neck supple.  Skin:    General: Skin is warm and dry.  Neurological:     Mental Status: He is alert and oriented to person, place, and time.  Psychiatric:        Thought Content: Thought content normal.        Judgment: Judgment normal.     09/12/2020: s/p right total knee replacement on 09/01/2020     Appointment on 10/09/2020  Component Date Value Ref Range Status  . Iron 10/09/2020 34* 45 - 182 ug/dL Final   . TIBC 10/09/2020 342  250 - 450 ug/dL Final  . Saturation Ratios 10/09/2020 10* 17.9 - 39.5 % Final  . UIBC 10/09/2020 308  ug/dL Final   Performed at [facility], [address], [place], [place] [address]  . Ferritin 10/09/2020 103  24 - 336 ng/mL Final   Performed at [facility], [address] [place], [place] [address]  . WBC 10/09/2020 7.0  4.0 - 10.5 K/uL Final  . RBC 10/09/2020 5.46  4.22 - 5.81 MIL/uL Final  . Hemoglobin 10/09/2020 13.9  13.0 - 17.0 g/dL Final  . HCT 10/09/2020 44.9  39 - 52 % Final  . MCV 10/09/2020 82.2  80.0 - 100.0 fL Final  . MCH 10/09/2020 25.5* 26.0 - 34.0 pg Final  . MCHC 10/09/2020 31.0  30.0 - 36.0 g/dL Final  . RDW 10/09/2020 19.3* 11.5 - 15.5 % Final  . Platelets 10/09/2020 306  150 - 400 K/uL Final  . nRBC 10/09/2020 0.0  0.0 - 0.2 % Final  . Neutrophils Relative % 10/09/2020 65  % Final  . Neutro Abs 10/09/2020 4.5  1.7 - 7.7 K/uL Final  . Lymphocytes Relative 10/09/2020 23  % Final  . Lymphs Abs 10/09/2020 1.6  0.7 - 4.0 K/uL Final  . Monocytes Relative 10/09/2020 8  % Final  . Monocytes Absolute 10/09/2020 0.6  0.1 - 1.0 K/uL Final  . Eosinophils Relative 10/09/2020 3  % Final  . Eosinophils Absolute 10/09/2020 0.2  0.0 - 0.5 K/uL Final  . Basophils Relative 10/09/2020 1  % Final  . Basophils Absolute 10/09/2020 0.1  0.0 - 0.1 K/uL Final  . Immature Granulocytes 10/09/2020 0  % Final  . Abs Immature Granulocytes 10/09/2020 0.03  0.00 - 0.07 K/uL Final   Performed at [facility], [address], [place], [place] [address]    Assessment:  Benjamin [name] is a 69 y.o. male with iron deficiency anemia.  He denies any melena, hematochezia, or hematuria.  Diet appear good.  He has taken oral iron and IV iron in the past.  CBC on 06/09/2020 revealed a hematocrit 40.8, hemoglobin 12.5, MCV 76.4, platelets 290,000, WBC 9,400.  B12 was 369 on 06/20/2020.  Labs on 07/17/2020 revealed a hematocrit of 40.5,  hemoglobin 12.7, platelets 272,000, WBC 7,300.  Ferritin was 17 (low) with an iron saturation of 8% (low) and a TIBC of 368.  Retic count was 2.2%.  Folate was 13.2. TSH was 2.719. Urinalysis was normal.  The patient received Venofer x 3 (08/01/2020 - 08/14/2020), 09/12/2020 and 09/27/2020.  Ferritin has been followed: 44 on 02/06/2016, 18 on 10/06/2018, 16 on 09/15/2019, 27 on 01/03/2020, 55 on 02/09/2020, 15 on 06/09/2020, 17 on 07/17/2020, 104 on 09/11/2020, 76 on 09/27/2020, and 103 on 10/09/2020.  Iron saturation has typically been 7-8%.  Colonoscopy in [place] was negative 2-3 years ago. Upper endoscopy 25 ago revealed esophageal bleeding due to acid reflux; he has been on Protonix.  Symptomatically, he feels "pretty good." He denies ice pica, restless legs, blood in the stool, and black stools.  Exam is stable.  Plan: 1.   Review labs from 10/09/2020. 2.   Iron deficiency  Hematocrit 40.5.  Hemoglobin 12.7.  MCV 75.4 on 07/17/2020.   Ferritin 17 with an iron saturation of 8% and a TIBC of 368.  Hematocrit 44.9.  Hemoglobin 13.9.  MCV 82.2 on 10/09/2020.   Ferritin 103 with an iron saturation of 10% and a TIBC of 342.  Colonoscopy 2-3 years ago in [place] was negative (no report available).  He had an upper endoscopy 25 years ago with apparent esophagitis.  Patient receives Venofer if ferritin < 30.  Ferritin goal 100.  No Venofer today. 3.   RTC in 3 months for labs (CBC, ferritin, iron studies, CRP). 4.   RTC in 6 months for MD assessment, labs (CBC, ferritin, iron studies- day before) and +/- Venofer.  I discussed the assessment and treatment plan with the patient.  The patient was provided an opportunity to ask questions and all were answered.  The patient agreed with the plan and demonstrated an understanding of the instructions.  The patient was advised to call back if the symptoms worsen or if the condition fails to improve as anticipated.   Craven Crean C. Mike Gip, MD, PhD     10/10/2020, 2:05 PM  I, Mirian Mo Tufford, am acting as Education administrator for [employer]. Mike Gip, MD, PhD.  I, Skyann Ganim C. Mike Gip, MD, have reviewed the above documentation for accuracy and completeness, and I agree with the above.

## 2020-10-10 ENCOUNTER — Inpatient Hospital Stay: Payer: Medicare Other

## 2020-10-10 ENCOUNTER — Encounter: Payer: Self-pay | Admitting: Hematology and Oncology

## 2020-10-10 ENCOUNTER — Inpatient Hospital Stay (HOSPITAL_BASED_OUTPATIENT_CLINIC_OR_DEPARTMENT_OTHER): Payer: Medicare Other | Admitting: Hematology and Oncology

## 2020-10-10 VITALS — BP 131/78 | HR 93 | Temp 97.4°F | Resp 18 | Wt 309.5 lb

## 2020-10-10 DIAGNOSIS — D509 Iron deficiency anemia, unspecified: Secondary | ICD-10-CM | POA: Diagnosis not present

## 2020-10-10 DIAGNOSIS — E611 Iron deficiency: Secondary | ICD-10-CM | POA: Diagnosis not present

## 2020-10-10 NOTE — Progress Notes (Signed)
Pt here for follow up. No new concerns voiced.   

## 2020-10-16 ENCOUNTER — Encounter: Payer: Self-pay | Admitting: Orthopedic Surgery

## 2020-10-25 ENCOUNTER — Inpatient Hospital Stay: Payer: Medicare Other | Attending: Hematology and Oncology

## 2020-10-25 ENCOUNTER — Other Ambulatory Visit: Payer: Self-pay

## 2020-10-25 DIAGNOSIS — E611 Iron deficiency: Secondary | ICD-10-CM

## 2020-10-25 DIAGNOSIS — D509 Iron deficiency anemia, unspecified: Secondary | ICD-10-CM | POA: Diagnosis not present

## 2020-10-25 LAB — COMPREHENSIVE METABOLIC PANEL
ALT: 16 U/L (ref 0–44)
AST: 21 U/L (ref 15–41)
Albumin: 4 g/dL (ref 3.5–5.0)
Alkaline Phosphatase: 62 U/L (ref 38–126)
Anion gap: 8 (ref 5–15)
BUN: 11 mg/dL (ref 8–23)
CO2: 25 mmol/L (ref 22–32)
Calcium: 8.8 mg/dL — ABNORMAL LOW (ref 8.9–10.3)
Chloride: 104 mmol/L (ref 98–111)
Creatinine, Ser: 1.15 mg/dL (ref 0.61–1.24)
GFR, Estimated: >60 mL/min (ref >60)
Glucose, Bld: 114 mg/dL — ABNORMAL HIGH (ref 70–99)
Potassium: 4.2 mmol/L (ref 3.5–5.1)
Sodium: 137 mmol/L (ref 135–145)
Total Bilirubin: 0.5 mg/dL (ref 0.3–1.2)
Total Protein: 6.9 g/dL (ref 6.5–8.1)

## 2020-10-25 LAB — CBC WITH DIFFERENTIAL/PLATELET
Abs Immature Granulocytes: 0.03 10*3/uL (ref 0.00–0.07)
Basophils Absolute: 0.1 10*3/uL (ref 0.0–0.1)
Basophils Relative: 1 %
Eosinophils Absolute: 0.2 10*3/uL (ref 0.0–0.5)
Eosinophils Relative: 2 %
HCT: 45.3 % (ref 39.0–52.0)
Hemoglobin: 14.3 g/dL (ref 13.0–17.0)
Immature Granulocytes: 0 %
Lymphocytes Relative: 25 %
Lymphs Abs: 1.8 10*3/uL (ref 0.7–4.0)
MCH: 25.4 pg — ABNORMAL LOW (ref 26.0–34.0)
MCHC: 31.6 g/dL (ref 30.0–36.0)
MCV: 80.6 fL (ref 80.0–100.0)
Monocytes Absolute: 0.6 10*3/uL (ref 0.1–1.0)
Monocytes Relative: 8 %
Neutro Abs: 4.5 10*3/uL (ref 1.7–7.7)
Neutrophils Relative %: 64 %
Platelets: 349 10*3/uL (ref 150–400)
RBC: 5.62 MIL/uL (ref 4.22–5.81)
RDW: 17.9 % — ABNORMAL HIGH (ref 11.5–15.5)
WBC: 7 10*3/uL (ref 4.0–10.5)
nRBC: 0 % (ref 0.0–0.2)

## 2020-10-25 LAB — IRON AND TIBC
Iron: 32 ug/dL — ABNORMAL LOW (ref 45–182)
Saturation Ratios: 9 % — ABNORMAL LOW (ref 17.9–39.5)
TIBC: 368 ug/dL (ref 250–450)
UIBC: 336 ug/dL

## 2020-10-25 LAB — FERRITIN: Ferritin: 34 ng/mL (ref 24–336)

## 2020-10-30 ENCOUNTER — Telehealth: Payer: Self-pay | Admitting: *Deleted

## 2020-10-30 NOTE — Telephone Encounter (Signed)
  Please call patient.  Hemoglobin excellent.  No IV iron at this time.  M

## 2020-10-30 NOTE — Telephone Encounter (Signed)
Wife called answering service requesting a return call with results form labs last week.  CBC with Differential Order: [id number]  Status: Final result   Visible to patient: No (inaccessible in [place])   Next appt: 01/09/2021 at 02:30 PM in Oncology ([facility])   Dx: Iron deficiency   0 Result Notes  Component Ref Range & Units 5 d ago  (10/25/20) 3 wk ago  (10/09/20) 1 mo ago  (09/27/20) 1 mo ago  (09/11/20) 2 mo ago  (08/29/20) 3 mo ago  (07/17/20) 4 mo ago  (06/05/20)  WBC 4.0 - 10.5 K/uL 7.0  7.0  8.2  8.1  7.4  7.3  8.6   RBC 4.22 - 5.81 MIL/uL 5.62  5.46  5.18  4.59  5.60  5.37  5.30   Hemoglobin 13.0 - 17.0 g/dL 14.3  13.9  13.2  11.7Low  14.2  12.7Low  12.1Low   HCT 39.0 - 52.0 % 45.3  44.9  41.9  38.0Low  45.0  40.5  39.3   MCV 80.0 - 100.0 fL 80.6  82.2  80.9  82.8  80.4  75.4Low  74.2Low   MCH 26.0 - 34.0 pg 25.4Low  25.5Low  25.5Low  25.5Low  25.4Low  23.6Low  22.8Low   MCHC 30.0 - 36.0 g/dL 31.6  31.0  31.5  30.8  31.6  31.4  30.8   RDW 11.5 - 15.5 % 17.9High  19.3High  18.4High  20.5High  21.4High  21.1High  17.9High   Platelets 150 - 400 K/uL 349  306  330  412High  275  272  306   nRBC 0.0 - 0.2 % 0.0  0.0  0.0  0.0 CM  0.0 CM  0.0  0.0 CM   Neutrophils Relative % % 64  65  67    63    Neutro Abs 1.7 - 7.7 K/uL 4.5  4.5  5.5    4.7    Lymphocytes Relative % 25  23  20    24     Lymphs Abs 0.7 - 4.0 K/uL 1.8  1.6  1.6    1.7    Monocytes Relative % 8  8  10    9     Monocytes Absolute 0.1 - 1.0 K/uL 0.6  0.6  0.8    0.6    Eosinophils Relative % 2  3  2    3     Eosinophils Absolute 0.0 - 0.5 K/uL 0.2  0.2  0.2    0.2    Basophils Relative % 1  1  1    1     Basophils Absolute 0.0 - 0.1 K/uL 0.1  0.1  0.1    0.0    Immature Granulocytes % 0  0  0    0    Abs Immature Granulocytes 0.00 - 0.07 K/uL 0.03  0.03 CM  0.03 CM    0.03 CM    Comment: Performed at [facility], [address], [place], [place]  [address]  Resulting Agency  [facility] [place] [facility] [place] [facility] [place] [facility] [place] [facility] [place] [facility]         Specimen Collected: 10/25/20 14:42 Last Resulted: 10/25/20 14:51     Lab Flowsheet   Order Details   View Encounter   Lab and Collection Details   Routing   Result History     CM=Additional comments     Result Care Coordination  Patient Communication  Add Comments Not seen Back to Top        Other Results from 10/25/2020   Contains abnormal dataComprehensive metabolic panel Order: [id number]  Status: Final result   Visible to patient: No (inaccessible in [place])   Next appt: 01/09/2021 at 02:30 PM in Oncology ([facility])   Dx: Iron deficiency   0 Result Notes  Component Ref Range & Units 5 d ago  (10/25/20) 2 mo ago  (08/29/20) 4 mo ago  (06/05/20) 4 mo ago  (06/05/20) 9 mo ago  (01/24/20) 1 yr ago  (04/11/19) 1 yr ago  (04/09/19)  Sodium 135 - 145 mmol/L 137  137   137  140  139  135   Potassium 3.5 - 5.1 mmol/L 4.2  4.4   4.2  3.9  3.9  3.9   Chloride 98 - 111 mmol/L 104  103   105  105  111  109   CO2 22 - 32 mmol/L 25  24   25  31  24   17Low   Glucose, Bld 70 - 99 mg/dL 114High  103High CM   121High CM  92 CM  106High  174High   Comment: Glucose reference range applies only to samples taken after fasting for at least 8 hours.  BUN 8 - 23 mg/dL 11  15   16  17  12  15    Creatinine, Ser 0.61 - 1.24 mg/dL 1.15  1.12   1.21  1.19  1.12  1.07   Calcium 8.9 - 10.3 mg/dL 8.8Low  9.0   8.9  8.9  8.3Low  7.5Low   Total Protein 6.5 - 8.1 g/dL 6.9  6.7  6.4Low   6.2Low     Albumin 3.5 - 5.0 g/dL 4.0  3.9  3.6   3.7     AST 15 - 41 U/L 21  20  21   20      ALT 0 - 44 U/L 16  18  16   20      Alkaline Phosphatase 38 - 126 U/L 62  56  62   69     Total Bilirubin 0.3 - 1.2 mg/dL 0.5  1.0  0.5   0.6     GFR, Estimated >60 mL/min >60  >60        Comment: (NOTE)  Calculated using the CKD-EPI Creatinine Equation (2021)    Anion gap 5 - 15 8  10  CM   7 CM  4Low CM  4Low CM  9 CM   Comment: Performed at [facility], [address], [place], [place] [address]  Resulting Agency  [facility] [place] [facility] [place] [facility] [place] [facility] [place] [facility] [place] [facility] [place] [facility]         Specimen Collected: 10/25/20 14:42 Last Resulted: 10/25/20 15:09     Lab Flowsheet   Order Details   View Encounter   Lab and Collection Details   Routing   Result History     CM=Additional comments     Result Care Coordination   Patient Communication  Add Comments Not seen Back to Top         Contains abnormal dataIron and TIBC Order: [id number]  Status: Final result    Visible to patient: No (inaccessible in [place])    Next appt: 01/09/2021 at 02:30 PM in Oncology ([facility])    Dx: Iron deficiency    0 Result Notes  Component Ref Range & Units 5 d ago  (10/25/20) 3 wk ago  (10/09/20) 1 mo ago  (09/27/20) 1 mo ago  (09/11/20) 3 mo ago  (07/17/20)  Iron 45 - 182 ug/dL 32Low  34Low  27Low  40Low  30Low   TIBC 250 - 450 ug/dL 368  342  381  339  368   Saturation Ratios 17.9 - 39.5 % 9Low  10Low  7Low  12Low  8Low   UIBC ug/dL 336  308 CM  354 CM  299 CM  338 CM   Comment: Performed at [facility], [address] [place], [place] [address]  Resulting Agency  [facility] [place] [facility] [place] [facility] [place] [facility]          Specimen Collected: 10/25/20 14:42 Last Resulted: 10/25/20 19:25     Lab Flowsheet    Order Details    View Encounter    Lab and Collection Details    Routing    Result History      CM=Additional comments     Result Care Coordination   Patient Communication  Add Comments Not seen Back to Top          Ferritin Order: [id number]  Status: Final result    Visible to patient: No (inaccessible in MyChart)    Next appt: 01/09/2021 at 02:30 PM in Oncology ([facility])    Dx: Iron  deficiency    0 Result Notes   Component Ref Range & Units 5 d ago  (10/25/20) 3 wk ago  (10/09/20) 1 mo ago  (09/27/20) 1 mo ago  (09/11/20) 3 mo ago  (07/17/20)  Ferritin 24 - 336 ng/mL 34  103 CM  76 CM  104 CM  17Low CM   Comment: Performed at [facility], [address] [place], [place] [address]  Resulting Agency  [facility] CL[facility] LAB [place] [facility] [place] [facility] LAB          Specimen Collected: 10/25/20 14:42 Last Resulted: 10/25/20 19:25

## 2020-10-30 NOTE — Telephone Encounter (Signed)
Call returned to patient and left message not identifying patient of doctor response

## 2020-10-30 NOTE — Telephone Encounter (Signed)
Patient called asking about lab results and if he needs an Iron infusion or not. Please advise.  Ferritin Order: [id number]  Status: Final result   Visible to patient: No (inaccessible in [place])   Next appt: 01/09/2021 at 02:30 PM in Oncology ([facility])   Dx: Iron deficiency   0 Result Notes  Component Ref Range & Units 5 d ago  (10/25/20) 3 wk ago  (10/09/20) 1 mo ago  (09/27/20) 1 mo ago  (09/11/20) 3 mo ago  (07/17/20)  Ferritin 24 - 336 ng/mL 34  103 CM  76 CM  104 CM  17Low CM   Comment: Performed at [facility], [address] [place], [place] [address]  Resulting Agency  [facility] [place] [facility] [place] [facility] [place] [facility]         Specimen Collected: 10/25/20 14:42 Last Resulted: 10/25/20 19:25     Lab Flowsheet   Order Details   View Encounter   Lab and Collection Details   Routing   Result History     CM=Additional comments     Result Care Coordination   Patient Communication  Add Comments Not seen Back to Top        Other Results from 10/25/2020   Contains abnormal dataComprehensive metabolic panel Order: [id number]  Status: Final result   Visible to patient: No (inaccessible in [place])   Next appt: 01/09/2021 at 02:30 PM in Oncology ([facility])   Dx: Iron deficiency   0 Result Notes  Component Ref Range & Units 5 d ago  (10/25/20) 2 mo ago  (08/29/20) 4 mo ago  (06/05/20) 4 mo ago  (06/05/20) 9 mo ago  (01/24/20) 1 yr ago  (04/11/19) 1 yr ago  (04/09/19)  Sodium 135 - 145 mmol/L 137  137   137  140  139  135   Potassium 3.5 - 5.1 mmol/L 4.2  4.4   4.2  3.9  3.9  3.9   Chloride 98 - 111 mmol/L 104  103   105  105  111  109   CO2 22 - 32 mmol/L 25  24   25  31  24   17Low   Glucose, Bld 70 - 99 mg/dL 114High  103High CM   121High CM  92 CM  106High  174High   Comment: Glucose reference range applies only to samples taken after fasting for at least 8 hours.  BUN 8 - 23 mg/dL 11  15   16  17   12  15    Creatinine, Ser 0.61 - 1.24 mg/dL 1.15  1.12   1.21  1.19  1.12  1.07   Calcium 8.9 - 10.3 mg/dL 8.8Low  9.0   8.9  8.9  8.3Low  7.5Low   Total Protein 6.5 - 8.1 g/dL 6.9  6.7  6.4Low   6.2Low     Albumin 3.5 - 5.0 g/dL 4.0  3.9  3.6   3.7     AST 15 - 41 U/L 21  20  21   20      ALT 0 - 44 U/L 16  18  16   20      Alkaline Phosphatase 38 - 126 U/L 62  56  62   69     Total Bilirubin 0.3 - 1.2 mg/dL 0.5  1.0  0.5   0.6     GFR, Estimated >60 mL/min >60  >60        Comment: (  NOTE)  Calculated using the CKD-EPI Creatinine Equation (2021)   Anion gap 5 - 15 8  10  CM   7 CM  4Low CM  4Low CM  9 CM   Comment: Performed at [facility], [address] [place], [place] [address]  Resulting Agency  [facility] CL[facility] LAB [place] [facility] [place] [facility] [place] [facility] [place] [facility] [place] [facility] [place] [facility]         Specimen Collected: 10/25/20 14:42 Last Resulted: 10/25/20 15:09     Lab Flowsheet   Order Details   View Encounter   Lab and Collection Details   Routing   Result History     CM=Additional comments     Result Care Coordination   Patient Communication  Add Comments Not seen Back to Top         Contains abnormal dataIron and TIBC Order: [id number]  Status: Final result    Visible to patient: No (inaccessible in MyChart)    Next appt: 01/09/2021 at 02:30 PM in Oncology ([facility])    Dx: Iron deficiency    0 Result Notes   Component Ref Range & Units 5 d ago  (10/25/20) 3 wk ago  (10/09/20) 1 mo ago  (09/27/20) 1 mo ago  (09/11/20) 3 mo ago  (07/17/20)  Iron 45 - 182 ug/dL 32Low  34Low  27Low  40Low  30Low   TIBC 250 - 450 ug/dL 368  342  381  339  368   Saturation Ratios 17.9 - 39.5 % 9Low  10Low  7Low  12Low  8Low   UIBC ug/dL 336  308 CM  354 CM  299 CM  338 CM   Comment: Performed at [facility], [address] [place], [place] [address]  Resulting Agency  [facility] CLIN LAB [place] [facility] [place] [facility] LAB  [place] [facility] [place] [facility]          Specimen Collected: 10/25/20 14:42 Last Resulted: 10/25/20 19:25     Lab Flowsheet    Order Details    View Encounter    Lab and Collection Details    Routing    Result History      CM=Additional comments     Result Care Coordination   Patient Communication  Add Comments Not seen Back to Top          Contains abnormal dataCBC with Differential Order: [id number]  Status: Final result    Visible to patient: No (inaccessible in MyChart)    Next appt: 01/09/2021 at 02:30 PM in Oncology ([facility])    Dx: Iron deficiency    0 Result Notes   Component Ref Range & Units 5 d ago  (10/25/20) 3 wk ago  (10/09/20) 1 mo ago  (09/27/20) 1 mo ago  (09/11/20) 2 mo ago  (08/29/20) 3 mo ago  (07/17/20) 4 mo ago  (06/05/20)  WBC 4.0 - 10.5 K/uL 7.0  7.0  8.2  8.1  7.4  7.3  8.6   RBC 4.22 - 5.81 MIL/uL 5.62  5.46  5.18  4.59  5.60  5.37  5.30   Hemoglobin 13.0 - 17.0 g/dL 14.3  13.9  13.2  11.7Low  14.2  12.7Low  12.1Low   HCT 39.0 - 52.0 % 45.3  44.9  41.9  38.0Low  45.0  40.5  39.3   MCV 80.0 - 100.0 fL 80.6  82.2  80.9  82.8  80.4  75.4Low  74.2Low  MCH 26.0 - 34.0 pg 25.4Low  25.5Low  25.5Low  25.5Low  25.4Low  23.6Low  22.8Low   MCHC 30.0 - 36.0 g/dL 31.6  31.0  31.5  30.8  31.6  31.4  30.8   RDW 11.5 - 15.5 % 17.9High  19.3High  18.4High  20.5High  21.4High  21.1High  17.9High   Platelets 150 - 400 K/uL 349  306  330  412High  275  272  306   nRBC 0.0 - 0.2 % 0.0  0.0  0.0  0.0 CM  0.0 CM  0.0  0.0 CM   Neutrophils Relative % % 64  65  67    63    Neutro Abs 1.7 - 7.7 K/uL 4.5  4.5  5.5    4.7    Lymphocytes Relative % 25  23  20    24     Lymphs Abs 0.7 - 4.0 K/uL 1.8  1.6  1.6    1.7    Monocytes Relative % 8  8  10    9     Monocytes Absolute 0.1 - 1.0 K/uL 0.6  0.6  0.8    0.6    Eosinophils Relative % 2  3  2    3     Eosinophils Absolute 0.0 - 0.5 K/uL 0.2  0.2   0.2    0.2    Basophils Relative % 1  1  1    1     Basophils Absolute 0.0 - 0.1 K/uL 0.1  0.1  0.1    0.0    Immature Granulocytes % 0  0  0    0    Abs Immature Granulocytes 0.00 - 0.07 K/uL 0.03  0.03 CM  0.03 CM    0.03 CM    Comment: Performed at [facility], [address], [place], [place] [address]  Resulting Agency  [facility] CLIN LAB [place] [facility] [place] [facility] [place] [facility] [place] [facility] [place] [facility] [place] [facility]          Specimen Collected: 10/25/20 14:42 Last Resulted: 10/25/20 14:51

## 2020-12-28 IMAGING — CR LEFT TIBIA AND FIBULA - 2 VIEW
4 series · 4 of 4 positions shown · non-contrast
Comparison: None.

CLINICAL DATA: Swelling

EXAM:
LEFT TIBIA AND FIBULA - 2 VIEW

[tibia ap (1 of 2)]
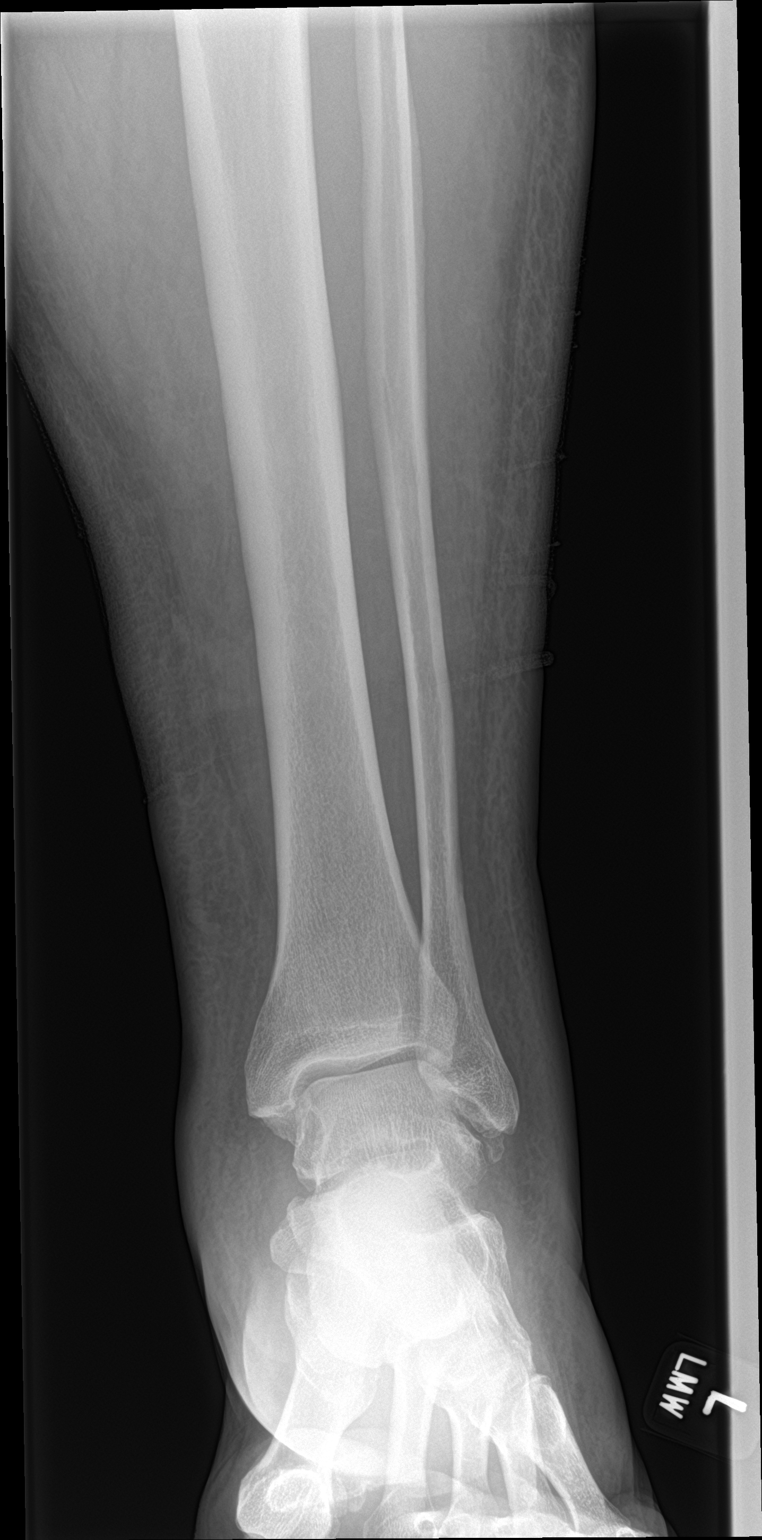

[tibia ap (2 of 2)]
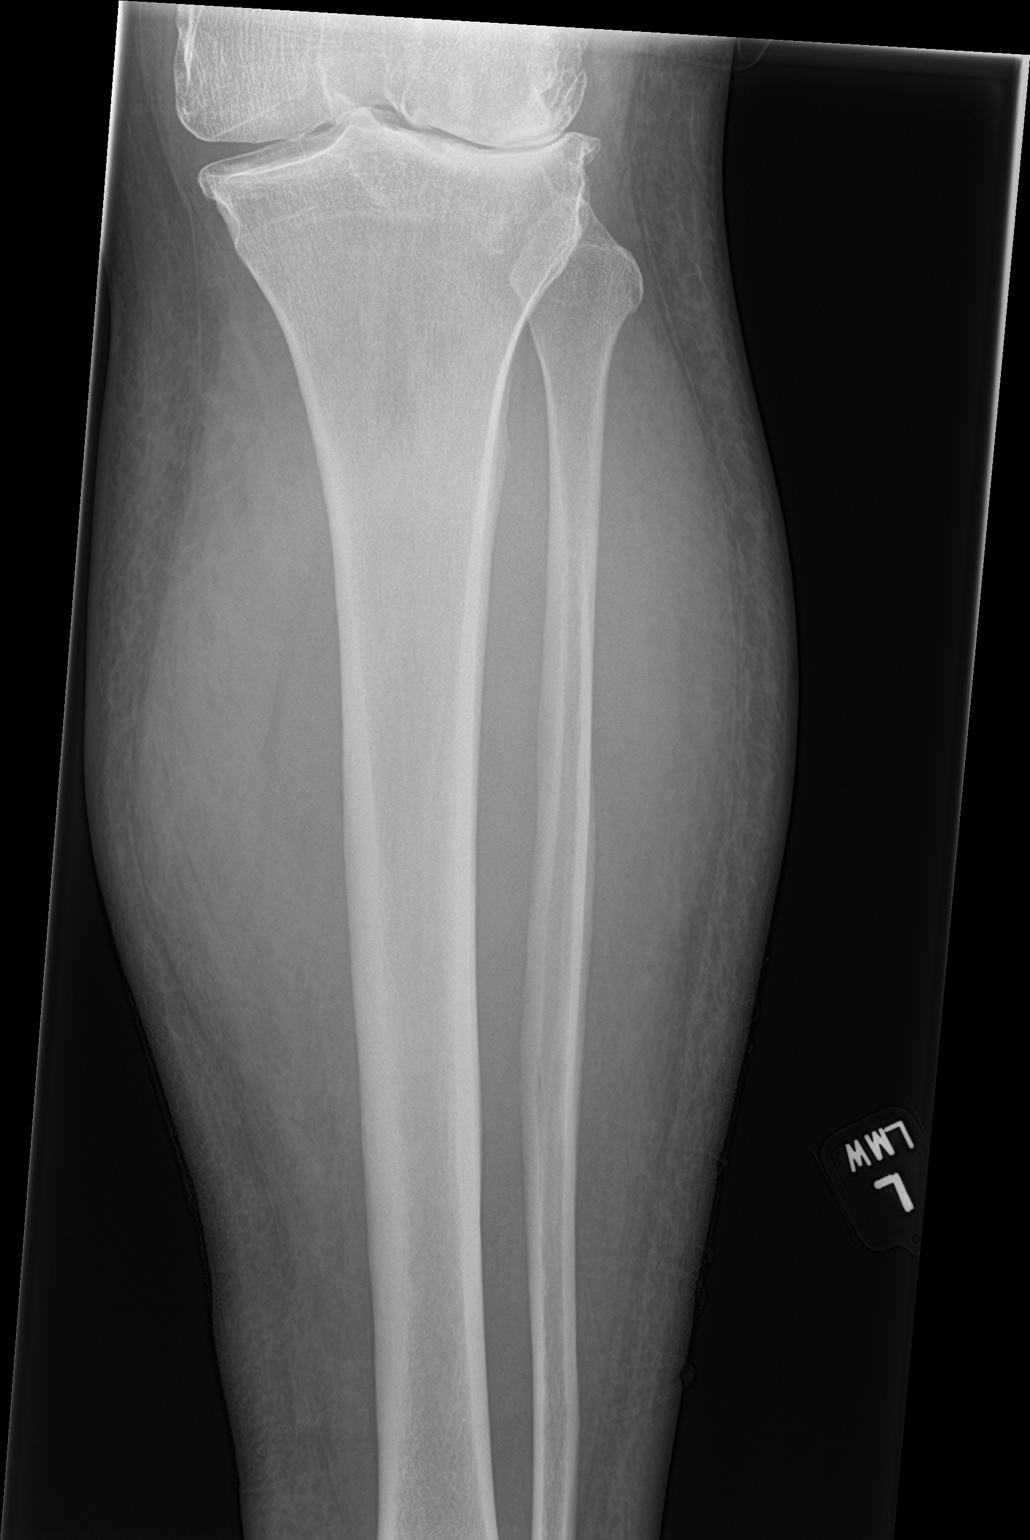

[tibia lat (1 of 2)]
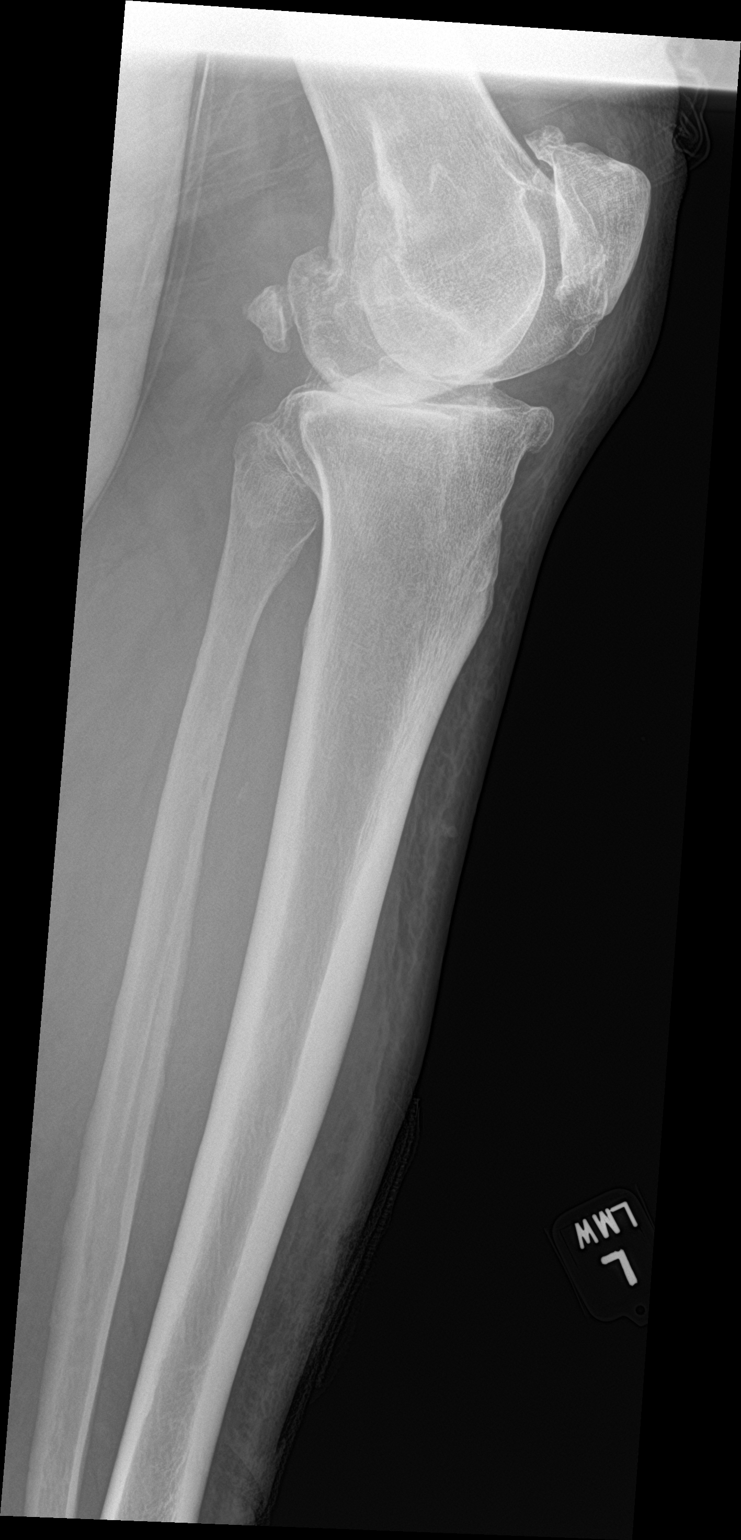

[tibia lat (2 of 2)]
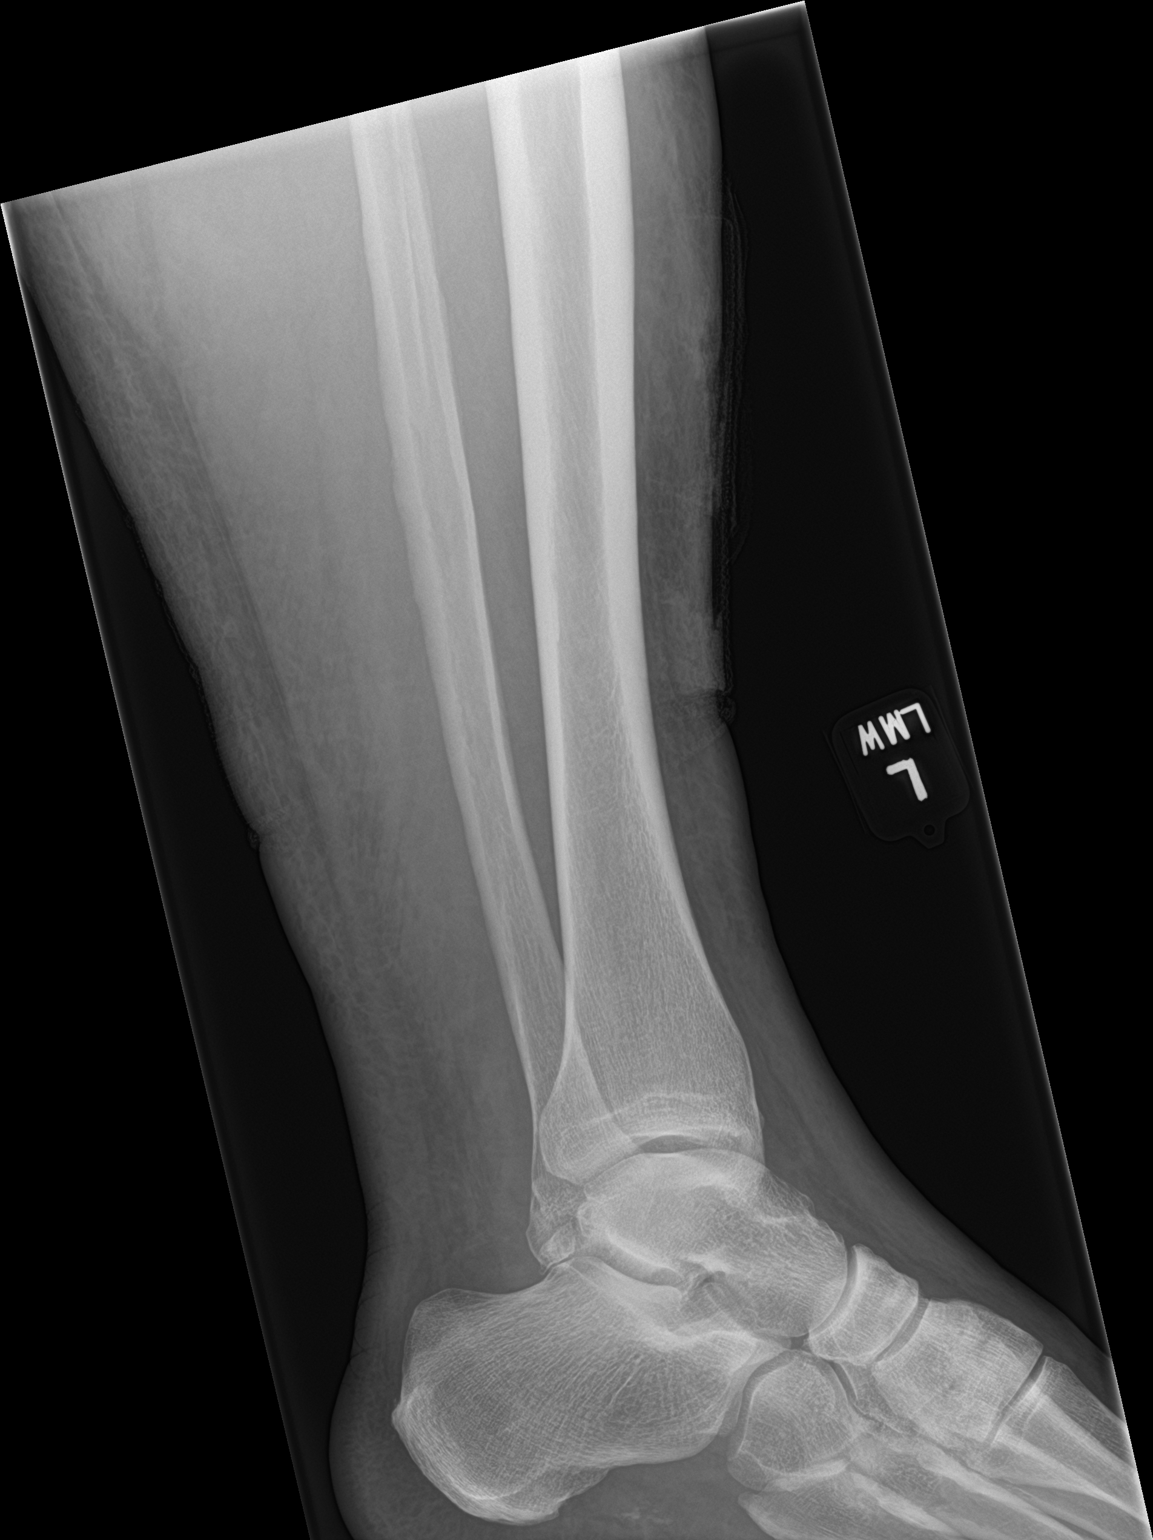

[4 of 4 positions shown; findings below may reference images not displayed]

FINDINGS: Diffuse soft tissue swelling. No fracture or malalignment. No
periostitis. No soft tissue emphysema. Arthritis at the knee.
Ossicle adjacent to the fibular malleolus
IMPRESSION: Diffuse soft tissue swelling without acute osseous abnormality

## 2021-01-09 ENCOUNTER — Inpatient Hospital Stay: Payer: Medicare Other | Attending: Hematology and Oncology

## 2021-01-09 ENCOUNTER — Other Ambulatory Visit: Payer: Self-pay

## 2021-01-09 DIAGNOSIS — D509 Iron deficiency anemia, unspecified: Secondary | ICD-10-CM | POA: Insufficient documentation

## 2021-01-09 DIAGNOSIS — E611 Iron deficiency: Secondary | ICD-10-CM

## 2021-01-09 LAB — CBC WITH DIFFERENTIAL/PLATELET
Abs Immature Granulocytes: 0.05 10*3/uL (ref 0.00–0.07)
Basophils Absolute: 0.1 10*3/uL (ref 0.0–0.1)
Basophils Relative: 1 %
Eosinophils Absolute: 0.2 10*3/uL (ref 0.0–0.5)
Eosinophils Relative: 2 %
HCT: 47 % (ref 39.0–52.0)
Hemoglobin: 15 g/dL (ref 13.0–17.0)
Immature Granulocytes: 1 %
Lymphocytes Relative: 22 %
Lymphs Abs: 1.7 10*3/uL (ref 0.7–4.0)
MCH: 25.1 pg — ABNORMAL LOW (ref 26.0–34.0)
MCHC: 31.9 g/dL (ref 30.0–36.0)
MCV: 78.7 fL — ABNORMAL LOW (ref 80.0–100.0)
Monocytes Absolute: 0.5 10*3/uL (ref 0.1–1.0)
Monocytes Relative: 6 %
Neutro Abs: 5.5 10*3/uL (ref 1.7–7.7)
Neutrophils Relative %: 68 %
Platelets: 283 10*3/uL (ref 150–400)
RBC: 5.97 MIL/uL — ABNORMAL HIGH (ref 4.22–5.81)
RDW: 19 % — ABNORMAL HIGH (ref 11.5–15.5)
WBC: 8 10*3/uL (ref 4.0–10.5)
nRBC: 0 % (ref 0.0–0.2)

## 2021-01-09 LAB — IRON AND TIBC
Iron: 51 ug/dL (ref 45–182)
Saturation Ratios: 13 % — ABNORMAL LOW (ref 17.9–39.5)
TIBC: 382 ug/dL (ref 250–450)
UIBC: 331 ug/dL

## 2021-01-09 LAB — C-REACTIVE PROTEIN: CRP: 1.1 mg/dL — ABNORMAL HIGH (ref <1.0)

## 2021-01-09 LAB — FERRITIN: Ferritin: 19 ng/mL — ABNORMAL LOW (ref 24–336)

## 2021-01-10 ENCOUNTER — Telehealth: Payer: Self-pay

## 2021-01-10 ENCOUNTER — Telehealth: Payer: Self-pay | Admitting: Hematology and Oncology

## 2021-01-10 NOTE — Telephone Encounter (Signed)
Per MD patient to come in for Venofer weekly x 2. Called patient and confirmed 1st infusion tomorrow at 1:30. Patient agreeable.

## 2021-01-10 NOTE — Telephone Encounter (Signed)
-----   Message from Lequita Asal, MD sent at 01/10/2021 12:59 PM EST ----- Regarding: Please call patient  Patient not anemic, but ferritin low (19) and size of RBCs now small.  Typically receives Venofer if ferritin < 30.  Consider Venofer weekly x 2.  M  ----- Message ----- From: Buel Ream, Lab In [place] Sent: 01/09/2021   2:45 PM EST To: Lequita Asal, MD

## 2021-01-10 NOTE — Telephone Encounter (Signed)
-----   Message from Lequita Asal, MD sent at 01/10/2021 12:59 PM EST ----- Regarding: Please call patient  Patient not anemic, but ferritin low (19) and size of RBCs now small.  Typically receives Venofer if ferritin < 30.  Consider Venofer weekly x 2.  M  ----- Message ----- From: Buel Ream, Lab In [place] Sent: 01/09/2021   2:45 PM EST To: Lequita Asal, MD

## 2021-01-10 NOTE — Telephone Encounter (Signed)
Left message for patient to call back. See below

## 2021-01-11 ENCOUNTER — Inpatient Hospital Stay: Payer: Medicare Other

## 2021-01-11 ENCOUNTER — Other Ambulatory Visit: Payer: Self-pay

## 2021-01-11 VITALS — BP 124/74 | HR 88 | Resp 18

## 2021-01-11 DIAGNOSIS — E611 Iron deficiency: Secondary | ICD-10-CM

## 2021-01-11 DIAGNOSIS — D509 Iron deficiency anemia, unspecified: Secondary | ICD-10-CM | POA: Diagnosis not present

## 2021-01-11 MED ORDER — IRON SUCROSE 20 MG/ML IV SOLN
200.0000 mg | Freq: Once | INTRAVENOUS | Status: AC
  Administered 2021-01-11: 200 mg via INTRAVENOUS
  Filled 2021-01-11: qty 10

## 2021-01-11 MED ORDER — SODIUM CHLORIDE 0.9 % IV SOLN
INTRAVENOUS | Status: DC
  Filled 2021-01-11: qty 250

## 2021-01-11 MED ORDER — SODIUM CHLORIDE 0.9 % IV SOLN: 200.0000 mg | Freq: Once | INTRAVENOUS | Status: DC

## 2021-01-11 NOTE — Progress Notes (Signed)
Pt received prescribed treatment in clinic, pt stable at d/c. 

## 2021-01-18 ENCOUNTER — Inpatient Hospital Stay: Payer: Medicare Other | Attending: Hematology and Oncology

## 2021-01-18 VITALS — BP 150/77 | HR 84 | Resp 18

## 2021-01-18 DIAGNOSIS — D509 Iron deficiency anemia, unspecified: Secondary | ICD-10-CM | POA: Diagnosis not present

## 2021-01-18 DIAGNOSIS — E611 Iron deficiency: Secondary | ICD-10-CM

## 2021-01-18 MED ORDER — IRON SUCROSE 20 MG/ML IV SOLN
200.0000 mg | Freq: Once | INTRAVENOUS | Status: AC
  Administered 2021-01-18: 200 mg via INTRAVENOUS
  Filled 2021-01-18: qty 10

## 2021-01-18 MED ORDER — SODIUM CHLORIDE 0.9 % IV SOLN
Freq: Once | INTRAVENOUS | Status: AC
  Filled 2021-01-18: qty 250

## 2021-01-18 MED ORDER — SODIUM CHLORIDE 0.9 % IV SOLN: 200.0000 mg | Freq: Once | INTRAVENOUS | Status: DC

## 2021-04-04 ENCOUNTER — Other Ambulatory Visit: Payer: Self-pay

## 2021-04-04 DIAGNOSIS — E611 Iron deficiency: Secondary | ICD-10-CM

## 2021-04-05 ENCOUNTER — Other Ambulatory Visit: Payer: Self-pay

## 2021-04-05 ENCOUNTER — Inpatient Hospital Stay: Payer: Medicare Other | Attending: Oncology

## 2021-04-05 DIAGNOSIS — Z96652 Presence of left artificial knee joint: Secondary | ICD-10-CM | POA: Insufficient documentation

## 2021-04-05 DIAGNOSIS — I10 Essential (primary) hypertension: Secondary | ICD-10-CM | POA: Insufficient documentation

## 2021-04-05 DIAGNOSIS — D509 Iron deficiency anemia, unspecified: Secondary | ICD-10-CM | POA: Diagnosis present

## 2021-04-05 DIAGNOSIS — Z79899 Other long term (current) drug therapy: Secondary | ICD-10-CM | POA: Insufficient documentation

## 2021-04-05 DIAGNOSIS — E611 Iron deficiency: Secondary | ICD-10-CM

## 2021-04-05 LAB — CBC WITH DIFFERENTIAL/PLATELET
Abs Immature Granulocytes: 0.02 10*3/uL (ref 0.00–0.07)
Basophils Absolute: 0 10*3/uL (ref 0.0–0.1)
Basophils Relative: 1 %
Eosinophils Absolute: 0.1 10*3/uL (ref 0.0–0.5)
Eosinophils Relative: 2 %
HCT: 48.7 % (ref 39.0–52.0)
Hemoglobin: 16.3 g/dL (ref 13.0–17.0)
Immature Granulocytes: 0 %
Lymphocytes Relative: 30 %
Lymphs Abs: 1.9 10*3/uL (ref 0.7–4.0)
MCH: 28.1 pg (ref 26.0–34.0)
MCHC: 33.5 g/dL (ref 30.0–36.0)
MCV: 84 fL (ref 80.0–100.0)
Monocytes Absolute: 0.5 10*3/uL (ref 0.1–1.0)
Monocytes Relative: 7 %
Neutro Abs: 3.8 10*3/uL (ref 1.7–7.7)
Neutrophils Relative %: 60 %
Platelets: 211 10*3/uL (ref 150–400)
RBC: 5.8 MIL/uL (ref 4.22–5.81)
RDW: 17.9 % — ABNORMAL HIGH (ref 11.5–15.5)
WBC: 6.3 10*3/uL (ref 4.0–10.5)
nRBC: 0 % (ref 0.0–0.2)

## 2021-04-05 LAB — IRON AND TIBC
Iron: 70 ug/dL (ref 45–182)
Saturation Ratios: 22 % (ref 17.9–39.5)
TIBC: 315 ug/dL (ref 250–450)
UIBC: 245 ug/dL

## 2021-04-05 LAB — FERRITIN: Ferritin: 42 ng/mL (ref 24–336)

## 2021-04-09 ENCOUNTER — Other Ambulatory Visit: Payer: BLUE CROSS/BLUE SHIELD

## 2021-04-10 ENCOUNTER — Inpatient Hospital Stay: Payer: Medicare Other

## 2021-04-10 ENCOUNTER — Other Ambulatory Visit: Payer: Self-pay

## 2021-04-10 ENCOUNTER — Inpatient Hospital Stay (HOSPITAL_BASED_OUTPATIENT_CLINIC_OR_DEPARTMENT_OTHER): Payer: Medicare Other | Admitting: Oncology

## 2021-04-10 VITALS — BP 120/69 | HR 67

## 2021-04-10 VITALS — BP 134/79 | HR 77 | Temp 97.2°F | Resp 18 | Wt 310.8 lb

## 2021-04-10 DIAGNOSIS — D539 Nutritional anemia, unspecified: Secondary | ICD-10-CM | POA: Diagnosis not present

## 2021-04-10 DIAGNOSIS — R5383 Other fatigue: Secondary | ICD-10-CM

## 2021-04-10 DIAGNOSIS — E611 Iron deficiency: Secondary | ICD-10-CM | POA: Diagnosis not present

## 2021-04-10 DIAGNOSIS — D509 Iron deficiency anemia, unspecified: Secondary | ICD-10-CM | POA: Diagnosis not present

## 2021-04-10 LAB — FOLATE: Folate: 20.2 ng/mL (ref >5.9)

## 2021-04-10 LAB — VITAMIN B12: Vitamin B-12: 224 pg/mL (ref 180–914)

## 2021-04-10 MED ORDER — SODIUM CHLORIDE 0.9 % IV SOLN: 200.0000 mg | Freq: Once | INTRAVENOUS | Status: DC

## 2021-04-10 MED ORDER — SODIUM CHLORIDE 0.9 % IV SOLN
Freq: Once | INTRAVENOUS | Status: AC
  Filled 2021-04-10: qty 250

## 2021-04-10 MED ORDER — IRON SUCROSE 20 MG/ML IV SOLN
200.0000 mg | Freq: Once | INTRAVENOUS | Status: AC
  Administered 2021-04-10: 200 mg via INTRAVENOUS
  Filled 2021-04-10: qty 10

## 2021-04-10 NOTE — Addendum Note (Signed)
Addended by: Drue Dun on: 04/10/2021 02:35 PM   Modules accepted: Orders

## 2021-04-10 NOTE — Progress Notes (Signed)
[facility]  [address], [address] [place], [place] [address] Phone: [phone]  Fax: [phone]   Clinic Day:  04/10/2021  Referring physician: Gladstone Lighter, MD  Chief Complaint: Benjamin [name] is a 70 y.o. male with iron deficiency anemia who seen for follow-up.  He was last seen in clinic on 10/10/2020.  He has received IV Venofer most recently on 01/11/2021 and 01/18/2021.  HPI: In the interim, he has done well.  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 Marry Guan last year.   He completed physical therapy.  He is followed by cardiology Dr. Clayborn Bigness for some swelling, pain and stiffness after physical therapy.  Previously has had a negative stress test and ABIs.  He recommended support stockings and elevation.  Reports improvement of his energy levels post iron infusions.  Endorses chronic fatigue since he has had both his knees replaced.  Feels he tires easily.  His diet is not the best.  He eats about 1 big meal per day.  He does not sleep well.  Takes trazodone which helps.   Past Medical History:  Diagnosis Date  . Anemia   . Arthritis   . Cancer ([place])    skin face and back basal cell  . Depression   . Dysrhythmia    a fib  . Edema of both lower legs   . GERD (gastroesophageal reflux disease)   . Hypertension   . Obesity   . OSA (obstructive sleep apnea)    uses C-pap machine   . Restless legs syndrome     Past Surgical History:  Procedure Laterality Date  . BACK SURGERY  01/2017 and 01/2018  . colonscopy      x 2  . KNEE ARTHROPLASTY Left 01/26/2020   Procedure: COMPUTER ASSISTED TOTAL KNEE ARTHROPLASTY;  Surgeon: Dereck Leep, MD;  Location: [facility] ORS;  Service: Orthopedics;  Laterality: Left;  . KNEE ARTHROPLASTY Right 09/01/2020   Procedure: COMPUTER ASSISTED TOTAL KNEE ARTHROPLASTY - RNFA;  Surgeon: Dereck Leep, MD;  Location: [facility] ORS;  Service: Orthopedics;  Laterality: Right;  . KNEE  SURGERY Right    arthroscopy  . none    . TONSILLECTOMY  1964    Family History  Problem Relation Age of Onset  . CAD Neg Hx   . Diabetes Mellitus II Neg Hx     Social History:  reports that he has never smoked. He uses smokeless tobacco. He reports current alcohol use. He reports that he does not use drugs. The patient used dip and a can lasted him about a week. He has cut down on this. He denies alcohol use. He denies exposure to radiation or toxins. 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 His wife's name is Pam.  The patient is alone today.  Allergies:  Allergies  Allergen Reactions  . Pramipexole Itching    Current Medications: Current Outpatient Medications  Medication Sig Dispense Refill  . acetaminophen (TYLENOL) 500 MG tablet Take 500-1,000 mg by mouth every 6 (six) hours as needed (for pain.).     Marland Kitchen buPROPion (WELLBUTRIN XL) 150 MG 24 hr tablet Take 300 mg by mouth daily.   4  . celecoxib (CELEBREX) 200 MG capsule Take by mouth.    . DULoxetine (CYMBALTA) 30 MG capsule Take by mouth.    . oxyCODONE (OXY IR/ROXICODONE) 5 MG immediate release tablet Take 1-2 tablets (5-10 mg total) by mouth every 4 (four) hours as needed  for moderate pain (pain score 4-6). 30 tablet 0  . oxymetazoline (AFRIN) 0.05 % nasal spray Place 1 spray into both nostrils 2 (two) times daily as needed for congestion.    . pantoprazole (PROTONIX) 40 MG tablet Take 40 mg by mouth daily.  5  . potassium chloride (KLOR-CON) 10 MEQ tablet Take 10 mEq by mouth daily as needed (with lasix (fluid retention)).    Marland Kitchen tadalafil (CIALIS) 5 MG tablet Take 5 mg by mouth daily as needed for erectile dysfunction.    Marland Kitchen testosterone cypionate (DEPOTESTOSTERONE CYPIONATE) 200 MG/ML injection Inject 200 mg into the muscle every 14 (fourteen) days.  2  . traMADol (ULTRAM) 50 MG tablet Take 1-2 tablets (50-100 mg total) by mouth every 4 (four) hours as needed for moderate pain. 30 tablet 0  . traZODone (DESYREL) 50 MG  tablet Take 50-100 mg by mouth at bedtime as needed for sleep.     . tamsulosin (FLOMAX) 0.4 MG CAPS capsule Take 0.4 mg by mouth daily. (Patient not taking: Reported on 04/10/2021)    . torsemide (DEMADEX) 10 MG tablet Take 40 mg by mouth 2 (two) times daily. Prn (Patient not taking: No sig reported)     No current facility-administered medications for this visit.    Review of Systems  Constitutional: Positive for malaise/fatigue. Negative for chills, fever and weight loss.  HENT: Negative for congestion, ear pain and tinnitus.   Eyes: Negative.  Negative for blurred vision and double vision.  Respiratory: Negative.  Negative for cough, sputum production and shortness of breath.   Cardiovascular: Negative.  Negative for chest pain, palpitations and leg swelling.  Gastrointestinal: Negative.  Negative for abdominal pain, constipation, diarrhea, nausea and vomiting.  Genitourinary: Negative for dysuria, frequency and urgency.  Musculoskeletal: Positive for joint pain. Negative for back pain and falls.  Skin: Negative.  Negative for rash.  Neurological: Positive for weakness. Negative for headaches.  Endo/Heme/Allergies: Negative.  Does not bruise/bleed easily.  Psychiatric/Behavioral: Negative for depression. The patient has insomnia. The patient is not nervous/anxious.    Performance status (ECOG): 1  Vitals Weight (!) 310 lb 13.6 oz (141 kg).   Physical Exam Constitutional:      Appearance: He is well-developed. He is obese.  HENT:     Head: Normocephalic and atraumatic.  Eyes:     Pupils: Pupils are equal, round, and reactive to light.  Cardiovascular:     Rate and Rhythm: Normal rate and regular rhythm.     Heart sounds: No murmur heard.   Pulmonary:     Effort: Pulmonary effort is normal.     Breath sounds: Normal breath sounds. No wheezing.  Abdominal:     General: Bowel sounds are normal. There is no distension.     Palpations: Abdomen is soft. There is no mass.      Tenderness: There is no abdominal tenderness.  Musculoskeletal:        General: Normal range of motion.     Cervical back: Normal range of motion.  Skin:    General: Skin is warm and dry.  Neurological:     Mental Status: He is alert and oriented to person, place, and time.  Psychiatric:        Behavior: Behavior normal.     09/12/2020: s/p right total knee replacement on 09/01/2020     No visits with results within 3 Day(s) from this visit.  Latest known visit with results is:  Appointment on 04/05/2021  Component Date Value  Ref Range Status  . Iron 04/05/2021 70  45 - 182 ug/dL Final  . TIBC 04/05/2021 315  250 - 450 ug/dL Final  . Saturation Ratios 04/05/2021 22  17.9 - 39.5 % Final  . UIBC 04/05/2021 245  ug/dL Final   Performed at [facility], [address], [place], [place] [address]  . Ferritin 04/05/2021 42  24 - 336 ng/mL Final   Performed at [facility], [address] [place], [place] [address]  . WBC 04/05/2021 6.3  4.0 - 10.5 K/uL Final  . RBC 04/05/2021 5.80  4.22 - 5.81 MIL/uL Final  . Hemoglobin 04/05/2021 16.3  13.0 - 17.0 g/dL Final  . HCT 04/05/2021 48.7  39.0 - 52.0 % Final  . MCV 04/05/2021 84.0  80.0 - 100.0 fL Final  . MCH 04/05/2021 28.1  26.0 - 34.0 pg Final  . MCHC 04/05/2021 33.5  30.0 - 36.0 g/dL Final  . RDW 04/05/2021 17.9* 11.5 - 15.5 % Final  . Platelets 04/05/2021 211  150 - 400 K/uL Final  . nRBC 04/05/2021 0.0  0.0 - 0.2 % Final  . Neutrophils Relative % 04/05/2021 60  % Final  . Neutro Abs 04/05/2021 3.8  1.7 - 7.7 K/uL Final  . Lymphocytes Relative 04/05/2021 30  % Final  . Lymphs Abs 04/05/2021 1.9  0.7 - 4.0 K/uL Final  . Monocytes Relative 04/05/2021 7  % Final  . Monocytes Absolute 04/05/2021 0.5  0.1 - 1.0 K/uL Final  . Eosinophils Relative 04/05/2021 2  % Final  . Eosinophils Absolute 04/05/2021 0.1  0.0 - 0.5 K/uL Final  . Basophils Relative 04/05/2021 1  % Final  . Basophils Absolute 04/05/2021 0.0   0.0 - 0.1 K/uL Final  . Immature Granulocytes 04/05/2021 0  % Final  . Abs Immature Granulocytes 04/05/2021 0.02  0.00 - 0.07 K/uL Final   Performed at [facility], [address] [place], [place] [address]    Assessment:  ORLA JOLLIFF is a 70 y.o. male with iron deficiency anemia.  He denies any melena, hematochezia, or hematuria.  Diet appear good.  He has taken oral iron and IV iron in the past.  CBC on 06/09/2020 revealed a hematocrit 40.8, hemoglobin 12.5, MCV 76.4, platelets 290,000, WBC 9,400.  B12 was 369 on 06/20/2020.  Labs on 07/17/2020 revealed a hematocrit of 40.5, hemoglobin 12.7, platelets 272,000, WBC 7,300.  Ferritin was 17 (low) with an iron saturation of 8% (low) and a TIBC of 368.  Retic count was 2.2%.  Folate was 13.2. TSH was 2.719. Urinalysis was normal.  The patient received Venofer x 3 (08/01/2020 - 08/14/2020), 09/12/2020 and 09/27/2020.  Ferritin has been followed: 44 on 02/06/2016, 18 on 10/06/2018, 16 on 09/15/2019, 27 on 01/03/2020, 55 on 02/09/2020, 15 on 06/09/2020, 17 on 07/17/2020, 104 on 09/11/2020, 76 on 09/27/2020, and 103 on 10/09/2020.  Iron saturation has typically been 7-8%.  Colonoscopy in [place] was negative 2-3 years ago. Upper endoscopy 25 ago revealed esophageal bleeding due to acid reflux; he has been on Protonix.  Symptomatically, he feels tired.  Continues to recover from his knee replacement in October.  Is not sleeping well.  Plan:  Iron deficiency -Labs from 04/05/2021 show a hemoglobin of 16.3, ferritin 42 and iron saturation 22%. -Colonoscopy 2 to 3 years ago in [place] was negative. - He had an upper endoscopy 25 years ago with apparent esophagitis. -He receives Venofer for ferritin < 30.  -Ferritin goal is  100. -He last received IV Venofer on 01/11/2021 and 01/18/2021. - Reports fatigue.  -Okay to proceed with 1 additional IV Venofer.  Diet/nutrition: -This is likely contributing to his  fatigue. -Recommend he take in more protein and eat more throughout the day. -Recommend he hydrate.  Disposition: 1 dose of IV Venofer today. RTC in 3 months for labs (CBC, ferritin, iron studies, CRP). RTC in 6 months for MD assessment, labs (CBC, ferritin, iron studies- day before) and +/- Venofer.  I discussed the assessment and treatment plan with the patient.  The patient was provided an opportunity to ask questions and all were answered.  The patient agreed with the plan and demonstrated an understanding of the instructions.  The patient was advised to call back if the symptoms worsen or if the condition fails to improve as anticipated.  Greater than 50% was spent in counseling and coordination of care with this patient including but not limited to discussion of the relevant topics above (See A&P) including, but not limited to diagnosis and management of acute and chronic medical conditions.   Faythe Casa, NP 04/10/2021 1:22 PM

## 2021-04-11 ENCOUNTER — Telehealth: Payer: Self-pay

## 2021-04-11 ENCOUNTER — Other Ambulatory Visit: Payer: Self-pay

## 2021-04-11 DIAGNOSIS — E611 Iron deficiency: Secondary | ICD-10-CM

## 2021-04-11 DIAGNOSIS — D539 Nutritional anemia, unspecified: Secondary | ICD-10-CM

## 2021-04-11 LAB — COPPER, SERUM: Copper: 86 ug/dL (ref 69–132)

## 2021-04-11 NOTE — Progress Notes (Signed)
His B12 is on the low end of normal. Can he start taking oral B12?  He can take 1000 mcg daily. We will recheck at his next visit. This may help with his fatigue.   Faythe Casa, NP 04/11/2021 10:41 AM

## 2021-04-11 NOTE — Telephone Encounter (Signed)
Left message for patient to call back  

## 2021-04-11 NOTE — Telephone Encounter (Signed)
-----   Message from Jacquelin Hawking, NP sent at 04/11/2021 10:41 AM EDT ----- His B12 is on the low end of normal. Can he start taking oral B12?  He can take 1000 mcg daily. We will recheck at his next visit. This may help with his fatigue.   Faythe Casa, NP 04/11/2021 10:41 AM

## 2021-04-11 NOTE — Telephone Encounter (Signed)
Patient aware, he will start taking. Lab orders added to next visit to recheck.

## 2021-07-10 ENCOUNTER — Inpatient Hospital Stay: Payer: Medicare Other | Attending: Oncology

## 2021-07-10 ENCOUNTER — Other Ambulatory Visit: Payer: Self-pay

## 2021-07-10 DIAGNOSIS — D508 Other iron deficiency anemias: Secondary | ICD-10-CM | POA: Insufficient documentation

## 2021-07-10 DIAGNOSIS — E611 Iron deficiency: Secondary | ICD-10-CM

## 2021-07-10 DIAGNOSIS — D539 Nutritional anemia, unspecified: Secondary | ICD-10-CM

## 2021-07-10 DIAGNOSIS — R5383 Other fatigue: Secondary | ICD-10-CM

## 2021-07-10 LAB — C-REACTIVE PROTEIN: CRP: 1.2 mg/dL — ABNORMAL HIGH (ref <1.0)

## 2021-07-10 LAB — CBC
HCT: 48.8 % (ref 39.0–52.0)
Hemoglobin: 17.1 g/dL — ABNORMAL HIGH (ref 13.0–17.0)
MCH: 30.5 pg (ref 26.0–34.0)
MCHC: 35 g/dL (ref 30.0–36.0)
MCV: 87 fL (ref 80.0–100.0)
Platelets: 225 10*3/uL (ref 150–400)
RBC: 5.61 MIL/uL (ref 4.22–5.81)
RDW: 14 % (ref 11.5–15.5)
WBC: 5.6 10*3/uL (ref 4.0–10.5)
nRBC: 0 % (ref 0.0–0.2)

## 2021-07-10 LAB — IRON AND TIBC
Iron: 92 ug/dL (ref 45–182)
Saturation Ratios: 27 % (ref 17.9–39.5)
TIBC: 339 ug/dL (ref 250–450)
UIBC: 247 ug/dL

## 2021-07-10 LAB — VITAMIN B12: Vitamin B-12: 307 pg/mL (ref 180–914)

## 2021-07-10 LAB — FERRITIN: Ferritin: 55 ng/mL (ref 24–336)

## 2021-08-20 ENCOUNTER — Telehealth: Payer: Self-pay | Admitting: Oncology

## 2021-08-20 NOTE — Telephone Encounter (Signed)
Spoke with patient to confirm appointment changes of day/time and moving to [place] worked with his schedule. Patient was agreeable to all changes. Mailing AVS.

## 2021-10-02 ENCOUNTER — Inpatient Hospital Stay: Payer: Medicare Other | Attending: Oncology

## 2021-10-02 DIAGNOSIS — D508 Other iron deficiency anemias: Secondary | ICD-10-CM | POA: Insufficient documentation

## 2021-10-02 DIAGNOSIS — D751 Secondary polycythemia: Secondary | ICD-10-CM | POA: Insufficient documentation

## 2021-10-02 DIAGNOSIS — G2581 Restless legs syndrome: Secondary | ICD-10-CM | POA: Insufficient documentation

## 2021-10-02 DIAGNOSIS — Z79899 Other long term (current) drug therapy: Secondary | ICD-10-CM | POA: Insufficient documentation

## 2021-10-03 ENCOUNTER — Ambulatory Visit: Payer: BLUE CROSS/BLUE SHIELD | Admitting: Oncology

## 2021-10-03 ENCOUNTER — Ambulatory Visit: Payer: BLUE CROSS/BLUE SHIELD

## 2021-10-08 ENCOUNTER — Inpatient Hospital Stay (HOSPITAL_BASED_OUTPATIENT_CLINIC_OR_DEPARTMENT_OTHER): Payer: Medicare Other | Admitting: Oncology

## 2021-10-08 ENCOUNTER — Inpatient Hospital Stay: Payer: Medicare Other

## 2021-10-08 ENCOUNTER — Other Ambulatory Visit: Payer: Self-pay

## 2021-10-08 ENCOUNTER — Encounter: Payer: Self-pay | Admitting: Oncology

## 2021-10-08 VITALS — BP 140/77 | HR 75 | Temp 98.7°F | Resp 20 | Wt 309.2 lb

## 2021-10-08 DIAGNOSIS — Z79899 Other long term (current) drug therapy: Secondary | ICD-10-CM | POA: Diagnosis not present

## 2021-10-08 DIAGNOSIS — G2581 Restless legs syndrome: Secondary | ICD-10-CM | POA: Diagnosis not present

## 2021-10-08 DIAGNOSIS — D508 Other iron deficiency anemias: Secondary | ICD-10-CM

## 2021-10-08 DIAGNOSIS — D751 Secondary polycythemia: Secondary | ICD-10-CM | POA: Diagnosis not present

## 2021-10-08 NOTE — Progress Notes (Signed)
Hematology/Oncology Consult note [facility]  Telephone:([phone] [phone]  Patient Care Team: Gladstone Lighter, MD as PCP - General (Internal Medicine) Lequita Asal, MD (Inactive) as Referring Physician (Hematology and Oncology)   Name of the patient: Benjamin [name]  [id number]  07-18-1951   Date of visit: 10/08/21  Diagnosis-iron deficiency anemia  Chief complaint/ Reason for visit-routine follow-up of iron deficiency anemia  Heme/Onc history: Patient is a 70 year old male with history of iron deficiency anemia for which she has received IV iron in the past.Colonoscopy in [place] was negative 2-3 years ago. Upper endoscopy 25 ago revealed esophageal bleeding due to acid reflux.  He was on Protonix in the past and then decided to come off it.  He also has a history of sleep apnea for which he is on CPAP.  Also has a history of restless leg syndrome  Interval history-patient reports doing well overall.  Reports mild fatigue but denies other complaints at this time  ECOG PS- 1 Pain scale- 0   Review of systems- Review of Systems  Constitutional:  Positive for malaise/fatigue. Negative for chills, fever and weight loss.  HENT:  Negative for congestion, ear discharge and nosebleeds.   Eyes:  Negative for blurred vision.  Respiratory:  Negative for cough, hemoptysis, sputum production, shortness of breath and wheezing.   Cardiovascular:  Negative for chest pain, palpitations, orthopnea and claudication.  Gastrointestinal:  Negative for abdominal pain, blood in stool, constipation, diarrhea, heartburn, melena, nausea and vomiting.  Genitourinary:  Negative for dysuria, flank pain, frequency, hematuria and urgency.  Musculoskeletal:  Negative for back pain, joint pain and myalgias.  Skin:  Negative for rash.  Neurological:  Negative for dizziness, tingling, focal weakness, seizures, weakness and headaches.  Endo/Heme/Allergies:   Does not bruise/bleed easily.  Psychiatric/Behavioral:  Negative for depression and suicidal ideas. The patient does not have insomnia.       Allergies  Allergen Reactions   Pramipexole Itching     Past Medical History:  Diagnosis Date   Anemia    Arthritis    Cancer ([place])    skin face and back basal cell   Depression    Dysrhythmia    a fib   Edema of both lower legs    GERD (gastroesophageal reflux disease)    Hypertension    Obesity    OSA (obstructive sleep apnea)    uses C-pap machine    Restless legs syndrome      Past Surgical History:  Procedure Laterality Date   BACK SURGERY  01/2017 and 01/2018   colonscopy      x 2   KNEE ARTHROPLASTY Left 01/26/2020   Procedure: COMPUTER ASSISTED TOTAL KNEE ARTHROPLASTY;  Surgeon: Dereck Leep, MD;  Location: [facility] ORS;  Service: Orthopedics;  Laterality: Left;   KNEE ARTHROPLASTY Right 09/01/2020   Procedure: COMPUTER ASSISTED TOTAL KNEE ARTHROPLASTY - RNFA;  Surgeon: Dereck Leep, MD;  Location: [facility] ORS;  Service: Orthopedics;  Laterality: Right;   KNEE SURGERY Right    arthroscopy   none     TONSILLECTOMY  1964    Social History   Socioeconomic History   Marital status: Married    Spouse name: Not on file   Number of children: Not on file   Years of education: Not on file   Highest education level: Not on file  Occupational History   Occupation: works part time  Tobacco Use   Smoking status: Never   Smokeless  tobacco: Current  Vaping Use   Vaping Use: Never used  Substance and Sexual Activity   Alcohol use: Yes    Comment: occassionally   Drug use: No   Sexual activity: Not Currently  Other Topics Concern   Not on file  Social History Narrative   Not on file   Social Determinants of Health   Financial Resource Strain: Not on file  Food Insecurity: Not on file  Transportation Needs: Not on file  Physical Activity: Not on file  Stress: Not on file  Social Connections: Not on file  Intimate  Partner Violence: Not on file    Family History  Problem Relation Age of Onset   CAD Neg Hx    Diabetes Mellitus II Neg Hx      Current Outpatient Medications:    acetaminophen (TYLENOL) 500 MG tablet, Take 500-1,000 mg by mouth every 6 (six) hours as needed (for pain.). , Disp: , Rfl:    buPROPion (WELLBUTRIN XL) 150 MG 24 hr tablet, Take 300 mg by mouth daily.  (Patient not taking: Reported on 10/08/2021), Disp: , Rfl: 4   celecoxib (CELEBREX) 200 MG capsule, Take by mouth. (Patient not taking: Reported on 10/08/2021), Disp: , Rfl:    DULoxetine (CYMBALTA) 30 MG capsule, Take by mouth. (Patient not taking: Reported on 10/08/2021), Disp: , Rfl:    metoprolol succinate (TOPROL-XL) 25 MG 24 hr tablet, Take 25 mg by mouth daily. (Patient not taking: Reported on 10/08/2021), Disp: , Rfl:    oxyCODONE (OXY IR/ROXICODONE) 5 MG immediate release tablet, Take 1-2 tablets (5-10 mg total) by mouth every 4 (four) hours as needed for moderate pain (pain score 4-6). (Patient not taking: Reported on 10/08/2021), Disp: 30 tablet, Rfl: 0   oxymetazoline (AFRIN) 0.05 % nasal spray, Place 1 spray into both nostrils 2 (two) times daily as needed for congestion. (Patient not taking: Reported on 10/08/2021), Disp: , Rfl:    pantoprazole (PROTONIX) 40 MG tablet, Take 40 mg by mouth daily., Disp: , Rfl: 5   potassium chloride (KLOR-CON) 10 MEQ tablet, Take 10 mEq by mouth daily as needed (with lasix (fluid retention)). (Patient not taking: Reported on 10/08/2021), Disp: , Rfl:    tadalafil (CIALIS) 5 MG tablet, Take 5 mg by mouth daily as needed for erectile dysfunction. (Patient not taking: Reported on 10/08/2021), Disp: , Rfl:    tamsulosin (FLOMAX) 0.4 MG CAPS capsule, Take 0.4 mg by mouth daily. (Patient not taking: Reported on 04/10/2021), Disp: , Rfl:    testosterone cypionate (DEPOTESTOSTERONE CYPIONATE) 200 MG/ML injection, Inject 200 mg into the muscle every 14 (fourteen) days. (Patient not taking: Reported  on 10/08/2021), Disp: , Rfl: 2   torsemide (DEMADEX) 10 MG tablet, Take 40 mg by mouth 2 (two) times daily. Prn (Patient not taking: Reported on 10/10/2020), Disp: , Rfl:    traMADol (ULTRAM) 50 MG tablet, Take 1-2 tablets (50-100 mg total) by mouth every 4 (four) hours as needed for moderate pain. (Patient not taking: Reported on 10/08/2021), Disp: 30 tablet, Rfl: 0   traZODone (DESYREL) 50 MG tablet, Take 50-100 mg by mouth at bedtime as needed for sleep.  (Patient not taking: Reported on 10/08/2021), Disp: , Rfl:   Physical exam:  Vitals:   10/08/21 1309  BP: 140/77  Pulse: 75  Resp: 20  Temp: 98.7 F (37.1 C)  SpO2: 95%  Weight: (!) 309 lb 3.2 oz (140.3 kg)   Physical Exam Constitutional:      General: He is not  in acute distress.    Appearance: He is obese.  Cardiovascular:     Rate and Rhythm: Normal rate and regular rhythm.     Heart sounds: Normal heart sounds.  Pulmonary:     Effort: Pulmonary effort is normal.     Breath sounds: Normal breath sounds.  Abdominal:     General: Bowel sounds are normal.     Palpations: Abdomen is soft.  Skin:    General: Skin is warm and dry.  Neurological:     Mental Status: He is alert and oriented to person, place, and time.     CMP Latest Ref Rng & Units 10/25/2020  Glucose 70 - 99 mg/dL 114(H)  BUN 8 - 23 mg/dL 11  Creatinine 0.61 - 1.24 mg/dL 1.15  Sodium 135 - 145 mmol/L 137  Potassium 3.5 - 5.1 mmol/L 4.2  Chloride 98 - 111 mmol/L 104  CO2 22 - 32 mmol/L 25  Calcium 8.9 - 10.3 mg/dL 8.8(L)  Total Protein 6.5 - 8.1 g/dL 6.9  Total Bilirubin 0.3 - 1.2 mg/dL 0.5  Alkaline Phos 38 - 126 U/L 62  AST 15 - 41 U/L 21  ALT 0 - 44 U/L 16   CBC Latest Ref Rng & Units 07/10/2021  WBC 4.0 - 10.5 K/uL 5.6  Hemoglobin 13.0 - 17.0 g/dL 17.1(H)  Hematocrit 39.0 - 52.0 % 48.8  Platelets 150 - 400 K/uL 225    Assessment and plan- Patient is a 70 y.o. male history of iron deficiency anemia here for routine follow-up  Patient last  received 1 dose of Venofer back in May 2022 when hisHemoglobin was 16.3.  Most recently his hemoglobin in August was 17.1 with a ferritin of 55 and iron saturation of 27%.  He was supposed to get his labs done at this time but he did not get it today.  Given that his hemoglobin was high normal in August 2022 I do not think that he requires any IV iron at this time.  I suspect his mild degree of polycythemia is secondary to his sleep apnea which can also be a driver for fatigue.  Repeat CBC ferritin and iron studies in 4 and 8 months and I will see him in 8 months.  We will also check B12 levels in 4 months   Visit Diagnosis 1. Other iron deficiency anemia      Dr. Randa Evens, MD, MPH [facility] [phone] 10/08/2021 5:00 PM

## 2022-01-29 ENCOUNTER — Other Ambulatory Visit: Payer: Self-pay | Admitting: *Deleted

## 2022-01-29 DIAGNOSIS — D508 Other iron deficiency anemias: Secondary | ICD-10-CM

## 2022-02-04 ENCOUNTER — Telehealth: Payer: Self-pay | Admitting: *Deleted

## 2022-02-04 NOTE — Telephone Encounter (Signed)
Patient called to cancel appointment.

## 2022-02-05 ENCOUNTER — Encounter: Payer: Self-pay | Admitting: Hematology and Oncology

## 2022-02-05 ENCOUNTER — Inpatient Hospital Stay: Payer: BLUE CROSS/BLUE SHIELD

## 2022-04-08 ENCOUNTER — Other Ambulatory Visit: Payer: Self-pay | Admitting: Neurosurgery

## 2022-04-08 DIAGNOSIS — M4802 Spinal stenosis, cervical region: Secondary | ICD-10-CM

## 2022-04-19 ENCOUNTER — Ambulatory Visit
Admission: RE | Admit: 2022-04-19 | Discharge: 2022-04-19 | Disposition: A | Discharge status: Home / Self Care | Payer: Medicare Other | Source: Ambulatory Visit | Attending: Neurosurgery | Admitting: Neurosurgery

## 2022-04-19 DIAGNOSIS — M4802 Spinal stenosis, cervical region: Secondary | ICD-10-CM | POA: Diagnosis not present

## 2022-05-23 ENCOUNTER — Encounter: Payer: Self-pay | Admitting: Neurosurgery

## 2022-05-23 ENCOUNTER — Ambulatory Visit: Payer: Medicare Other | Admitting: Neurosurgery

## 2022-05-23 VITALS — BP 128/78 | Ht 67.99 in | Wt 305.6 lb

## 2022-05-23 DIAGNOSIS — M5412 Radiculopathy, cervical region: Secondary | ICD-10-CM

## 2022-05-23 NOTE — Progress Notes (Signed)
History of Present Illness: 05/23/2022 Since our last visit, [name] has undergone a L C6-7 TFESI on 04/22/22. He has been doing physical therapy as well.  He did not get much relief from his injection.  Physical therapy has been helping him significantly.  His pain is now manageable.  He only has trouble rotating his neck to the left.  04/05/2022 [name] is here today with a chief complaint of left trapezius and left shoulder blade pain. He also reports numbness in his left forearm and intermittent numbness in both hands.  He has been having numbness for the past 2 weeks in his forearm. He has had neck issues for several years. I previously saw him for this in 2020. He reports pain into his left shoulder blade and extending into his forearm and then numbness in the ulnar side of his forearm and hands.  Nothing really makes it worse. Meloxicam makes it better.  Bowel/Bladder Dysfunction: none  Conservative measures:  Physical therapy: has not participated recently  Multimodal medical therapy including regular antiinflammatories: meloxicam, celebrex  Injections: has received epidural steroid injections in the past  Past Surgery: Lumbar spine surgery x 2 by Benjamin [name] (2018 and 2019)  Benjamin [name] has no symptoms of cervical myelopathy.  The symptoms are causing a significant impact on the patient's life.   Review of Systems:  A 10 point review of systems is negative, except for the pertinent positives and negatives detailed in the HPI.   Current Meds  Medication Sig   acetaminophen (TYLENOL) 500 MG tablet Take 500-1,000 mg by mouth every 6 (six) hours as needed (for pain.).    buPROPion (WELLBUTRIN XL) 150 MG 24 hr tablet Take 2 tablets (300 mg total) by mouth daily.   meloxicam (MOBIC) 15 MG tablet Take 15 mg by mouth daily.   oxyCODONE (OXY IR/ROXICODONE) 5 MG immediate release tablet Take 1-2 tablets (5-10 mg total) by mouth every 4 (four) hours as  needed for moderate pain (pain score 4-6).   pantoprazole (PROTONIX) 40 MG tablet Take 40 mg by mouth daily.   traZODone (DESYREL) 50 MG tablet Take 50-100 mg by mouth at bedtime as needed for sleep.   [DISCONTINUED] buPROPion (WELLBUTRIN XL) 150 MG 24 hr tablet Take 300 mg by mouth daily.    Allergies  Allergen Reactions   Pramipexole Itching   Family History  Problem Relation Age of Onset   CAD Neg Hx    Diabetes Mellitus II Neg Hx    Social History   Tobacco Use   Smoking status: Never   Smokeless tobacco: Current  Vaping Use   Vaping Use: Never used  Substance Use Topics   Alcohol use: Yes    Comment: occassionally   Drug use: No     Physical Examination:  Today's Vitals   05/23/22 1352  BP: 128/78  Weight: (!) 305 lb 9.6 oz (138.6 kg)  Height: 5' 7.99" (1.727 m)  PainSc: 1   PainLoc: Neck   Body mass index is 46.48 kg/m.   General: Patient is well developed, well nourished, calm, collected, and in no apparent distress. Attention to examination is appropriate.  Psychiatric: Patient is non-anxious.  Head: Pupils equal, round, and reactive to light.  ENT: Oral mucosa appears well hydrated.  Neck: Supple. Full range of motion.  Respiratory: Patient is breathing without any difficulty.  Extremities: No edema.  Vascular: Palpable dorsal pedal pulses.  Skin: On exposed skin, there are no abnormal skin lesions.  NEUROLOGICAL:   Awake, alert, oriented to person, place, and time. Speech is clear and fluent. Fund of knowledge is appropriate.   Cranial Nerves: Pupils equal round and reactive to light. Facial tone is symmetric. Facial sensation is symmetric. Shoulder shrug is symmetric. Tongue protrusion is midline. There is no pronator drift.  Strength: Side Biceps Triceps Deltoid Interossei Grip Wrist Ext. Wrist Flex.  R '5 5 5 5 5 5 5  '$ L '5 5 5 5 5 5 5   '$ Side Iliopsoas Quads Hamstring PF DF EHL  R '5 5 5 5 5 5  '$ L '5 5 5 5 5 5   '$ Reflexes are 1+ and  symmetric at the biceps, triceps, brachioradialis, patella and achilles. Hoffman's is absent.  Clonus is not present. Toes are down-going.  Specimen some altered sensation in the ulnar side of his left arm. No evidence of dysmetria noted.  Gait is normal.   Medical Decision Making  Imaging: MRI C spine 04/19/22 IMPRESSION: 1. Multilevel cervical spondylosis with resultant moderate diffuse spinal stenosis at C3-4 through C6-7. 2. Multifactorial degenerative changes with resultant multilevel foraminal narrowing as above. Notable findings include severe left-sided foraminal stenosis at C4 through C7, with moderate foraminal narrowing on the right at C5 and C7, and on the left at C8. 3. Advanced facet arthropathy with reactive marrow edema about the left greater than right T1-2 facets. Finding could contribute to underlying neck pain. Resultant severe bilateral foraminal stenosis at this level.     Electronically Signed   By: Jeannine Boga M.D.   On: 04/21/2022 05:59    I have personally reviewed the images and agree with the above interpretation.  Assessment and Plan: [name] is a pleasant 71 y.o. male with lower cervical radiculopathies on the left side but has improved.  He is currently doing reasonably well with physical therapy.  He will continue to do physical therapy for now.  If his pain worsens, I am happy to see him and discuss possible options.  He has left-sided foraminal stenosis from C4 down to T2.  We would have to discuss his symptoms very clearly to determine the scope of any surgical intervention that was considered, whether it was limited to C4-7 anterior cervical discectomy and fusion or a posterior approach.     I spent a total of 15 minutes in both face-to-face and non-face-to-face activities for this visit on the date of this encounter.  Thank you for involving me in the care of this patient.   This note was partially dictated using voice  recognition software, so please excuse any errors that were not corrected.  Benjamin Maw MD, [facility] Department of Neurosurgery

## 2022-05-24 IMAGING — DX DG KNEE 1-2V PORT*R*
2 series · 2 of 2 positions shown · non-contrast
Comparison: None.

CLINICAL DATA: Status post right knee replacement.

EXAM:
PORTABLE RIGHT KNEE - 1-2 VIEW

[knee ap]
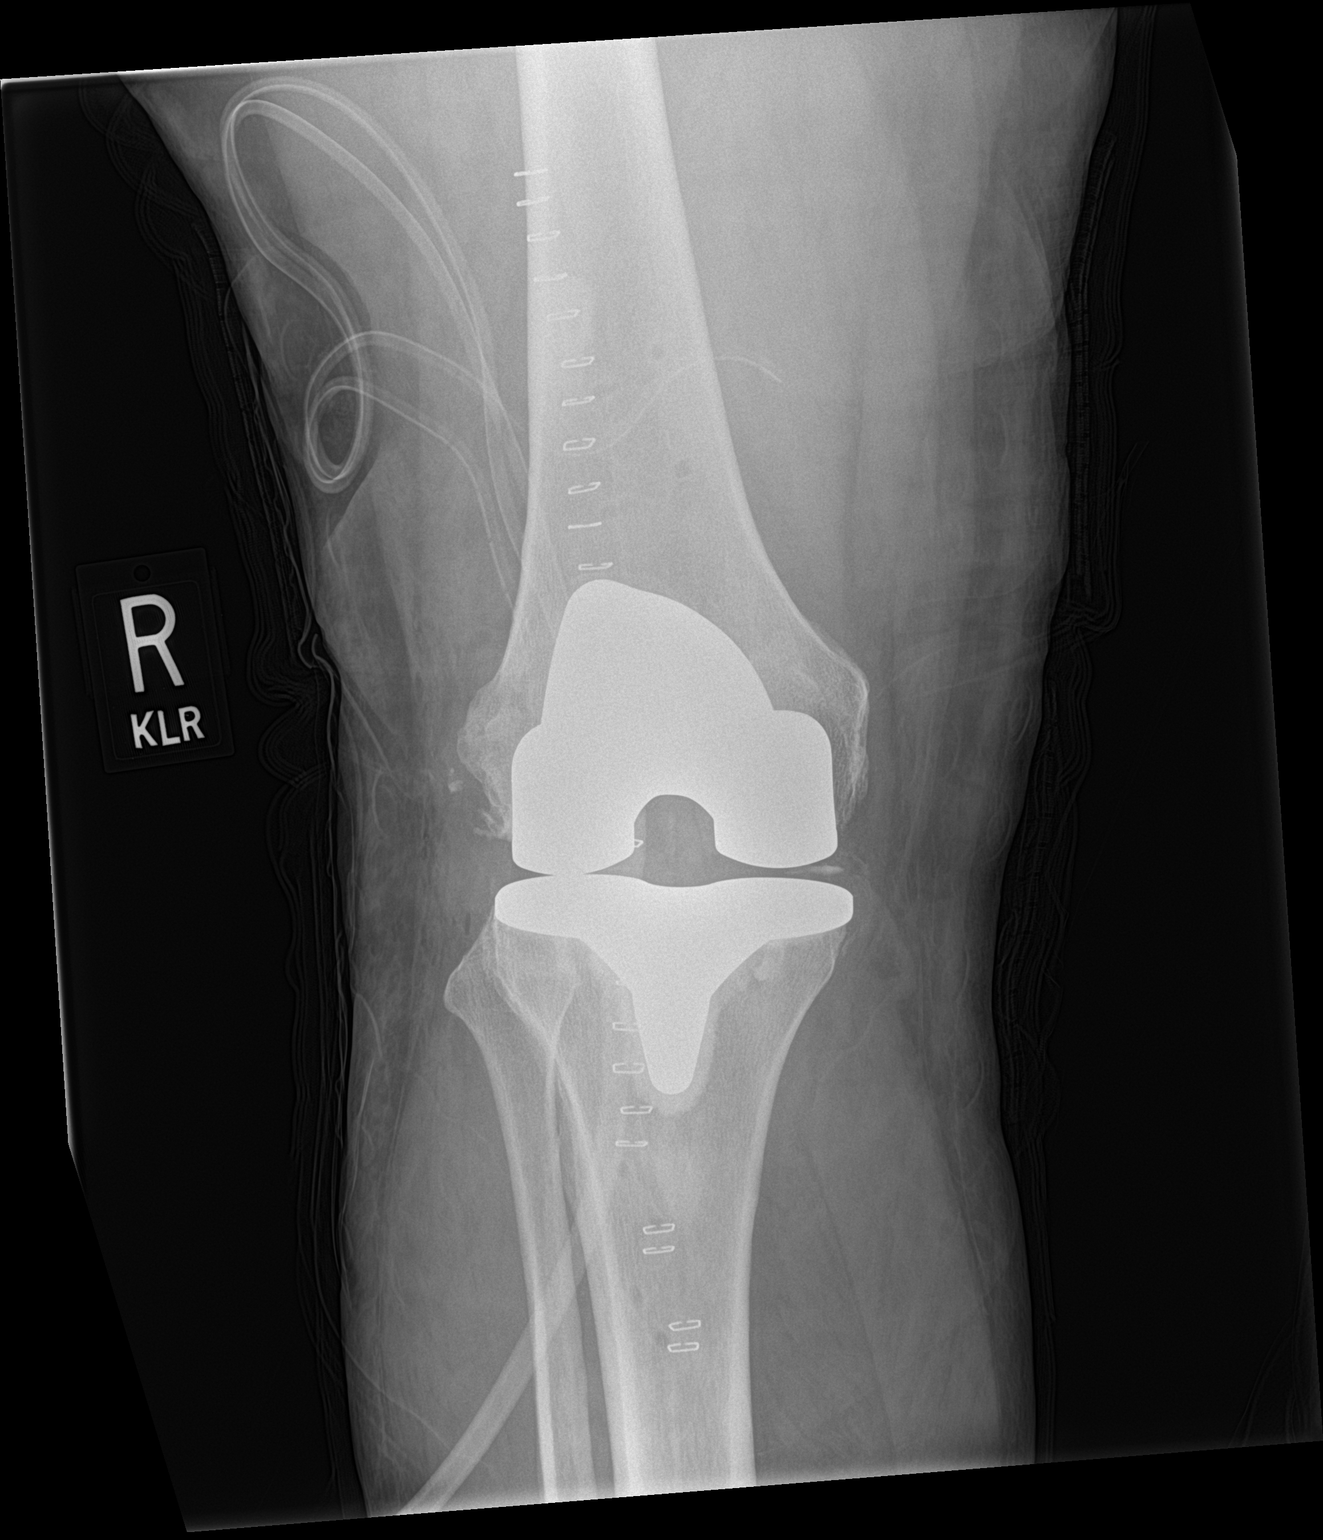

[knee lat]
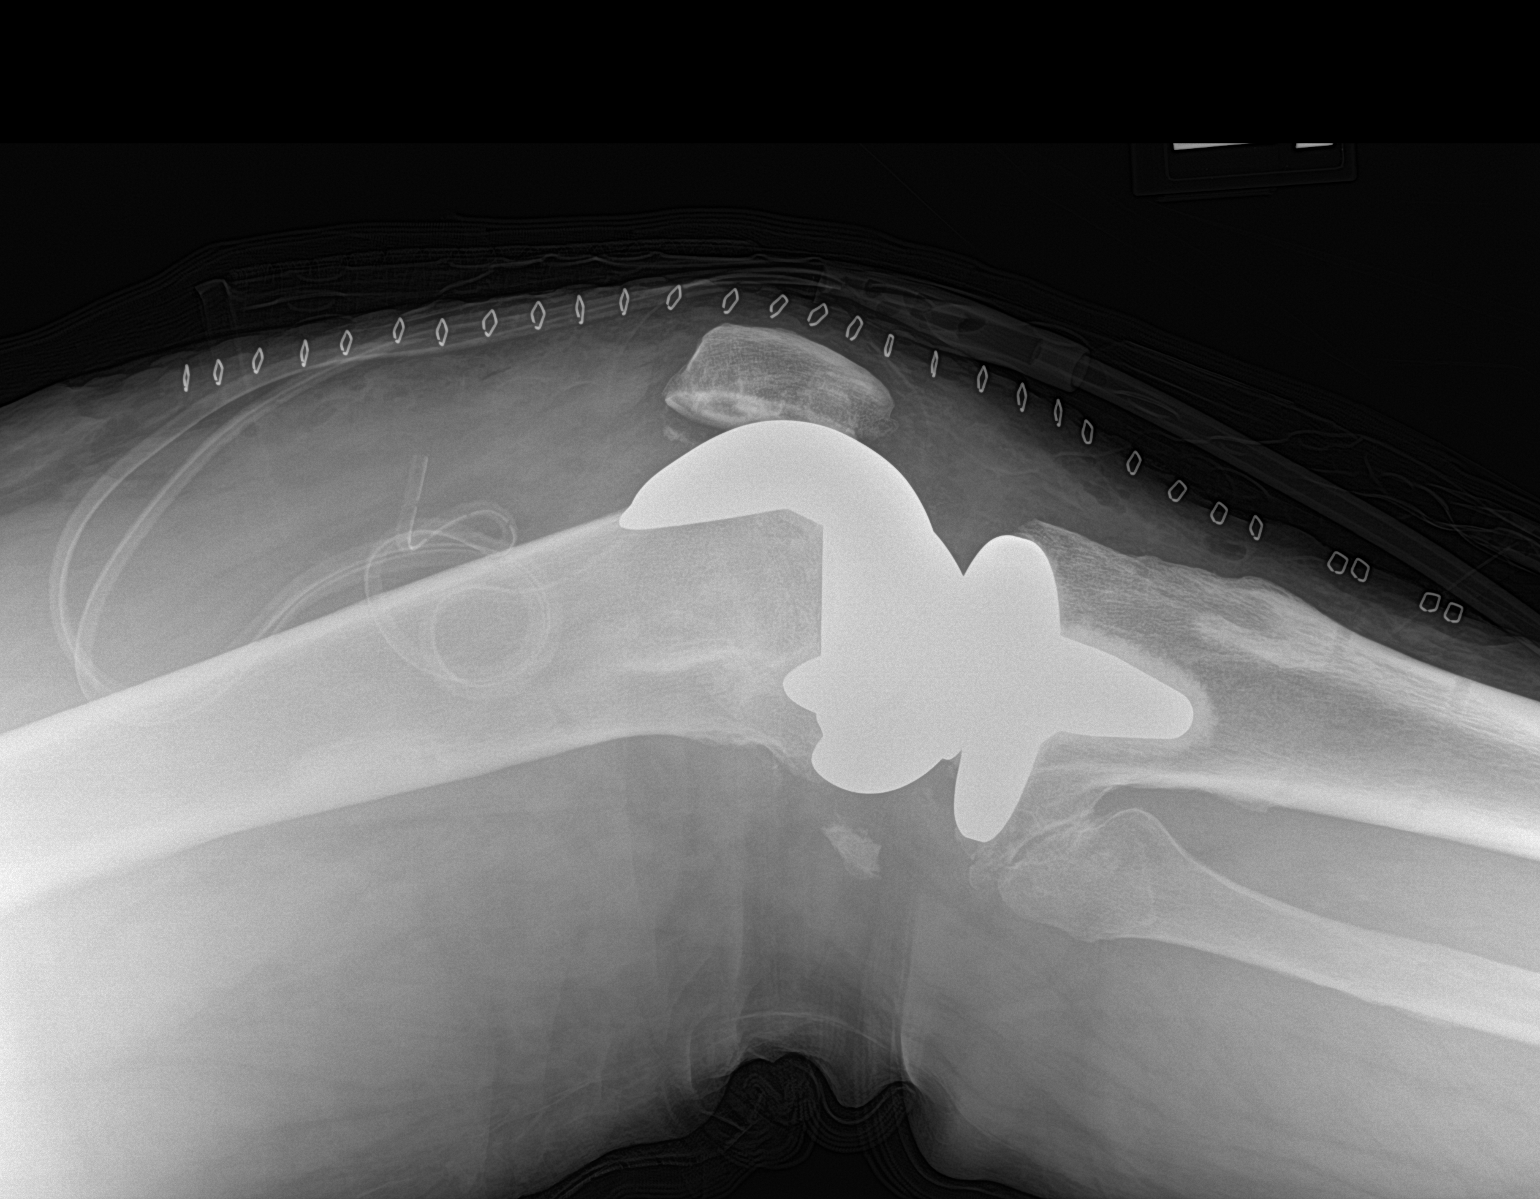

[2 of 2 positions shown; findings below may reference images not displayed]

FINDINGS: The right femoral and tibial components are well situated. Surgical
drain is seen in the suprapatellar region. Other expected
postsurgical changes are noted anteriorly.
IMPRESSION: Status post right total knee arthroplasty.

## 2022-06-07 ENCOUNTER — Inpatient Hospital Stay: Payer: Medicare Other

## 2022-06-07 ENCOUNTER — Inpatient Hospital Stay: Payer: Medicare Other | Admitting: Oncology

## 2023-09-19 ENCOUNTER — Emergency Department: Payer: Medicare Other

## 2023-09-19 ENCOUNTER — Encounter: Payer: Self-pay | Admitting: Hematology and Oncology

## 2023-09-19 ENCOUNTER — Other Ambulatory Visit: Payer: Self-pay

## 2023-09-19 ENCOUNTER — Encounter: Payer: Self-pay | Admitting: Emergency Medicine

## 2023-09-19 ENCOUNTER — Emergency Department
Admission: EM | Admit: 2023-09-19 | Discharge: 2023-09-19 | Disposition: A | Discharge status: Home / Self Care | Payer: Medicare Other | Attending: Emergency Medicine | Admitting: Emergency Medicine

## 2023-09-19 DIAGNOSIS — I480 Paroxysmal atrial fibrillation: Secondary | ICD-10-CM | POA: Insufficient documentation

## 2023-09-19 DIAGNOSIS — R002 Palpitations: Secondary | ICD-10-CM | POA: Diagnosis present

## 2023-09-19 HISTORY — DX: Unspecified atrial fibrillation: I48.91

## 2023-09-19 LAB — CBC WITH DIFFERENTIAL/PLATELET
Abs Immature Granulocytes: 0.03 10*3/uL (ref 0.00–0.07)
Basophils Absolute: 0.1 10*3/uL (ref 0.0–0.1)
Basophils Relative: 1 %
Eosinophils Absolute: 0.2 10*3/uL (ref 0.0–0.5)
Eosinophils Relative: 2 %
HCT: 48.4 % (ref 39.0–52.0)
Hemoglobin: 17.6 g/dL — ABNORMAL HIGH (ref 13.0–17.0)
Immature Granulocytes: 1 %
Lymphocytes Relative: 37 %
Lymphs Abs: 2.5 10*3/uL (ref 0.7–4.0)
MCH: 32.9 pg (ref 26.0–34.0)
MCHC: 36.4 g/dL — ABNORMAL HIGH (ref 30.0–36.0)
MCV: 90.5 fL (ref 80.0–100.0)
Monocytes Absolute: 0.7 10*3/uL (ref 0.1–1.0)
Monocytes Relative: 11 %
Neutro Abs: 3.2 10*3/uL (ref 1.7–7.7)
Neutrophils Relative %: 48 %
Platelets: 253 10*3/uL (ref 150–400)
RBC: 5.35 MIL/uL (ref 4.22–5.81)
RDW: 12.3 % (ref 11.5–15.5)
WBC: 6.6 10*3/uL (ref 4.0–10.5)
nRBC: 0 % (ref 0.0–0.2)

## 2023-09-19 LAB — COMPREHENSIVE METABOLIC PANEL
ALT: 20 U/L (ref 0–44)
AST: 25 U/L (ref 15–41)
Albumin: 4 g/dL (ref 3.5–5.0)
Alkaline Phosphatase: 74 U/L (ref 38–126)
Anion gap: 9 (ref 5–15)
BUN: 15 mg/dL (ref 8–23)
CO2: 23 mmol/L (ref 22–32)
Calcium: 8.9 mg/dL (ref 8.9–10.3)
Chloride: 106 mmol/L (ref 98–111)
Creatinine, Ser: 1.22 mg/dL (ref 0.61–1.24)
GFR, Estimated: >60 mL/min (ref >60)
Glucose, Bld: 121 mg/dL — ABNORMAL HIGH (ref 70–99)
Potassium: 4.2 mmol/L (ref 3.5–5.1)
Sodium: 138 mmol/L (ref 135–145)
Total Bilirubin: 0.9 mg/dL (ref 0.3–1.2)
Total Protein: 6.6 g/dL (ref 6.5–8.1)

## 2023-09-19 LAB — TROPONIN I (HIGH SENSITIVITY): Troponin I (High Sensitivity): 14 ng/L (ref <18)

## 2023-09-19 MED ORDER — DILTIAZEM HCL 30 MG PO TABS: 30.0000 mg | ORAL_TABLET | Freq: Three times a day (TID) | ORAL | 0 refills | Status: AC | PRN

## 2023-09-19 NOTE — ED Triage Notes (Signed)
Pt presents ambulatory to triage via POV with complaints of palpations that started last night. Pt has a hx of afib -not currently on medication. A&Ox4 at this time. Denies CP or SOB.

## 2023-09-19 NOTE — ED Notes (Signed)
This RN went to check on pt and check on pt and let him know we were still waiting for a room. Pt states "I should of stayed home and I wont be coming back here". This RN apologized for wait time and offered a warm blanket, pt refused at this time. Wife with pt at this time.

## 2023-09-19 NOTE — ED Provider Notes (Signed)
[facility] Provider Note    Event Date/Time   First MD Initiated Contact with Patient 09/19/23 [phone]     (approximate)   History   Chief Complaint: Palpitations   HPI  Benjamin [name] is a 72 y.o. male with a history of paroxysmal atrial fibrillation who comes ED complaining of palpitations and racing heart rate starting last night at about 2:00 AM when he was getting ready for bed.  Patient has had 2 prior episodes of atrial fibrillation in the past which seem to be triggered by activity or tea.   Patient was in his usual state of health, and last night he had a cup of tea.  Then when he was getting ready for bed, he ate part of a chocolate bar.  Subsequently he developed palpitations.  Tried to go to sleep but remained uncomfortable throughout the night.  Denies chest pain or shortness of breath.  On arrival to the ED treatment room, patient is feeling better.  Not on blood thinners.  Reports he was recommended to take daily aspirin but does not.     Physical Exam   Triage Vital Signs: ED Triage Vitals  Encounter Vitals Group     BP 09/19/23 0708 119/79     Systolic BP Percentile --      Diastolic BP Percentile --      Pulse Rate 09/19/23 0708 (!) 132     Resp 09/19/23 0708 20     Temp 09/19/23 0708 97.7 F (36.5 C)     Temp Source 09/19/23 0708 Oral     SpO2 09/19/23 0708 100 %     Weight 09/19/23 0700 300 lb (136.1 kg)     Height 09/19/23 0700 5\' 8"  (1.727 m)     Head Circumference --      Peak Flow --      Pain Score 09/19/23 0700 0     Pain Loc --      Pain Education --      Exclude from Growth Chart --     Most recent vital signs: Vitals:   09/19/23 0708  BP: 119/79  Pulse: (!) 132  Resp: 20  Temp: 97.7 F (36.5 C)  SpO2: 100%    General: Awake, no distress.  CV:  Good peripheral perfusion.  Regular rate and rhythm.  No murmurs Resp:  Normal effort.  Clear to auscultation bilaterally Abd:  No distention.  Soft  nontender Other:  No rash, no lower extremity edema   ED Results / Procedures / Treatments   Labs (all labs ordered are listed, but only abnormal results are displayed) Labs Reviewed  COMPREHENSIVE METABOLIC PANEL - Abnormal; Notable for the following components:      Result Value   Glucose, Bld 121 (*)    All other components within normal limits  CBC WITH DIFFERENTIAL/PLATELET - Abnormal; Notable for the following components:   Hemoglobin 17.6 (*)    MCHC 36.4 (*)    All other components within normal limits  TROPONIN I (HIGH SENSITIVITY)  TROPONIN I (HIGH SENSITIVITY)     EKG Interpreted by me Atrial fibrillation, rate of 136.  Normal axis intervals QRS ST segments and T waves.  Repeat EKG at 9:56 AM shows normal sinus rhythm, rate of 68.  No ischemic changes.   RADIOLOGY    PROCEDURES:  Procedures   MEDICATIONS ORDERED IN ED: Medications - No data to display   IMPRESSION / MDM / ASSESSMENT AND PLAN / ED  COURSE  I reviewed the triage vital signs and the nursing notes.  DDx: Electrolyte abnormality, AKI, paroxysmal atrial fibrillation, non-STEMI, anemia  Patient's presentation is most consistent with acute presentation with potential threat to life or bodily function.  Patient presents with palpitations, initial EKG consistent with atrial fibrillation, most likely due to methylxanthine ingestion.  On my assessment, patient heart rate is now about 70, repeat EKG confirms that he is in a normal sinus rhythm.  No ischemic symptoms, labs including troponin are normal, for symptoms ongoing approximately 10 hours.  Stable for discharge.  Will prescribe low-dose diltiazem for him to take as needed if rapid heart rate recurs.       FINAL CLINICAL IMPRESSION(S) / ED DIAGNOSES   Final diagnoses:  Paroxysmal atrial fibrillation (HCC)     Rx / DC Orders   ED Discharge Orders          Ordered    diltiazem (CARDIZEM) 30 MG tablet  3 times daily PRN         09/19/23 0951             Note:  This document was prepared using Dragon voice recognition software and may include unintentional dictation errors.   Sharman Cheek, MD 09/19/23 [phone]

## 2023-09-19 NOTE — ED Notes (Signed)
Pt moved to sub-waiting area due to IV being placed. Pt given call bell, wife with pt.

## 2023-10-18 ENCOUNTER — Other Ambulatory Visit: Payer: Self-pay

## 2023-10-18 ENCOUNTER — Emergency Department: Payer: Medicare Other

## 2023-10-18 ENCOUNTER — Encounter: Payer: Self-pay | Admitting: Hematology and Oncology

## 2023-10-18 ENCOUNTER — Emergency Department
Admission: EM | Admit: 2023-10-18 | Discharge: 2023-10-18 | Disposition: A | Discharge status: Home / Self Care | Payer: Medicare Other | Attending: Emergency Medicine | Admitting: Emergency Medicine

## 2023-10-18 DIAGNOSIS — R112 Nausea with vomiting, unspecified: Secondary | ICD-10-CM | POA: Insufficient documentation

## 2023-10-18 DIAGNOSIS — R1084 Generalized abdominal pain: Secondary | ICD-10-CM | POA: Diagnosis not present

## 2023-10-18 LAB — HEPATIC FUNCTION PANEL
ALT: 21 U/L (ref 0–44)
AST: 24 U/L (ref 15–41)
Albumin: 4 g/dL (ref 3.5–5.0)
Alkaline Phosphatase: 66 U/L (ref 38–126)
Bilirubin, Direct: 0.1 mg/dL (ref 0.0–0.2)
Indirect Bilirubin: 0.9 mg/dL (ref 0.3–0.9)
Total Bilirubin: 1 mg/dL (ref <1.2)
Total Protein: 6.7 g/dL (ref 6.5–8.1)

## 2023-10-18 LAB — BASIC METABOLIC PANEL
Anion gap: 9 (ref 5–15)
BUN: 20 mg/dL (ref 8–23)
CO2: 25 mmol/L (ref 22–32)
Calcium: 9.1 mg/dL (ref 8.9–10.3)
Chloride: 104 mmol/L (ref 98–111)
Creatinine, Ser: 1.18 mg/dL (ref 0.61–1.24)
GFR, Estimated: >60 mL/min (ref >60)
Glucose, Bld: 145 mg/dL — ABNORMAL HIGH (ref 70–99)
Potassium: 4.3 mmol/L (ref 3.5–5.1)
Sodium: 138 mmol/L (ref 135–145)

## 2023-10-18 LAB — CBC
HCT: 47.4 % (ref 39.0–52.0)
Hemoglobin: 16.7 g/dL (ref 13.0–17.0)
MCH: 31.9 pg (ref 26.0–34.0)
MCHC: 35.2 g/dL (ref 30.0–36.0)
MCV: 90.5 fL (ref 80.0–100.0)
Platelets: 237 10*3/uL (ref 150–400)
RBC: 5.24 MIL/uL (ref 4.22–5.81)
RDW: 12.3 % (ref 11.5–15.5)
WBC: 9.8 10*3/uL (ref 4.0–10.5)
nRBC: 0 % (ref 0.0–0.2)

## 2023-10-18 LAB — TROPONIN I (HIGH SENSITIVITY)
Troponin I (High Sensitivity): 6 ng/L (ref <18)
Troponin I (High Sensitivity): 6 ng/L (ref <18)

## 2023-10-18 LAB — LIPASE, BLOOD: Lipase: 45 U/L (ref 11–51)

## 2023-10-18 MED ORDER — PANTOPRAZOLE SODIUM 40 MG PO TBEC
40.0000 mg | DELAYED_RELEASE_TABLET | Freq: Once | ORAL | Status: DC
  Filled 2023-10-18: qty 1

## 2023-10-18 MED ORDER — ONDANSETRON HCL 4 MG/2ML IJ SOLN
4.0000 mg | Freq: Once | INTRAMUSCULAR | Status: AC
  Administered 2023-10-18: 4 mg via INTRAVENOUS
  Filled 2023-10-18: qty 2

## 2023-10-18 MED ORDER — ALUM & MAG HYDROXIDE-SIMETH 200-200-20 MG/5ML PO SUSP
30.0000 mL | Freq: Once | ORAL | Status: DC
  Filled 2023-10-18: qty 30

## 2023-10-18 MED ORDER — IOHEXOL 300 MG/ML  SOLN
100.0000 mL | Freq: Once | INTRAMUSCULAR | Status: AC | PRN
  Administered 2023-10-18: 100 mL via INTRAVENOUS

## 2023-10-18 MED ORDER — ONDANSETRON 4 MG PO TBDP: 4.0000 mg | ORAL_TABLET | Freq: Three times a day (TID) | ORAL | 0 refills | Status: AC | PRN

## 2023-10-18 MED ORDER — ONDANSETRON 4 MG PO TBDP: 4.0000 mg | ORAL_TABLET | Freq: Once | ORAL | Status: DC

## 2023-10-18 NOTE — ED Notes (Signed)
Pt incontinent of stool. Pt states "I was vomiting then I felt a rumbling in my stomach. I got up to the toilet and it just exploded." Pt cleaned up and brief placed. Family at bedside

## 2023-10-18 NOTE — ED Notes (Signed)
Pt to CT

## 2023-10-18 NOTE — ED Provider Notes (Signed)
[facility] Provider Note    Event Date/Time   First MD Initiated Contact with Patient 10/18/23 1304     (approximate)   History   Chest Pain   HPI ANTUAN SCHWED is a 72 y.o. male with history of GERD presents today for chest pain.  Patient states he woke up earlier this morning with burning chest pain that felt like severe reflux.  He talked with his wife who is concerned that he may be having a heart attack and gave him nitro.  It supposedly did help with the symptoms.  Currently in the ED, denies chest pain, shortness of breath, abdominal pain, nausea, vomiting, diarrhea, constipation.  Chart review: Recent ED visit over concerns of possible A-fib.  States he is not currently taking the diltiazem anymore as he is not in A-fib.  Never been on blood thinners.    Physical Exam   Triage Vital Signs: ED Triage Vitals  Encounter Vitals Group     BP 10/18/23 1205 117/76     Systolic BP Percentile --      Diastolic BP Percentile --      Pulse Rate 10/18/23 1205 65     Resp 10/18/23 1205 20     Temp 10/18/23 1205 97.6 F (36.4 C)     Temp Source 10/18/23 1205 Oral     SpO2 10/18/23 1205 98 %     Weight --      Height --      Head Circumference --      Peak Flow --      Pain Score 10/18/23 1202 0     Pain Loc --      Pain Education --      Exclude from Growth Chart --     Most recent vital signs: Vitals:   10/18/23 1205  BP: 117/76  Pulse: 65  Resp: 20  Temp: 97.6 F (36.4 C)  SpO2: 98%   Physical Exam: I have reviewed the vital signs and nursing notes. General: Awake, alert, no acute distress.  Nontoxic appearing. Head:  Atraumatic, normocephalic.   ENT:  EOM intact, PERRL. Oral mucosa is pink and moist with no lesions. Neck: Neck is supple with full range of motion, No meningeal signs. Cardiovascular:  RRR, No murmurs. Peripheral pulses palpable and equal bilaterally. Respiratory:  Symmetrical chest wall expansion.  No rhonchi,  rales, or wheezes.  Good air movement throughout.  No use of accessory muscles.   Musculoskeletal:  No cyanosis or edema. Moving extremities with full ROM Abdomen:  Soft, nontender, nondistended. Neuro:  GCS 15, moving all four extremities, interacting appropriately. Speech clear. Psych:  Calm, appropriate.   Skin:  Warm, dry, no rash.    ED Results / Procedures / Treatments   Labs (all labs ordered are listed, but only abnormal results are displayed) Labs Reviewed  BASIC METABOLIC PANEL - Abnormal; Notable for the following components:      Result Value   Glucose, Bld 145 (*)    All other components within normal limits  CBC  LIPASE, BLOOD  HEPATIC FUNCTION PANEL  TROPONIN I (HIGH SENSITIVITY)  TROPONIN I (HIGH SENSITIVITY)     EKG My EKG interpretation: Rate of 69, normal sinus rhythm, normal axis, normal intervals.  No acute ST elevations or depressions   RADIOLOGY Independently interpreted chest x-ray with no acute pathology   PROCEDURES:  Critical Care performed: No  Procedures   MEDICATIONS ORDERED IN ED: Medications  ondansetron (ZOFRAN) injection  4 mg (has no administration in time range)     IMPRESSION / MDM / ASSESSMENT AND PLAN / ED COURSE  I reviewed the triage vital signs and the nursing notes.                              Differential diagnosis includes, but is not limited to, reflux, lower suspicion ACS, pneumonia, pneumothorax, gastritis  Patient's presentation is most consistent with acute complicated illness / injury requiring diagnostic workup.  Patient is a 72 year old male presenting today for brief episode of burning chest pain.  No associated symptoms with it.  On arrival to the ED he is now asymptomatic with unremarkable physical exam and stable vital signs.  Will evaluate for cardiopulmonary abnormalities such as ACS, pneumonia.  Also concerning for reflux with his previous history and possible gastritis.  EKG with no signs of ischemia.   Troponins negative x 2.  CBC, BMP, hepatic function panel, and lipase unremarkable.  Went to reassess patient and he was now noting worsening abdominal pain which was lower down.  Vomited on assessment.  Given new abdominal symptoms, will evaluate with CT abdomen/pelvis for further evaluation.  Patient signed out to oncoming provider pending results of CT.  The patient is on the cardiac monitor to evaluate for evidence of arrhythmia and/or significant heart rate changes. Clinical Course as of 10/18/23 1459  Sat Oct 18, 2023  1446 Went to reassess patient given troponins negative x 2.  Patient now complaining of abdominal pain which is lower down did not previously have this.  He is actively vomiting on exam.  Will get CT abdomen/pelvis to evaluate for any intra-abdominal pathology. [DW]    Clinical Course User Index [DW] Janith Lima, MD     FINAL CLINICAL IMPRESSION(S) / ED DIAGNOSES   Final diagnoses:  Generalized abdominal pain  Nausea and vomiting, unspecified vomiting type     Rx / DC Orders   ED Discharge Orders     None        Note:  This document was prepared using Dragon voice recognition software and may include unintentional dictation errors.   Janith Lima, MD 10/18/23 1500

## 2023-10-18 NOTE — ED Provider Notes (Signed)
Procedures  Clinical Course as of 10/18/23 1759  Sat Oct 18, 2023  1446 Went to reassess patient given troponins negative x 2.  Patient now complaining of abdominal pain which is lower down did not previously have this.  He is actively vomiting on exam.  Will get CT abdomen/pelvis to evaluate for any intra-abdominal pathology. [DW]    Clinical Course User Index [DW] Janith Lima, MD    ----------------------------------------- 5:59 PM on 10/18/2023 ----------------------------------------- Pt feeling better, now tolerating PO. Also now reports that prior to sx onset, he ate some leftover cream dip that his wife had made for an office party and brought back home at the end of the day. Suspect food borne illness vs viral AGE, can be expected to resolve with continued hydration.     Sharman Cheek, MD 10/18/23 1800

## 2023-10-18 NOTE — ED Notes (Signed)
Pt given ginger ale for PO challenge

## 2023-10-18 NOTE — ED Triage Notes (Signed)
Pt to ED via POV from home. Pt reports intermittent centralized CP that started at 7am. +N/V and clammy. Pt reports hx of afib. Pt not on blood thinner. Pt denies SOB. Pt also reports weakness. Pt reports pain is intermittent. Pt denies pain at this time. Pt took 1 nitroglycerin SL with relief.
